# Patient Record
Sex: Male | Born: 1940
Health system: Southern US, Community
[De-identification: ages and names within clinical notes are randomized; demographics above are authoritative.]

## PROBLEM LIST (undated history)

## (undated) DIAGNOSIS — I10 Essential (primary) hypertension: Secondary | ICD-10-CM

## (undated) DIAGNOSIS — K635 Polyp of colon: Secondary | ICD-10-CM

## (undated) DIAGNOSIS — I4510 Unspecified right bundle-branch block: Secondary | ICD-10-CM

## (undated) DIAGNOSIS — N189 Chronic kidney disease, unspecified: Secondary | ICD-10-CM

## (undated) DIAGNOSIS — R0989 Other specified symptoms and signs involving the circulatory and respiratory systems: Secondary | ICD-10-CM

## (undated) DIAGNOSIS — I2510 Atherosclerotic heart disease of native coronary artery without angina pectoris: Secondary | ICD-10-CM

## (undated) DIAGNOSIS — H269 Unspecified cataract: Secondary | ICD-10-CM

## (undated) DIAGNOSIS — M1990 Unspecified osteoarthritis, unspecified site: Secondary | ICD-10-CM

## (undated) DIAGNOSIS — C61 Malignant neoplasm of prostate: Secondary | ICD-10-CM

## (undated) DIAGNOSIS — E785 Hyperlipidemia, unspecified: Secondary | ICD-10-CM

## (undated) HISTORY — DX: Unspecified osteoarthritis, unspecified site: M19.90

## (undated) HISTORY — DX: Unspecified cataract: H26.9

## (undated) HISTORY — DX: Polyp of colon: K63.5

## (undated) HISTORY — DX: Essential (primary) hypertension: I10

## (undated) HISTORY — PX: POLYPECTOMY: SHX149

## (undated) HISTORY — DX: Hyperlipidemia, unspecified: E78.5

## (undated) HISTORY — DX: Atherosclerotic heart disease of native coronary artery without angina pectoris: I25.10

## (undated) HISTORY — PX: COLON SURGERY: SHX602

## (undated) HISTORY — DX: Unspecified right bundle-branch block: I45.10

## (undated) HISTORY — PX: COLONOSCOPY: SHX174

## (undated) HISTORY — PX: VASECTOMY: SHX75

---

## 2001-05-09 ENCOUNTER — Encounter (INDEPENDENT_AMBULATORY_CARE_PROVIDER_SITE_OTHER): Payer: Self-pay

## 2001-05-09 ENCOUNTER — Other Ambulatory Visit: Admission: RE | Admit: 2001-05-09 | Discharge: 2001-05-09 | Payer: Self-pay | Admitting: Gastroenterology

## 2002-03-28 ENCOUNTER — Encounter: Payer: Self-pay | Admitting: Internal Medicine

## 2002-03-28 ENCOUNTER — Encounter: Admission: RE | Admit: 2002-03-28 | Discharge: 2002-03-28 | Payer: Self-pay | Admitting: Internal Medicine

## 2003-11-24 HISTORY — PX: INGUINAL HERNIA REPAIR: SUR1180

## 2004-05-14 ENCOUNTER — Ambulatory Visit (HOSPITAL_BASED_OUTPATIENT_CLINIC_OR_DEPARTMENT_OTHER): Admission: RE | Admit: 2004-05-14 | Discharge: 2004-05-14 | Payer: Self-pay | Admitting: General Surgery

## 2004-05-14 ENCOUNTER — Ambulatory Visit (HOSPITAL_COMMUNITY): Admission: RE | Admit: 2004-05-14 | Discharge: 2004-05-14 | Payer: Self-pay | Admitting: General Surgery

## 2005-04-21 ENCOUNTER — Ambulatory Visit: Payer: Self-pay | Admitting: Internal Medicine

## 2005-04-27 ENCOUNTER — Ambulatory Visit: Payer: Self-pay | Admitting: Internal Medicine

## 2006-01-01 ENCOUNTER — Ambulatory Visit: Payer: Self-pay | Admitting: Internal Medicine

## 2006-07-07 ENCOUNTER — Ambulatory Visit: Payer: Self-pay | Admitting: Internal Medicine

## 2006-10-20 ENCOUNTER — Ambulatory Visit: Payer: Self-pay | Admitting: Internal Medicine

## 2007-03-28 ENCOUNTER — Ambulatory Visit: Payer: Self-pay | Admitting: Internal Medicine

## 2007-04-13 ENCOUNTER — Ambulatory Visit: Payer: Self-pay | Admitting: Gastroenterology

## 2007-04-27 ENCOUNTER — Ambulatory Visit: Payer: Self-pay | Admitting: Gastroenterology

## 2007-04-27 ENCOUNTER — Encounter: Payer: Self-pay | Admitting: Gastroenterology

## 2007-07-05 ENCOUNTER — Ambulatory Visit: Payer: Self-pay | Admitting: Gastroenterology

## 2007-07-20 ENCOUNTER — Encounter: Payer: Self-pay | Admitting: Gastroenterology

## 2007-07-20 ENCOUNTER — Ambulatory Visit: Payer: Self-pay | Admitting: Gastroenterology

## 2007-08-09 ENCOUNTER — Ambulatory Visit: Payer: Self-pay | Admitting: Internal Medicine

## 2007-08-09 LAB — CONVERTED CEMR LAB
ALT: 21 units/L (ref 0–53)
AST: 26 units/L (ref 0–37)
Albumin: 4 g/dL (ref 3.5–5.2)
Alkaline Phosphatase: 61 units/L (ref 39–117)
BUN: 23 mg/dL (ref 6–23)
Basophils Absolute: 0 10*3/uL (ref 0.0–0.1)
Basophils Relative: 0.5 % (ref 0.0–1.0)
Bilirubin Urine: NEGATIVE
Bilirubin, Direct: 0.1 mg/dL (ref 0.0–0.3)
CO2: 30 meq/L (ref 19–32)
Calcium: 9.6 mg/dL (ref 8.4–10.5)
Chloride: 108 meq/L (ref 96–112)
Cholesterol: 207 mg/dL (ref 0–200)
Creatinine, Ser: 1.1 mg/dL (ref 0.4–1.5)
Direct LDL: 114.4 mg/dL
Eosinophils Absolute: 0.2 10*3/uL (ref 0.0–0.6)
Eosinophils Relative: 2.2 % (ref 0.0–5.0)
GFR calc Af Amer: 86 mL/min
GFR calc non Af Amer: 71 mL/min
Glucose, Bld: 88 mg/dL (ref 70–99)
HCT: 39.8 % (ref 39.0–52.0)
HDL: 73.3 mg/dL (ref >39.0)
Hemoglobin, Urine: NEGATIVE
Hemoglobin: 13.8 g/dL (ref 13.0–17.0)
Ketones, ur: NEGATIVE mg/dL
Leukocytes, UA: NEGATIVE
Lymphocytes Relative: 20.3 % (ref 12.0–46.0)
MCHC: 34.6 g/dL (ref 30.0–36.0)
MCV: 92.7 fL (ref 78.0–100.0)
Monocytes Absolute: 0.6 10*3/uL (ref 0.2–0.7)
Monocytes Relative: 8.2 % (ref 3.0–11.0)
Neutro Abs: 5.5 10*3/uL (ref 1.4–7.7)
Neutrophils Relative %: 68.8 % (ref 43.0–77.0)
Nitrite: NEGATIVE
PSA: 1.44 ng/mL (ref 0.10–4.00)
Platelets: 221 10*3/uL (ref 150–400)
Potassium: 4.6 meq/L (ref 3.5–5.1)
RBC: 4.29 M/uL (ref 4.22–5.81)
RDW: 11.6 % (ref 11.5–14.6)
Sodium: 144 meq/L (ref 135–145)
Specific Gravity, Urine: >=1.03 (ref 1.000–1.03)
TSH: 0.5 microintl units/mL (ref 0.35–5.50)
Total Bilirubin: 1 mg/dL (ref 0.3–1.2)
Total CHOL/HDL Ratio: 2.8
Total Protein, Urine: NEGATIVE mg/dL
Total Protein: 7.1 g/dL (ref 6.0–8.3)
Triglycerides: 51 mg/dL (ref 0–149)
Urine Glucose: NEGATIVE mg/dL
Urobilinogen, UA: 0.2 (ref 0.0–1.0)
VLDL: 10 mg/dL (ref 0–40)
Vit D, 1,25-Dihydroxy: 53 (ref 20–57)
WBC: 7.9 10*3/uL (ref 4.5–10.5)
pH: 5.5 (ref 5.0–8.0)

## 2007-12-13 ENCOUNTER — Ambulatory Visit: Payer: Self-pay | Admitting: Gastroenterology

## 2007-12-26 ENCOUNTER — Encounter: Payer: Self-pay | Admitting: Gastroenterology

## 2007-12-26 ENCOUNTER — Ambulatory Visit: Payer: Self-pay | Admitting: Gastroenterology

## 2008-08-09 ENCOUNTER — Ambulatory Visit: Payer: Self-pay | Admitting: Internal Medicine

## 2008-08-09 DIAGNOSIS — I10 Essential (primary) hypertension: Secondary | ICD-10-CM

## 2008-08-09 DIAGNOSIS — E785 Hyperlipidemia, unspecified: Secondary | ICD-10-CM

## 2008-10-11 ENCOUNTER — Telehealth: Payer: Self-pay | Admitting: Gastroenterology

## 2008-12-13 ENCOUNTER — Ambulatory Visit: Payer: Self-pay | Admitting: Gastroenterology

## 2008-12-26 ENCOUNTER — Ambulatory Visit: Payer: Self-pay | Admitting: Gastroenterology

## 2008-12-26 ENCOUNTER — Encounter: Payer: Self-pay | Admitting: Gastroenterology

## 2008-12-26 LAB — HM COLONOSCOPY

## 2008-12-27 ENCOUNTER — Encounter: Payer: Self-pay | Admitting: Gastroenterology

## 2009-02-05 ENCOUNTER — Ambulatory Visit: Payer: Self-pay | Admitting: Internal Medicine

## 2009-02-05 DIAGNOSIS — M79609 Pain in unspecified limb: Secondary | ICD-10-CM

## 2009-08-20 ENCOUNTER — Ambulatory Visit: Payer: Self-pay | Admitting: Internal Medicine

## 2009-08-20 DIAGNOSIS — N509 Disorder of male genital organs, unspecified: Secondary | ICD-10-CM | POA: Insufficient documentation

## 2009-09-09 ENCOUNTER — Telehealth: Payer: Self-pay | Admitting: Internal Medicine

## 2009-09-19 ENCOUNTER — Ambulatory Visit: Payer: Self-pay | Admitting: Internal Medicine

## 2009-09-19 LAB — CONVERTED CEMR LAB
BUN: 15 mg/dL (ref 6–23)
CO2: 29 meq/L (ref 19–32)
Calcium: 9.1 mg/dL (ref 8.4–10.5)
Chloride: 101 meq/L (ref 96–112)
Creatinine, Ser: 1.2 mg/dL (ref 0.4–1.5)
GFR calc non Af Amer: 63.94 mL/min (ref >60)
Glucose, Bld: 90 mg/dL (ref 70–99)
Potassium: 4.6 meq/L (ref 3.5–5.1)
Sodium: 136 meq/L (ref 135–145)
Vit D, 25-Hydroxy: 46 ng/mL (ref 30–89)

## 2010-02-18 ENCOUNTER — Ambulatory Visit: Payer: Self-pay | Admitting: Internal Medicine

## 2010-02-18 DIAGNOSIS — M1990 Unspecified osteoarthritis, unspecified site: Secondary | ICD-10-CM

## 2010-02-18 DIAGNOSIS — Z87891 Personal history of nicotine dependence: Secondary | ICD-10-CM

## 2010-08-21 ENCOUNTER — Ambulatory Visit: Payer: Self-pay | Admitting: Internal Medicine

## 2010-08-21 ENCOUNTER — Encounter: Payer: Self-pay | Admitting: Internal Medicine

## 2010-08-21 DIAGNOSIS — D485 Neoplasm of uncertain behavior of skin: Secondary | ICD-10-CM

## 2010-09-04 ENCOUNTER — Ambulatory Visit: Payer: Self-pay | Admitting: Internal Medicine

## 2010-09-04 LAB — CONVERTED CEMR LAB
ALT: 25 units/L (ref 0–53)
AST: 29 units/L (ref 0–37)
Albumin: 4.1 g/dL (ref 3.5–5.2)
Alkaline Phosphatase: 67 units/L (ref 39–117)
BUN: 24 mg/dL — ABNORMAL HIGH (ref 6–23)
Basophils Absolute: 0 10*3/uL (ref 0.0–0.1)
Basophils Relative: 0.2 % (ref 0.0–3.0)
Bilirubin Urine: NEGATIVE
Bilirubin, Direct: 0.1 mg/dL (ref 0.0–0.3)
CO2: 31 meq/L (ref 19–32)
Calcium: 9.7 mg/dL (ref 8.4–10.5)
Chloride: 104 meq/L (ref 96–112)
Cholesterol: 192 mg/dL (ref 0–200)
Creatinine, Ser: 1.4 mg/dL (ref 0.4–1.5)
Eosinophils Absolute: 0.1 10*3/uL (ref 0.0–0.7)
Eosinophils Relative: 1.8 % (ref 0.0–5.0)
GFR calc non Af Amer: 51.67 mL/min (ref >60)
Glucose, Bld: 94 mg/dL (ref 70–99)
HCT: 41 % (ref 39.0–52.0)
HDL: 83.1 mg/dL (ref >39.00)
Hemoglobin, Urine: NEGATIVE
Hemoglobin: 14.1 g/dL (ref 13.0–17.0)
Ketones, ur: NEGATIVE mg/dL
LDL Cholesterol: 101 mg/dL — ABNORMAL HIGH (ref 0–99)
Leukocytes, UA: NEGATIVE
Lymphocytes Relative: 18.5 % (ref 12.0–46.0)
Lymphs Abs: 1.4 10*3/uL (ref 0.7–4.0)
MCHC: 34.5 g/dL (ref 30.0–36.0)
MCV: 95 fL (ref 78.0–100.0)
Monocytes Absolute: 0.6 10*3/uL (ref 0.1–1.0)
Monocytes Relative: 8.6 % (ref 3.0–12.0)
Neutro Abs: 5.3 10*3/uL (ref 1.4–7.7)
Neutrophils Relative %: 70.9 % (ref 43.0–77.0)
Nitrite: NEGATIVE
PSA: 2.48 ng/mL (ref 0.10–4.00)
Platelets: 184 10*3/uL (ref 150.0–400.0)
Potassium: 5.6 meq/L — ABNORMAL HIGH (ref 3.5–5.1)
RBC: 4.32 M/uL (ref 4.22–5.81)
RDW: 12.7 % (ref 11.5–14.6)
Sodium: 139 meq/L (ref 135–145)
Specific Gravity, Urine: 1.02 (ref 1.000–1.030)
TSH: 0.52 microintl units/mL (ref 0.35–5.50)
Total Bilirubin: 0.7 mg/dL (ref 0.3–1.2)
Total CHOL/HDL Ratio: 2
Total Protein, Urine: NEGATIVE mg/dL
Total Protein: 6.9 g/dL (ref 6.0–8.3)
Triglycerides: 39 mg/dL (ref 0.0–149.0)
Urine Glucose: NEGATIVE mg/dL
Urobilinogen, UA: 0.2 (ref 0.0–1.0)
VLDL: 7.8 mg/dL (ref 0.0–40.0)
WBC: 7.5 10*3/uL (ref 4.5–10.5)
pH: 5.5 (ref 5.0–8.0)

## 2010-12-21 LAB — CONVERTED CEMR LAB
ALT: 22 units/L (ref 0–53)
ALT: 23 units/L (ref 0–53)
AST: 29 units/L (ref 0–37)
AST: 30 units/L (ref 0–37)
Albumin: 3.9 g/dL (ref 3.5–5.2)
Albumin: 4 g/dL (ref 3.5–5.2)
Alkaline Phosphatase: 65 units/L (ref 39–117)
Alkaline Phosphatase: 66 units/L (ref 39–117)
BUN: 17 mg/dL (ref 6–23)
BUN: 18 mg/dL (ref 6–23)
Basophils Absolute: 0 10*3/uL (ref 0.0–0.1)
Basophils Relative: 0 % (ref 0.0–3.0)
Basophils Relative: 0.7 % (ref 0.0–3.0)
Bilirubin Urine: NEGATIVE
Bilirubin Urine: NEGATIVE
Bilirubin, Direct: 0.1 mg/dL (ref 0.0–0.3)
Bilirubin, Direct: 0.2 mg/dL (ref 0.0–0.3)
CO2: 28 meq/L (ref 19–32)
CO2: 32 meq/L (ref 19–32)
Calcium: 9.6 mg/dL (ref 8.4–10.5)
Calcium: 9.6 mg/dL (ref 8.4–10.5)
Chloride: 105 meq/L (ref 96–112)
Chloride: 109 meq/L (ref 96–112)
Cholesterol: 190 mg/dL (ref 0–200)
Cholesterol: 192 mg/dL (ref 0–200)
Creatinine, Ser: 1.1 mg/dL (ref 0.4–1.5)
Creatinine, Ser: 1.2 mg/dL (ref 0.4–1.5)
Eosinophils Absolute: 0.2 10*3/uL (ref 0.0–0.7)
Eosinophils Relative: 2.7 % (ref 0.0–5.0)
Eosinophils Relative: 4.4 % (ref 0.0–5.0)
GFR calc Af Amer: 86 mL/min
GFR calc non Af Amer: 63.96 mL/min (ref >60)
GFR calc non Af Amer: 71 mL/min
Glucose, Bld: 101 mg/dL — ABNORMAL HIGH (ref 70–99)
Glucose, Bld: 103 mg/dL — ABNORMAL HIGH (ref 70–99)
HCT: 40.3 % (ref 39.0–52.0)
HCT: 40.9 % (ref 39.0–52.0)
HDL: 74.9 mg/dL (ref >39.00)
HDL: 75.6 mg/dL (ref >39.0)
Hemoglobin, Urine: NEGATIVE
Hemoglobin, Urine: NEGATIVE
Hemoglobin: 13.7 g/dL (ref 13.0–17.0)
Hemoglobin: 14.3 g/dL (ref 13.0–17.0)
Ketones, ur: NEGATIVE mg/dL
Ketones, ur: NEGATIVE mg/dL
LDL Cholesterol: 106 mg/dL — ABNORMAL HIGH (ref 0–99)
LDL Cholesterol: 108 mg/dL — ABNORMAL HIGH (ref 0–99)
Leukocytes, UA: NEGATIVE
Leukocytes, UA: NEGATIVE
Lymphocytes Relative: 19.9 % (ref 12.0–46.0)
Lymphocytes Relative: 20.3 % (ref 12.0–46.0)
MCHC: 34 g/dL (ref 30.0–36.0)
MCHC: 34.9 g/dL (ref 30.0–36.0)
MCV: 93.7 fL (ref 78.0–100.0)
MCV: 94 fL (ref 78.0–100.0)
Monocytes Absolute: 0.5 10*3/uL (ref 0.1–1.0)
Monocytes Relative: 11.1 % (ref 3.0–12.0)
Monocytes Relative: 6.8 % (ref 3.0–12.0)
Neutro Abs: 4.8 10*3/uL (ref 1.4–7.7)
Neutrophils Relative %: 63.9 % (ref 43.0–77.0)
Neutrophils Relative %: 70.2 % (ref 43.0–77.0)
Nitrite: NEGATIVE
Nitrite: NEGATIVE
PSA: 1.69 ng/mL (ref 0.10–4.00)
PSA: 1.76 ng/mL (ref 0.10–4.00)
Platelets: 182 10*3/uL (ref 150.0–400.0)
Platelets: 182 10*3/uL (ref 150–400)
Potassium: 4.8 meq/L (ref 3.5–5.1)
Potassium: 5.3 meq/L — ABNORMAL HIGH (ref 3.5–5.1)
RBC: 4.29 M/uL (ref 4.22–5.81)
RBC: 4.37 M/uL (ref 4.22–5.81)
RDW: 11.9 % (ref 11.5–14.6)
RDW: 12.2 % (ref 11.5–14.6)
Sodium: 140 meq/L (ref 135–145)
Sodium: 143 meq/L (ref 135–145)
Specific Gravity, Urine: 1.02 (ref 1.000–1.030)
Specific Gravity, Urine: 1.025 (ref 1.000–1.03)
TSH: 0.55 microintl units/mL (ref 0.35–5.50)
TSH: 0.6 microintl units/mL (ref 0.35–5.50)
Total Bilirubin: 0.8 mg/dL (ref 0.3–1.2)
Total Bilirubin: 1 mg/dL (ref 0.3–1.2)
Total CHOL/HDL Ratio: 2.5
Total CHOL/HDL Ratio: 3
Total Protein, Urine: NEGATIVE mg/dL
Total Protein, Urine: NEGATIVE mg/dL
Total Protein: 7.2 g/dL (ref 6.0–8.3)
Total Protein: 7.3 g/dL (ref 6.0–8.3)
Triglycerides: 43 mg/dL (ref 0–149)
Triglycerides: 47 mg/dL (ref 0.0–149.0)
Urine Glucose: NEGATIVE mg/dL
Urine Glucose: NEGATIVE mg/dL
Urobilinogen, UA: 0.2 (ref 0.0–1.0)
Urobilinogen, UA: 0.2 (ref 0.0–1.0)
VLDL: 9 mg/dL (ref 0–40)
VLDL: 9.4 mg/dL (ref 0.0–40.0)
WBC: 6.3 10*3/uL (ref 4.5–10.5)
WBC: 6.9 10*3/uL (ref 4.5–10.5)
pH: 5 (ref 5.0–8.0)
pH: 5 (ref 5.0–8.0)

## 2010-12-23 NOTE — Miscellaneous (Signed)
Summary: Skin Bx & mole removal/[facility]  Skin Bx & mole removal/[facility]   Imported By: Phillis Knack 09/05/2010 14:06:51  _____________________________________________________________________  External Attachment:    Type:   Image     Comment:   External Document

## 2010-12-23 NOTE — Assessment & Plan Note (Signed)
Summary: SKIN BX/NWS   Vital Signs:  Patient profile:   70 year old male Height:      65 inches Weight:      145 pounds BMI:     24.22 Temp:     97.5 degrees F oral Pulse rate:   72 / minute Pulse rhythm:   regular Resp:     16 per minute BP sitting:   102 / 70  (left arm) Cuff size:   regular  Vitals Entered By: Jonathon Resides, Gabrielle Dare) (September 04, 2010 9:29 AM)  Procedure Note Last Tetanus: Tdap (08/21/2010)  Mole Biopsy/Removal: The patient complains of irritation and inflammation. Indication: changing lesion Consent signed: yes  Procedure # 1: shave biopsy    Size (in cm): 0.6 x 0.5    Region: lateral    Location: L temple (#3)    Comment: Risks including but not limited by incomplete procedure, bleeding, infection, recurrence were discussed with the patient. Consent form was signed.     Instrument used: dermablade    Anesthesia: 0.5 ml 1% lidocaine w/epinephrine  Procedure # 2: shave biopsy    Size (in cm): 1.1 x 0.6    Region: lateral    Location: R chest (#1)    Comment: Another three 6x4 mm moles were removed from R chest in the identical fasion. Tolerated well. Complicatons - none. #1,2,3  sent to lab. #2 was above #1    Instrument used: same    Anesthesia: 1.0 ml 1% lidocaine w/epinephrine  Cleaned and prepped with: alcohol and betadine Wound dressing: neosporin and bandaid Instructions: daily dressing changes  CC: multiple moles Is Patient Diabetic? No   CC:  multiple moles.  History of Present Illness: Skin biopsy   Current Medications (verified): 1)  Lisinopril 20 Mg Tabs (Lisinopril) .... Once Daily 2)  Lovastatin 40 Mg Tabs (Lovastatin) .... Once Daily 3)  Aspirin 81 Mg  Tbec (Aspirin) .... One By Mouth Every Day 4)  Vitamin D 2000 Unit Caps (Cholecalciferol) .Marland Kitchen.. 1 By Mouth Once Daily 5)  Cialis 20 Mg Tabs (Tadalafil) .Marland Kitchen.. 1 Tablet Every Other Day As Needed 6)  Fish Oil  Oil (Fish Oil) .... Once Daily  Allergies (verified): 1)  !  Wellbutrin   Impression & Recommendations:  Problem # 1:  NEOPLASM OF UNCERTAIN BEHAVIOR OF SKIN (ICD-238.2) Assessment Deteriorated  Orders: Shave Skin Lesion 0.6-1.0cm face/ears/eyelids/nose/lips/mm (11311) Shave Skin Lesion 1.1-2.0 cm/trunk/arm/leg [id number]) Shave Skin Lesion 0.6-1.0 cm/trunk/arm/leg (11301) Shave Skin Lesion 0.6-1.0 cm/trunk/arm/leg (11301) Shave Skin Lesion 0.6-1.0 cm/trunk/arm/leg (11301)  Complete Medication List: 1)  Lisinopril 20 Mg Tabs (Lisinopril) .... Once daily 2)  Lovastatin 40 Mg Tabs (Lovastatin) .... Once daily 3)  Aspirin 81 Mg Tbec (Aspirin) .... One by mouth every day 4)  Vitamin D 2000 Unit Caps (Cholecalciferol) .Marland Kitchen.. 1 by mouth once daily 5)  Cialis 20 Mg Tabs (Tadalafil) .Marland Kitchen.. 1 tablet every other day as needed 6)  Fish Oil Oil (Fish oil) .... Once daily  Other Orders: Pneumococcal Vaccine [id number]) Admin 1st Vaccine [phone])   Immunizations Administered:  Pneumonia Vaccine:    Vaccine Type: Pneumovax    Site: right deltoid    Mfr: Merck    Dose: 0.5 ml    Route: IM    Given by: Jonathon Resides, CMA(AAMA)    Exp. Date: 02/09/2012    Lot #: [id number]    VIS given: 10/28/09 version given September 04, 2010.

## 2010-12-23 NOTE — Assessment & Plan Note (Signed)
Summary: 6 MTH FU  #--STC   Vital Signs:  Patient profile:   70 year old male Height:      65 inches Weight:      148 pounds BMI:     24.72 Temp:     96.4 degrees F oral Pulse rate:   58 / minute BP sitting:   116 / 74  (left arm)  Vitals Entered By: Doralee Albino (February 18, 2010 8:58 AM) CC: f/u Is Patient Diabetic? No   CC:  f/u.  History of Present Illness: F/u HTN, hyperlipidemia, ED. Knee pain is better. Testic pain has reoccured - mild - several times.  Preventive Screening-Counseling & Management  Alcohol-Tobacco     Smoking Status: quit  Current Medications (verified): 1)  Lisinopril 20 Mg Tabs (Lisinopril) .... Once Daily 2)  Lovastatin 40 Mg Tabs (Lovastatin) .... Once Daily 3)  Aspirin 81 Mg  Tbec (Aspirin) .... One By Mouth Every Day 4)  Vitamin D3 1000 Unit  Tabs (Cholecalciferol) .Marland Kitchen.. 1 By Mouth Daily 5)  Cialis 20 Mg Tabs (Tadalafil) .Marland Kitchen.. 1 Tablet Every Other Day As Needed 6)  Fish Oil  Oil (Fish Oil) .... Once Daily  Allergies: 1)  ! Wellbutrin  Past History:  Social History: Last updated: 08/20/2009 Married Former Smoker Alcohol use-yes Regular exercise-yes - golf  Past Medical History: Hyperlipidemia Hypertension Osteoarthritis  Review of Systems  The patient denies fever, chest pain, and abdominal pain.    Physical Exam  General:  Well-developed,well-nourished,in no acute distress; alert,appropriate and cooperative throughout examination Nose:  External nasal examination shows no deformity or inflammation. Nasal mucosa are pink and moist without lesions or exudates. Mouth:  Oral mucosa and oropharynx without lesions or exudates.  Teeth in good repair. Neck:  No deformities, masses, or tenderness noted. Lungs:  Normal respiratory effort, chest expands symmetrically. Lungs are clear to auscultation, no crackles or wheezes. Heart:  Normal rate and regular rhythm. S1 and S2 normal without gallop, murmur, click, rub or other extra  sounds. Abdomen:  Bowel sounds positive,abdomen soft and non-tender without masses, organomegaly. Mild R inguinal  hernia noted. Msk:  No deformity or scoliosis noted of thoracic or lumbar spine.   Neurologic:  No cranial nerve deficits noted. Station and gait are normal. Plantar reflexes are down-going bilaterally. DTRs are symmetrical throughout. Sensory, motor and coordinative functions appear intact. Psych:  Cognition and judgment appear intact. Alert and cooperative with normal attention span and concentration. No apparent delusions, illusions, hallucinations   Impression & Recommendations:  Problem # 1:  HYPERTENSION (ICD-401.9) Assessment Improved  His updated medication list for this problem includes:    Lisinopril 20 Mg Tabs (Lisinopril) ..... Once daily  Problem # 2:  HYPERLIPIDEMIA (ICD-272.4) Assessment: Improved  His updated medication list for this problem includes:    Lovastatin 40 Mg Tabs (Lovastatin) ..... Once daily  Problem # 3:  TESTICULAR PAIN, RIGHT (ICD-608.9) ?? epididimitis Assessment: Improved Cipro if reccures  Complete Medication List: 1)  Lisinopril 20 Mg Tabs (Lisinopril) .... Once daily 2)  Lovastatin 40 Mg Tabs (Lovastatin) .... Once daily 3)  Aspirin 81 Mg Tbec (Aspirin) .... One by mouth every day 4)  Vitamin D3 1000 Unit Tabs (Cholecalciferol) .Marland Kitchen.. 1 by mouth daily 5)  Cialis 20 Mg Tabs (Tadalafil) .Marland Kitchen.. 1 tablet every other day as needed 6)  Fish Oil Oil (Fish oil) .... Once daily 7)  Ciprofloxacin Hcl 500 Mg Tabs (Ciprofloxacin hcl) .Marland Kitchen.. 1 by mouth bid  Patient Instructions: 1)  Please  schedule a follow-up appointment in 6 months well w/labs. Prescriptions: CIPROFLOXACIN HCL 500 MG TABS (CIPROFLOXACIN HCL) 1 by mouth bid  #20 x 0   Entered and Authorized by:   Cassandria Anger MD   Signed by:   Cassandria Anger MD on 02/18/2010   Method used:   Print then Give to Patient   RxID:   [id number]

## 2010-12-23 NOTE — Assessment & Plan Note (Signed)
Summary: 6 MTH YEARLY--STC   Vital Signs:  Patient profile:   70 year old male Height:      65 inches Weight:      142.50 pounds O2 Sat:      99 % on Room air Temp:     96.6 degrees F oral Pulse rate:   65 / minute BP sitting:   112 / 70  (left arm)  Vitals Entered By: patty berrocal/intern  O2 Flow:  Room air CC: physical   CC:  physical.  History of Present Illness: The patient presents for a preventive health examination  C/o moles on R chest and L temple dog is waking him up 10-15/night wife thinks he could have OSA Overall doing well, age appropriate education and counseling updated and referral for appropriate preventive services done unless declined, immunizations up to date or declined, diet counseling done if overweight, urged to quit smoking if smokes, most recent labs reviewed and current ordered if appropriate, ecg reviewed or declined (interpretation per ECG scanned in the EMR if done); information regarding Medicare Preventation requirements given if appropriate.   Preventive Screening-Counseling & Management  Alcohol-Tobacco     Alcohol drinks/day: 0  Caffeine-Diet-Exercise     Caffeine Counseling: not indicated; caffeine use is not excessive or problematic     Diet Counseling: not indicated; diet is assessed to be healthy     Exercise Counseling: not indicated; exercise is adequate     Depression Counseling: not indicated; screening negative for depression  Hep-HIV-STD-Contraception     Hepatitis Risk: no risk noted     Sun Exposure-Excessive: no  Safety-Violence-Falls     Seat Belt Use: yes      Sexual History:  currently monogamous.    Allergies: 1)  ! Wellbutrin  Past History:  Past Medical History: Last updated: 02/18/2010 Hyperlipidemia Hypertension Osteoarthritis  Past Surgical History: Last updated: 08/20/2009 Inguinal herniorrhaphy R 2005  Family History: Last updated: 08/09/2008 Family History Hypertension  Social  History: Last updated: 08/20/2009 Married Former Smoker Alcohol use-yes Regular exercise-yes - golf  Social History: Hepatitis Risk:  no risk noted Sun Exposure-Excessive:  no Seat Belt Use:  yes Sexual History:  currently monogamous  Review of Systems       The patient complains of suspicious skin lesions.  The patient denies anorexia, fever, weight loss, weight gain, vision loss, decreased hearing, hoarseness, chest pain, syncope, dyspnea on exertion, peripheral edema, prolonged cough, headaches, hemoptysis, abdominal pain, melena, hematochezia, severe indigestion/heartburn, hematuria, incontinence, genital sores, muscle weakness, transient blindness, difficulty walking, depression, unusual weight change, abnormal bleeding, enlarged lymph nodes, angioedema, and testicular masses.    Physical Exam  General:  Well-developed,well-nourished,in no acute distress; alert,appropriate and cooperative throughout examination Head:  Normocephalic and atraumatic without obvious abnormalities. No apparent alopecia or balding. Eyes:  No corneal or conjunctival inflammation noted. EOMI. Perrla. Funduscopic exam benign, without hemorrhages, exudates or papilledema. Vision grossly normal. Ears:  External ear exam shows no significant lesions or deformities.  Otoscopic examination reveals clear canals, tympanic membranes are intact bilaterally without bulging, retraction, inflammation or discharge. Hearing is grossly normal bilaterally. Nose:  External nasal examination shows no deformity or inflammation. Nasal mucosa are pink and moist without lesions or exudates. Mouth:  Oral mucosa and oropharynx without lesions or exudates.  Teeth in good repair. Neck:  No deformities, masses, or tenderness noted. Chest Wall:  No deformities, masses, tenderness or gynecomastia noted. Lungs:  Normal respiratory effort, chest expands symmetrically. Lungs are clear to auscultation, no  crackles or wheezes. Heart:  Normal  rate and regular rhythm. S1 and S2 normal without gallop, murmur, click, rub or other extra sounds. Abdomen:  Bowel sounds positive,abdomen soft and non-tender without masses, organomegaly. Mild R inguinal  hernia noted. Rectal:  No external abnormalities noted. Normal sphincter tone. No rectal masses or tenderness. G(-) Genitalia:  Testes bilaterally descended without nodularity, tenderness or masses. No scrotal masses or lesions. No penis lesions or urethral discharge. Prostate:  1+ enlarged R side is bigger; no nodules.   Msk:  No deformity or scoliosis noted of thoracic or lumbar spine.   Extremities:  No clubbing, cyanosis, edema, or deformity noted with normal full range of motion of all joints.   Neurologic:  No cranial nerve deficits noted. Station and gait are normal. Plantar reflexes are down-going bilaterally. DTRs are symmetrical throughout. Sensory, motor and coordinative functions appear intact. Skin:  3 moles on R chest and 1 on L temple Cervical Nodes:  No lymphadenopathy noted Psych:  Cognition and judgment appear intact. Alert and cooperative with normal attention span and concentration. No apparent delusions, illusions, hallucinations   Impression & Recommendations:  Problem # 1:  WELL ADULT EXAM (ICD-V70.0) Assessment New Colon due next year Orders: TLB-BMP (Basic Metabolic Panel-BMET) (99991111) TLB-CBC Platelet - w/Differential (85025-CBCD) TLB-Hepatic/Liver Function Pnl (80076-HEPATIC) TLB-Lipid Panel (80061-LIPID) TLB-TSH (Thyroid Stimulating Hormone) (84443-TSH) TLB-Udip ONLY (81003-UDIP) TLB-PSA (Prostate Specific Antigen) (84153-PSA) Sleep test offered Overall doing well, age appropriate education and counseling updated and referral for appropriate preventive services done unless declined, immunizations up to date or declined, diet counseling done if overweight, urged to quit smoking if smokes, most recent labs reviewed and current ordered if appropriate,  ecg reviewed or declined (interpretation per ECG scanned in the EMR if done); information regarding Medicare Preventation requirements given if appropriate.   Problem # 2:  NEOPLASM OF UNCERTAIN BEHAVIOR OF SKIN (ICD-238.2) R chest x3 and L temple x1 Assessment: New Skin biopsy to sch  Problem # 3:  HYPERLIPIDEMIA (ICD-272.4) Assessment: Comment Only  His updated medication list for this problem includes:    Lovastatin 40 Mg Tabs (Lovastatin) ..... Once daily  Complete Medication List: 1)  Lisinopril 20 Mg Tabs (Lisinopril) .... Once daily 2)  Lovastatin 40 Mg Tabs (Lovastatin) .... Once daily 3)  Aspirin 81 Mg Tbec (Aspirin) .... One by mouth every day 4)  Vitamin D3 1000 Unit Tabs (Cholecalciferol) .Marland Kitchen.. 1 by mouth daily 5)  Cialis 20 Mg Tabs (Tadalafil) .Marland Kitchen.. 1 tablet every other day as needed 6)  Fish Oil Oil (Fish oil) .... Once daily  Other Orders: Flu Vaccine 61yrs + MEDICARE PATIENTS [id number]) Administration Flu vaccine - MCR (G0008) Tdap => 25yrs IM [id number]) Admin 1st Vaccine [id number])  Flu Vaccine 30yrs + MEDICARE PATIENTS [id number]) Administration Flu vaccine - MCR [id number])  Patient Instructions: 1)  Sch skin biopsy w/me 2)  Please schedule a follow-up appointment in 6 months. Prescriptions: CIALIS 20 MG TABS (TADALAFIL) 1 tablet every other day as needed  #12 x 6   Entered and Authorized by:   Cassandria Anger MD   Signed by:   Cassandria Anger MD on 08/21/2010   Method used:   Print then Give to Patient   RxID:   [id number] LOVASTATIN 40 MG TABS (LOVASTATIN) once daily  #90 x 3   Entered and Authorized by:   Cassandria Anger MD   Signed by:   Cassandria Anger MD on 08/21/2010   Method used:   Print then  Give to Patient   RxID:   [id number] LISINOPRIL 20 MG TABS (LISINOPRIL) once daily  #90 x 3   Entered and Authorized by:   Cassandria Anger MD   Signed by:   Cassandria Anger MD on 08/21/2010   Method used:   Print then Give to Patient    [id number]  .lbmedflu     Immunizations Administered:  Tetanus Vaccine:    Vaccine Type: Tdap    Site: left deltoid    Mfr: GlaxoSmithKline    Dose: 0.5 ml    Route: IM    Given by: Jonathon Resides, CMA(AAMA)    Exp. Date: 08/13/2012    Lot #: [id number]    VIS given: 10/10/08 version given August 21, 2010. Flu Vaccine Consent Questions     Do you have a history of severe allergic reactions to this vaccine? no    Any prior history of allergic reactions to egg and/or gelatin? no    Do you have a sensitivity to the preservative Thimersol? no    Do you have a past history of Guillan-Barre Syndrome? no    Do you currently have an acute febrile illness? no    Have you ever had a severe reaction to latex? no    Vaccine information given and explained to patient? yes    Are you currently pregnant? no    Lot Number:[id number]   Exp Date:05/23/2011   Site Given  Right Deltoid IM Jonathon Resides, [facility])  August 21, 2010 9:19 AM

## 2011-01-14 ENCOUNTER — Ambulatory Visit (INDEPENDENT_AMBULATORY_CARE_PROVIDER_SITE_OTHER)
Admission: RE | Admit: 2011-01-14 | Discharge: 2011-01-14 | Disposition: A | Discharge status: Home / Self Care | Payer: Medicare HMO | Source: Ambulatory Visit | Attending: Internal Medicine | Admitting: Internal Medicine

## 2011-01-14 ENCOUNTER — Encounter: Payer: Self-pay | Admitting: Internal Medicine

## 2011-01-14 ENCOUNTER — Other Ambulatory Visit: Payer: Self-pay | Admitting: Internal Medicine

## 2011-01-14 ENCOUNTER — Ambulatory Visit (INDEPENDENT_AMBULATORY_CARE_PROVIDER_SITE_OTHER): Payer: Medicare HMO | Admitting: Internal Medicine

## 2011-01-14 DIAGNOSIS — E785 Hyperlipidemia, unspecified: Secondary | ICD-10-CM

## 2011-01-14 DIAGNOSIS — I10 Essential (primary) hypertension: Secondary | ICD-10-CM

## 2011-01-14 DIAGNOSIS — J209 Acute bronchitis, unspecified: Secondary | ICD-10-CM

## 2011-01-20 NOTE — Assessment & Plan Note (Signed)
Summary: cold/ cough /nws   Vital Signs:  Patient profile:   70 year old male Height:      65 inches Weight:      147.50 pounds BMI:     24.63 O2 Sat:      98 % on Room air Temp:     97.5 degrees F oral Pulse rate:   72 / minute BP sitting:   138 / 80  (left arm) Cuff size:   regular  Vitals Entered By: Crissie Sickles, CMA (January 14, 2011 4:28 PM)  O2 Flow:  Room air CC: Productive cough x 5 days, no other cold sxs   CC:  Productive cough x 5 days and no other cold sxs.  History of Present Illness: here to f/u - with acute onset midl to mod 3 days fever, HA, general weakness and malaise,  ST , sinus pressure and now prod cough with greenish sputum,  but Pt denies CP, worsening sob, doe, wheezing, orthopnea, pnd, worsening LE edema, palps, dizziness or syncope  Pt denies new neuro symptoms such as headache, facial or extremity weakness  Pt denies polydipsia, polyuria   Overall good compliance with meds, trying to follow low chol diet, wt stable, little excercise however  No recent wt loss, night sweats, loss of appetite or other constitutional symptoms except for with acute symtpoms as above.  Overall good compliance with meds, and good tolerability.   Preventive Screening-Counseling & Management      Drug Use:  no.    Problems Prior to Update: 1)  Bronchitis-acute  (ICD-466.0) 2)  Neoplasm of Uncertain Behavior of Skin  (ICD-238.2) 3)  Osteoarthritis  (ICD-715.90) 4)  Tobacco Use, Quit  (ICD-V15.82) 5)  Need Prophylactic Vaccination&inoculation Flu  (ICD-V04.81) 6)  Need Prophylactic Vaccination&inoculation Flu  (ICD-V04.81) 7)  Testicular Pain, Right  (ICD-608.9) 8)  Hand Pain, Right  (ICD-729.5) 9)  Well Adult Exam  (ICD-V70.0) 10)  Hypertension  (ICD-401.9) 11)  Hyperlipidemia  (ICD-272.4)  Medications Prior to Update: 1)  Lisinopril 20 Mg Tabs (Lisinopril) .... Once Daily 2)  Lovastatin 40 Mg Tabs (Lovastatin) .... Once Daily 3)  Aspirin 81 Mg  Tbec  (Aspirin) .... One By Mouth Every Day 4)  Vitamin D 2000 Unit Caps (Cholecalciferol) .Marland Kitchen.. 1 By Mouth Once Daily 5)  Cialis 20 Mg Tabs (Tadalafil) .Marland Kitchen.. 1 Tablet Every Other Day As Needed 6)  Fish Oil  Oil (Fish Oil) .... Once Daily  Current Medications (verified): 1)  Lisinopril 20 Mg Tabs (Lisinopril) .... Once Daily 2)  Lovastatin 40 Mg Tabs (Lovastatin) .... Once Daily 3)  Aspirin 81 Mg  Tbec (Aspirin) .... One By Mouth Every Day 4)  Vitamin D 2000 Unit Caps (Cholecalciferol) .Marland Kitchen.. 1 By Mouth Once Daily 5)  Cialis 20 Mg Tabs (Tadalafil) .Marland Kitchen.. 1 Tablet Every Other Day As Needed 6)  Fish Oil  Oil (Fish Oil) .... Once Daily 7)  Azithromycin 250 Mg Tabs (Azithromycin) .... 2po Qd For 1 Day, Then 1po Qd For 4days, Then Stop 8)  Hydrocodone-Homatropine 5-1.5 Mg/49ml Syrp (Hydrocodone-Homatropine) .Marland Kitchen.. 1 Tsp By Mouth Q 6 Hrs As Needed  Allergies (verified): 1)  ! Wellbutrin  Past History:  Past Medical History: Last updated: 02/18/2010 Hyperlipidemia Hypertension Osteoarthritis  Past Surgical History: Last updated: 08/20/2009 Inguinal herniorrhaphy R 2005  Social History: Last updated: 01/14/2011 Married Former Smoker Alcohol use-yes Regular exercise-yes - golf Drug use-no  Risk Factors: Alcohol Use: 0 (08/21/2010) Exercise: yes (08/20/2009)  Risk Factors: Smoking Status: quit (02/18/2010)  Social History: Married Former Smoker Alcohol use-yes Regular exercise-yes - golf Drug use-no Drug Use:  no  Review of Systems       all otherwise negative per pt -    Physical Exam  General:  Well-developed,well-nourished,in no acute distress but mild ill appearing Head:  Normocephalic and atraumatic without obvious abnormalities. No apparent alopecia or balding. Eyes:  vision grossly intact, pupils equal, and pupils round.   Ears:  bilat tm;s mild red, sinus nontender Nose:  nasal dischargemucosal pallor and mucosal edema.   Mouth:  pharyngeal erythema and fair dentition.    Neck:  supple and cervical lymphadenopathy.   Lungs:  normal respiratory effort and normal breath sounds.   Heart:  normal rate and regular rhythm.   Extremities:  No clubbing, cyanosis, edema, or deformity noted with normal full range of motion of all joints.     Impression & Recommendations:  Problem # 1:  BRONCHITIS-ACUTE (ICD-466.0)  Orders: T-2 View CXR, Same Day (C9260230.5TC)  His updated medication list for this problem includes:    Azithromycin 250 Mg Tabs (Azithromycin) .Marland Kitchen... 2po qd for 1 day, then 1po qd for 4days, then stop    Hydrocodone-homatropine 5-1.5 Mg/2ml Syrp (Hydrocodone-homatropine) .Marland Kitchen... 1 tsp by mouth q 6 hrs as needed treat as above, f/u any worsening signs or symptoms , cant r/o pna - for cxr today as well   Take antibiotics and other medications as directed. Encouraged to push clear liquids, get enough rest, and take acetaminophen as needed. To be seen in 5-7 days if no improvement, sooner if worse.  Problem # 2:  HYPERTENSION (ICD-401.9)  His updated medication list for this problem includes:    Lisinopril 20 Mg Tabs (Lisinopril) ..... Once daily  BP today: 138/80 Prior BP: 102/70 (09/04/2010)  Labs Reviewed: K+: 5.6 (08/21/2010) Creat: : 1.4 (08/21/2010)   Chol: 192 (08/21/2010)   HDL: 83.10 (08/21/2010)   LDL: 101 (08/21/2010)   TG: 39.0 (08/21/2010) stable overall by hx and exam, ok to continue meds/tx as is   Problem # 3:  HYPERLIPIDEMIA (ICD-272.4)  His updated medication list for this problem includes:    Lovastatin 40 Mg Tabs (Lovastatin) ..... Once daily  Labs Reviewed: SGOT: 29 (08/21/2010)   SGPT: 25 (08/21/2010)   HDL:83.10 (08/21/2010), 74.90 (08/20/2009)  LDL:101 (08/21/2010), 106 (08/20/2009)  Chol:192 (08/21/2010), 190 (08/20/2009)  Trig:39.0 (08/21/2010), 47.0 (08/20/2009) stable overall by hx and exam, ok to continue meds/tx as is , d/w pt - Pt to continue diet efforts, good med tolerance; to check labs next visit- goal LDL less  than 100  Complete Medication List: 1)  Lisinopril 20 Mg Tabs (Lisinopril) .... Once daily 2)  Lovastatin 40 Mg Tabs (Lovastatin) .... Once daily 3)  Aspirin 81 Mg Tbec (Aspirin) .... One by mouth every day 4)  Vitamin D 2000 Unit Caps (Cholecalciferol) .Marland Kitchen.. 1 by mouth once daily 5)  Cialis 20 Mg Tabs (Tadalafil) .Marland Kitchen.. 1 tablet every other day as needed 6)  Fish Oil Oil (Fish oil) .... Once daily 7)  Azithromycin 250 Mg Tabs (Azithromycin) .... 2po qd for 1 day, then 1po qd for 4days, then stop 8)  Hydrocodone-homatropine 5-1.5 Mg/82ml Syrp (Hydrocodone-homatropine) .Marland Kitchen.. 1 tsp by mouth q 6 hrs as needed  Patient Instructions: 1)  Please take all new medications as prescribed 2)  Continue all previous medications as before this visit  3)  Please go to Radiology in the basement level for your X-Ray today  4)  Please call the number  on the [facility] for results of your testing  5)  Please schedule an appointment with your primary doctor as needed Prescriptions: HYDROCODONE-HOMATROPINE 5-1.5 MG/5ML SYRP (HYDROCODONE-HOMATROPINE) 1 tsp by mouth q 6 hrs as needed  #6oz x 1   Entered and Authorized by:   Biagio Borg MD   Signed by:   Biagio Borg MD on 01/14/2011   Method used:   Print then Give to Patient   RxID:   [id number] AZITHROMYCIN 250 MG TABS (AZITHROMYCIN) 2po qd for 1 day, then 1po qd for 4days, then stop  #6 x 1   Entered and Authorized by:   Biagio Borg MD   Signed by:   Biagio Borg MD on 01/14/2011   Method used:   Print then Give to Patient   RxID:   [id number]    Orders Added: 1)  T-2 View CXR, Same Day [71020.5TC] 2)  Est. Patient Level IV [id number]

## 2011-02-19 ENCOUNTER — Encounter: Payer: Self-pay | Admitting: Internal Medicine

## 2011-02-19 ENCOUNTER — Ambulatory Visit (INDEPENDENT_AMBULATORY_CARE_PROVIDER_SITE_OTHER): Payer: Medicare HMO | Admitting: Internal Medicine

## 2011-02-19 DIAGNOSIS — R42 Dizziness and giddiness: Secondary | ICD-10-CM | POA: Insufficient documentation

## 2011-02-19 DIAGNOSIS — E785 Hyperlipidemia, unspecified: Secondary | ICD-10-CM

## 2011-02-19 DIAGNOSIS — I10 Essential (primary) hypertension: Secondary | ICD-10-CM

## 2011-02-19 DIAGNOSIS — N529 Male erectile dysfunction, unspecified: Secondary | ICD-10-CM

## 2011-02-19 NOTE — Progress Notes (Signed)
  Subjective:    Patient ID: Sergio Hughes, male    DOB: [birth year], 70 y.o.   MRN: [id number]  HPI  F/u HTN, hyperlipidemia, ED. C/o lightheadedness when bending over playing golf. He wants to go off Lisinopril due to the above and off Lovastatin due to "controversies" around statins...  Review of Systems  Constitutional: Negative for fever, activity change and appetite change.  HENT: Negative for congestion.   Eyes: Negative for pain.  Respiratory: Negative for cough, chest tightness and shortness of breath.   Cardiovascular: Negative for chest pain.  Genitourinary: Negative for dysuria and difficulty urinating.  Musculoskeletal: Negative for arthralgias.  Neurological: Positive for dizziness (as above). Negative for syncope.  Psychiatric/Behavioral: Negative.        Objective:   Physical Exam  Constitutional: He is oriented to person, place, and time. He appears well-developed.  HENT:  Mouth/Throat: Oropharynx is clear and moist.  Eyes: Conjunctivae are normal. Pupils are equal, round, and reactive to light.  Neck: Normal range of motion. No JVD present. No thyromegaly present.  Cardiovascular: Normal rate, regular rhythm, normal heart sounds and intact distal pulses.  Exam reveals no gallop and no friction rub.   No murmur heard. Pulmonary/Chest: Effort normal and breath sounds normal. No respiratory distress. He has no wheezes. He has no rales. He exhibits no tenderness.  Abdominal: Soft. Bowel sounds are normal. He exhibits no distension and no mass. There is no tenderness. There is no rebound and no guarding.  Musculoskeletal: Normal range of motion. He exhibits no edema and no tenderness.  Lymphadenopathy:    He has no cervical adenopathy.  Neurological: He is alert and oriented to person, place, and time. He has normal reflexes. No cranial nerve deficit. He exhibits normal muscle tone. Coordination normal.  Skin: Skin is warm and dry. No rash noted.  Psychiatric: He has a  normal mood and affect. His behavior is normal. Judgment and thought content normal.          Assessment & Plan:  Erectile dysfunction Cont Rx  Dizziness We will cut Lisinopril in 1/2 or d/c. He can d/c Rx and monitor his BP at home. Restart if BP is up.  HYPERTENSION We may need to switch to Losartan  HYPERLIPIDEMIA He wants to go off Lovastatin that he has been taking for about 7 years. I told him that it would be safe to cont Lovastatin. We will recheck in 3 months.

## 2011-02-19 NOTE — Assessment & Plan Note (Signed)
We will cut Lisinopril in 1/2 or d/c. He can d/c Rx and monitor his BP.

## 2011-02-19 NOTE — Assessment & Plan Note (Signed)
We may need to switch to Losartan

## 2011-02-19 NOTE — Assessment & Plan Note (Signed)
Cont Rx 

## 2011-02-19 NOTE — Assessment & Plan Note (Signed)
He wants to go off Lovastatin.

## 2011-04-10 NOTE — Assessment & Plan Note (Signed)
[facility]                           PRIMARY CARE OFFICE NOTE   KENAY, CONN                         MRN:          [id number]  DATE:03/28/2007                            DOB:          11/01/1941    PROCEDURE:  Carotid surgery.   INDICATION:  Actinic keratosis on the face x2.   The risks and benefits explained to patient in detail, and he agreed to  proceed.  Two lesions, one on the right temple and one on the upper  middle forehead were treated with liquid nitrogen in the usual fashion.  Tolerated well.   COMPLICATIONS:  None.     [name]. Plotnikov, MD  Electronically Signed    AVP/MedQ  DD: 03/28/2007  DT: 03/28/2007  Job #: [id number]

## 2011-04-10 NOTE — Op Note (Signed)
NAME:  Sergio Hughes, Sergio Hughes                         ACCOUNT NO.:  [id number]   MEDICAL RECORD NO.:  [id number]                   PATIENT TYPE:  AMB   LOCATION:  DSC                                  FACILITY:  [facility]   PHYSICIAN:  Edsel Petrin. Dalbert Batman, M.D.             DATE OF BIRTH:  July 10, 1941   DATE OF PROCEDURE:  05/14/2004  DATE OF DISCHARGE:                                 OPERATIVE REPORT   PREOPERATIVE DIAGNOSIS:  Right inguinal hernia.   POSTOPERATIVE DIAGNOSIS:  Right inguinal hernia.   OPERATION PERFORMED:  Repair right inguinal hernia with mesh Karl Pock  repair).   SURGEON:  Edsel Petrin. Dalbert Batman, M.D.   ANESTHESIA:  General.   INDICATIONS FOR PROCEDURE:  This is a healthy 70 year old white male who has  noticed some discomfort and a small lump in his right groin for about three  months.  This has been bothering him more when he exercises.  On exam, he  has a small right inguinal hernia.  He has no evidence of hernia elsewhere.  He was brought to the operating room electively.   DESCRIPTION OF PROCEDURE:  The patient was placed supine on the operating  table.  He was monitored and sedated by the anesthesia department.  The  right groin was prepped and draped in sterile fashion. 1% Xylocaine with  epinephrine was used as a local infiltration anesthetic and a right  ilioinguinal nerve block.  A transverse incision was made overlying the  inguinal canal.  Dissection was carried down through the subcutaneous tissue  exposing the aponeurosis of the external oblique.  The external oblique was  incised in the direction of its fibers, opening up the external inguinal  ring.  The external oblique was dissected away from the underlying tissues  and self-retaining retractors were placed.  The cord structures were  mobilized and encircled with a Penrose drain.  The patient had a small to  medium sized direct inguinal hernia.  This was dissected away from the cord  structures.  The  tissues were imbricated with a running suture of 2-0 Vicryl  to hold the hernia reduced during the repair.  The cord structures were  inspected. Some of the cremasteric muscle fibers were removed.  I made a  search for an indirect sac and there was no indirect sac.  The  iliohypogastric nerve was very low lying across the field of dissection and  I chose  to dissect that back to as far lateral as possible, clamp it,  divided and ligated with a 2-0 silk tie.  The floor of the inguinal canal  was repaired and reinforced with an onlay graft of polypropylene mesh.  A 3  inch x 6 inch piece of mesh was brought to the operative field and trimmed  at the corners to fit the wound.  The mesh was sutured so as to generously  overlap the fascia at the pubic tubercle.  The mesh was sutured along the  inguinal ligament inferiorly, then along the conjoined tendon and internal  oblique superiorly.  The mesh was incised laterally, so as to wrap around  the cord structures at the internal ring.  The tails of the mesh were  overlapped laterally and suture lines completed. This provided a very secure  repair both medial and lateral to the internal ring but allowed an adequate  opening for the cord structures.  The wound was irrigated with saline.  Hemostasis was excellent.  The external oblique was closed with running  suture of 2-0 Vicryl, placing the cord structures deep to the external  oblique.  Scarpa's fascia was  closed with 3-0 Vicryl sutures and the skin closed with running subcuticular  sutures of 4-0 Vicryl and Steri-Strips.  Clean bandages were placed and the  patient taken to recovery room in stable condition.  The estimated blood  loss was about 10 mL.  Complications were none.  Sponge, needle and  instrument counts were correct.                                               Edsel Petrin. Dalbert Batman, M.D.    HMI/MEDQ  D:  05/14/2004  T:  05/15/2004  Job:  [id number]   cc:   [name]. Plotnikov, M.D.  [facility]

## 2011-05-12 ENCOUNTER — Other Ambulatory Visit: Payer: Self-pay | Admitting: Internal Medicine

## 2011-05-12 ENCOUNTER — Other Ambulatory Visit (INDEPENDENT_AMBULATORY_CARE_PROVIDER_SITE_OTHER): Payer: Medicare HMO

## 2011-05-12 DIAGNOSIS — I10 Essential (primary) hypertension: Secondary | ICD-10-CM

## 2011-05-12 DIAGNOSIS — E785 Hyperlipidemia, unspecified: Secondary | ICD-10-CM

## 2011-05-12 LAB — BASIC METABOLIC PANEL
CO2: 30 mEq/L (ref 19–32)
Glucose, Bld: 81 mg/dL (ref 70–99)
Potassium: 5.1 mEq/L (ref 3.5–5.1)
Sodium: 140 mEq/L (ref 135–145)

## 2011-05-12 LAB — LIPID PANEL: Triglycerides: 31 mg/dL (ref 0.0–149.0)

## 2011-05-12 LAB — LDL CHOLESTEROL, DIRECT: Direct LDL: 107.1 mg/dL

## 2011-05-20 ENCOUNTER — Encounter: Payer: Self-pay | Admitting: Internal Medicine

## 2011-05-21 ENCOUNTER — Ambulatory Visit (INDEPENDENT_AMBULATORY_CARE_PROVIDER_SITE_OTHER): Payer: Medicare HMO | Admitting: Internal Medicine

## 2011-05-21 ENCOUNTER — Encounter: Payer: Self-pay | Admitting: Internal Medicine

## 2011-05-21 DIAGNOSIS — I10 Essential (primary) hypertension: Secondary | ICD-10-CM

## 2011-05-21 DIAGNOSIS — R42 Dizziness and giddiness: Secondary | ICD-10-CM

## 2011-05-21 DIAGNOSIS — N509 Disorder of male genital organs, unspecified: Secondary | ICD-10-CM

## 2011-05-21 DIAGNOSIS — E785 Hyperlipidemia, unspecified: Secondary | ICD-10-CM

## 2011-05-21 MED ORDER — LISINOPRIL 20 MG PO TABS: 20.0000 mg | ORAL_TABLET | Freq: Every day | ORAL | Status: DC

## 2011-05-21 NOTE — Assessment & Plan Note (Signed)
Resolved

## 2011-05-21 NOTE — Progress Notes (Signed)
  Subjective:    Patient ID: Sergio Hughes, male    DOB: [birth year], 70 y.o.   MRN: [id number]  HPI  The patient presents for a follow-up of  chronic hypertension, chronic dyslipidemia, OA controlled with medicines Dizzy spells were corrected w/snacking. He stopped Lovastatin due to side effects  Wt Readings from Last 3 Encounters:  05/21/11 141 lb (63.957 kg)  02/19/11 141 lb (63.957 kg)  01/14/11 147 lb 8 oz (66.906 kg)     Review of Systems  Constitutional: Negative for appetite change, fatigue and unexpected weight change.  HENT: Negative for nosebleeds, congestion, sore throat, sneezing, trouble swallowing and neck pain.   Eyes: Negative for itching and visual disturbance.  Respiratory: Negative for cough.   Cardiovascular: Negative for chest pain, palpitations and leg swelling.  Gastrointestinal: Negative for nausea, diarrhea, blood in stool and abdominal distention.  Genitourinary: Negative for frequency and hematuria.  Musculoskeletal: Negative for back pain, joint swelling and gait problem.  Skin: Negative for rash.  Neurological: Negative for dizziness, tremors, speech difficulty and weakness.  Psychiatric/Behavioral: Negative for sleep disturbance, dysphoric mood and agitation. The patient is not nervous/anxious.        Objective:   Physical Exam  Constitutional: He is oriented to person, place, and time. He appears well-developed.  HENT:  Mouth/Throat: Oropharynx is clear and moist.  Eyes: Conjunctivae are normal. Pupils are equal, round, and reactive to light.  Neck: Normal range of motion. No JVD present. No thyromegaly present.  Cardiovascular: Normal rate, regular rhythm, normal heart sounds and intact distal pulses.  Exam reveals no gallop and no friction rub.   No murmur heard. Pulmonary/Chest: Effort normal and breath sounds normal. No respiratory distress. He has no wheezes. He has no rales. He exhibits no tenderness.  Abdominal: Soft. Bowel sounds are  normal. He exhibits no distension and no mass. There is no tenderness. There is no rebound and no guarding.  Musculoskeletal: Normal range of motion. He exhibits no edema and no tenderness.  Lymphadenopathy:    He has no cervical adenopathy.  Neurological: He is alert and oriented to person, place, and time. He has normal reflexes. No cranial nerve deficit. He exhibits normal muscle tone. Coordination normal.  Skin: Skin is warm and dry. No rash noted.  Psychiatric: He has a normal mood and affect. His behavior is normal. Judgment and thought content normal.  Hands w/OA      Lab Results  Component Value Date   WBC 7.5 08/21/2010   HGB 14.1 08/21/2010   HCT 41.0 08/21/2010   PLT 184.0 08/21/2010   CHOL 215* 05/12/2011   TRIG 31.0 05/12/2011   HDL 87.60 05/12/2011   LDLDIRECT 107.1 05/12/2011   ALT 25 08/21/2010   AST 29 08/21/2010   NA 140 05/12/2011   K 5.1 05/12/2011   CL 106 05/12/2011   CREATININE 1.2 05/12/2011   BUN 22 05/12/2011   CO2 30 05/12/2011   TSH 0.52 08/21/2010   PSA 2.48 08/21/2010     Assessment & Plan:

## 2011-05-21 NOTE — Assessment & Plan Note (Signed)
OK to stay off Statin

## 2011-05-21 NOTE — Assessment & Plan Note (Signed)
On Rx 

## 2011-11-05 ENCOUNTER — Ambulatory Visit (INDEPENDENT_AMBULATORY_CARE_PROVIDER_SITE_OTHER): Payer: Medicare HMO | Admitting: Internal Medicine

## 2011-11-05 ENCOUNTER — Encounter: Payer: Self-pay | Admitting: Internal Medicine

## 2011-11-05 VITALS — BP 120/88 | HR 72 | Temp 97.6°F | Resp 16 | Wt 141.0 lb

## 2011-11-05 DIAGNOSIS — D485 Neoplasm of uncertain behavior of skin: Secondary | ICD-10-CM

## 2011-11-05 DIAGNOSIS — R0609 Other forms of dyspnea: Secondary | ICD-10-CM

## 2011-11-05 DIAGNOSIS — R0683 Snoring: Secondary | ICD-10-CM | POA: Insufficient documentation

## 2011-11-05 DIAGNOSIS — M1990 Unspecified osteoarthritis, unspecified site: Secondary | ICD-10-CM

## 2011-11-05 DIAGNOSIS — I10 Essential (primary) hypertension: Secondary | ICD-10-CM

## 2011-11-05 MED ORDER — LISINOPRIL 20 MG PO TABS: 20.0000 mg | ORAL_TABLET | Freq: Every day | ORAL | Status: DC

## 2011-11-05 MED ORDER — IBUPROFEN 600 MG PO TABS: ORAL_TABLET | ORAL | Status: AC

## 2011-11-05 NOTE — Assessment & Plan Note (Signed)
Skin bx 

## 2011-11-05 NOTE — Assessment & Plan Note (Signed)
R/o OSA - will schedule a sleep test at [facility]

## 2011-11-05 NOTE — Assessment & Plan Note (Signed)
Ibuprofen Rx prn 

## 2011-11-05 NOTE — Progress Notes (Signed)
  Subjective:    Patient ID: Sergio Hughes, male    DOB: February 09, 1941, 70 y.o.   MRN: [id number]  HPI  C/o OSA and moles F/u HTN, OA - hands  Review of Systems  Constitutional: Negative for appetite change, fatigue and unexpected weight change.  HENT: Negative for nosebleeds, congestion, sore throat, sneezing, trouble swallowing and neck pain.        Snoring   Eyes: Negative for itching and visual disturbance.  Respiratory: Negative for cough.   Cardiovascular: Negative for chest pain, palpitations and leg swelling.  Gastrointestinal: Negative for nausea, diarrhea, blood in stool and abdominal distention.  Genitourinary: Negative for frequency and hematuria.  Musculoskeletal: Positive for arthralgias. Negative for back pain, joint swelling and gait problem.  Skin: Negative for rash.  Neurological: Negative for dizziness, tremors, speech difficulty and weakness.  Psychiatric/Behavioral: Negative for sleep disturbance, dysphoric mood and agitation. The patient is not nervous/anxious.        Objective:   Physical Exam  Constitutional: He is oriented to person, place, and time. He appears well-developed.  HENT:  Mouth/Throat: Oropharynx is clear and moist.  Eyes: Conjunctivae are normal. Pupils are equal, round, and reactive to light.  Neck: Normal range of motion. No JVD present. No thyromegaly present.  Cardiovascular: Normal rate, regular rhythm, normal heart sounds and intact distal pulses.  Exam reveals no gallop and no friction rub.   No murmur heard. Pulmonary/Chest: Effort normal and breath sounds normal. No respiratory distress. He has no wheezes. He has no rales. He exhibits no tenderness.  Abdominal: Soft. Bowel sounds are normal. He exhibits no distension and no mass. There is no tenderness. There is no rebound and no guarding.  Musculoskeletal: Normal range of motion. He exhibits no edema and no tenderness.  Lymphadenopathy:    He has no cervical adenopathy.  Neurological:  He is alert and oriented to person, place, and time. He has normal reflexes. No cranial nerve deficit. He exhibits normal muscle tone. Coordination normal.  Skin: Skin is warm and dry. No rash noted.       Moles on back, chest  Psychiatric: He has a normal mood and affect. His behavior is normal. Judgment and thought content normal.          Assessment & Plan:

## 2011-11-05 NOTE — Assessment & Plan Note (Signed)
Continue with current prescription therapy as reflected on the Med list.  

## 2011-11-30 ENCOUNTER — Ambulatory Visit: Payer: Medicare HMO | Attending: Internal Medicine | Admitting: Sleep Medicine

## 2011-11-30 DIAGNOSIS — G4730 Sleep apnea, unspecified: Secondary | ICD-10-CM

## 2011-11-30 DIAGNOSIS — G4733 Obstructive sleep apnea (adult) (pediatric): Secondary | ICD-10-CM | POA: Insufficient documentation

## 2011-12-04 ENCOUNTER — Encounter: Payer: Self-pay | Admitting: Gastroenterology

## 2011-12-07 NOTE — Procedures (Signed)
NAME:  Sergio Hughes, Sergio Hughes                  ACCOUNT NO.:  [id number]  MEDICAL RECORD NO.:  [id number]          PATIENT TYPE:  OUT  LOCATION:  SLEEP LAB                     FACILITY:  [facility]  PHYSICIAN:  Leanza Shepperson A. Merlene Laughter, M.D. DATE OF BIRTH:  1941/06/25  DATE OF STUDY:  11/30/2011                           NOCTURNAL POLYSOMNOGRAM  REFERRING PHYSICIAN:  ALEX PLOTNIKOV  INDICATIONS:  A 71 year old man, who presents with hypersomnia, fatigue, and snoring.  The study is being done to evaluate for obstructive sleep apnea syndrome.  MEDICATIONS: 1. Lisinopril. 2. Motrin. 3. Fish oil. 4. Vitamin D.  EPWORTH SLEEPINESS SCALE: 1. BMI 23.  ARCHITECTURAL SUMMARY:  The total recording time is 418 minutes.  Sleep efficiency is 64%.  Sleep latency is 12.5 minutes.  REM latency 344 minutes.  Stage N1 is 14%, N2 54%, N3 22%, and REM sleep 10%.  RESPIRATORY SUMMARY:  Baseline oxygen saturation is 98, lowest saturation 91.  Diagnostic AHI 4.5 and RDI 8.  LIMB MOVEMENT SUMMARY:  PLM index 0.  ELECTROCARDIOGRAM SUMMARY:  Average heart rate is 71 with sinus arrhythmias observed from time-to-time.  IMPRESSION:  Mild obstructive sleep apnea syndrome, not requiring positive pressure.  Thanks for this referral.   Jadine Brumley A. Merlene Laughter, M.D.    KAD/MEDQ  D:  12/06/2011 10:20:40  T:  12/07/2011 05:49:16  Job:  [id number]

## 2011-12-15 ENCOUNTER — Ambulatory Visit (AMBULATORY_SURGERY_CENTER): Payer: Medicare HMO

## 2011-12-15 ENCOUNTER — Encounter: Payer: Self-pay | Admitting: Gastroenterology

## 2011-12-15 VITALS — Ht 65.0 in | Wt 144.7 lb

## 2011-12-15 DIAGNOSIS — Z860100 Personal history of colon polyps, unspecified: Secondary | ICD-10-CM

## 2011-12-15 DIAGNOSIS — Z1211 Encounter for screening for malignant neoplasm of colon: Secondary | ICD-10-CM

## 2011-12-15 MED ORDER — PEG-KCL-NACL-NASULF-NA ASC-C 100 G PO SOLR: 1.0000 | Freq: Once | ORAL | Status: AC

## 2011-12-29 ENCOUNTER — Encounter: Payer: Self-pay | Admitting: Gastroenterology

## 2011-12-29 ENCOUNTER — Ambulatory Visit (AMBULATORY_SURGERY_CENTER): Payer: Medicare HMO | Admitting: Gastroenterology

## 2011-12-29 VITALS — BP 143/87 | HR 85 | Temp 95.4°F | Resp 22

## 2011-12-29 DIAGNOSIS — K5730 Diverticulosis of large intestine without perforation or abscess without bleeding: Secondary | ICD-10-CM

## 2011-12-29 DIAGNOSIS — Z1211 Encounter for screening for malignant neoplasm of colon: Secondary | ICD-10-CM

## 2011-12-29 DIAGNOSIS — Z86010 Personal history of colonic polyps: Secondary | ICD-10-CM

## 2011-12-29 DIAGNOSIS — D126 Benign neoplasm of colon, unspecified: Secondary | ICD-10-CM

## 2011-12-29 MED ORDER — SODIUM CHLORIDE 0.9 % IV SOLN: 500.0000 mL | INTRAVENOUS | Status: DC

## 2011-12-29 NOTE — Progress Notes (Signed)
Patient did not have preoperative order for IV antibiotic SSI prophylaxis. (G8918)  Patient did not experience any of the following events: a burn prior to discharge; a fall within the facility; wrong site/side/patient/procedure/implant event; or a hospital transfer or hospital admission upon discharge from the facility. (G8907)  

## 2011-12-29 NOTE — Patient Instructions (Signed)
Read the handouts given to you by your recovery room nurse.  Your polyp results will be mailed to your home within two weeks.  Increase the fiber in your diet due to your diverticulosis.   You may resume your routine medications today.  If you have any questions, please call us at [phone].  Thank-you.

## 2011-12-29 NOTE — Op Note (Signed)
[facility] [address] [place], [place]  [address]  COLONOSCOPY PROCEDURE REPORT  PATIENT:  Sergio Hughes, Sergio Hughes  MR#:  [id number] BIRTHDATE:  December 19, 1940, 76 yrs. old  GENDER:  male ENDOSCOPIST:  Loralee Pacas. Sharlett Iles, MD, [facility] REF. BY: PROCEDURE DATE:  12/29/2011 PROCEDURE:  Colonoscopy with snare polypectomy ASA CLASS:  Class III INDICATIONS:  history of pre-cancerous (adenomatous) colon polyps  MEDICATIONS:   propofol (Diprivan) 250 mg IV  DESCRIPTION OF PROCEDURE:   After the risks and benefits and of the procedure were explained, informed consent was obtained. Digital rectal exam was performed and revealed no abnormalities. The [id number] endoscope was introduced through the anus and advanced to the cecum, which was identified by both the appendix and ileocecal valve.  The quality of the prep was excellent, using MoviPrep.  The instrument was then slowly withdrawn as the colon was fully examined. <<PROCEDUREIMAGES>>  FINDINGS:  Scattered diverticula were found.  A sessile polyp was found in the cecum. FLAT SPREADING CECAL POLYP PIECEMEAL REMOVED.SEE PICTURES.   Retroflexed views in the rectum revealed no abnormalities.    The scope was then withdrawn from the patient and the procedure completed.  COMPLICATIONS:  None ENDOSCOPIC IMPRESSION: 1) Diverticula, scattered 2) Sessile polyp in the cecum RTECURRENT CECAL VILLOUS ADENOMA,R/O ATYPIA. RECOMMENDATIONS: 1) Await pathology results REPEAT 3 YEARS PER PATH REVIEW.  REPEAT EXAM:  No  ______________________________ Loralee Pacas. Sharlett Iles, MD, [facility]  CC:  Altamese Warren. Plotnikov, MD  n. Lorrin MaisLoralee Pacas. Ules Marsala at 12/29/2011 11:27 AM  Drema Pry, [id number]

## 2011-12-30 ENCOUNTER — Telehealth: Payer: Self-pay

## 2011-12-30 NOTE — Telephone Encounter (Signed)
  Follow up Call-  Call back number 12/29/2011  Post procedure Call Back phone  # [phone]  Permission to leave phone message Yes     Patient questions:  Do you have a fever, pain , or abdominal swelling? no Pain Score  0 *  Have you tolerated food without any problems? yes  Have you been able to return to your normal activities? yes  Do you have any questions about your discharge instructions: Diet   no Medications  no Follow up visit  no  Do you have questions or concerns about your Care? no  Actions: * If pain score is 4 or above: No action needed, pain <4.

## 2012-01-04 ENCOUNTER — Encounter: Payer: Self-pay | Admitting: Gastroenterology

## 2012-03-08 ENCOUNTER — Ambulatory Visit: Payer: Medicare HMO | Admitting: Internal Medicine

## 2012-03-08 DIAGNOSIS — Z0289 Encounter for other administrative examinations: Secondary | ICD-10-CM

## 2012-03-14 ENCOUNTER — Ambulatory Visit (INDEPENDENT_AMBULATORY_CARE_PROVIDER_SITE_OTHER): Payer: Medicare HMO | Admitting: Orthopedic Surgery

## 2012-03-14 ENCOUNTER — Encounter: Payer: Self-pay | Admitting: Orthopedic Surgery

## 2012-03-14 ENCOUNTER — Ambulatory Visit (INDEPENDENT_AMBULATORY_CARE_PROVIDER_SITE_OTHER): Payer: Medicare HMO

## 2012-03-14 VITALS — BP 108/60 | Ht 65.0 in | Wt 142.0 lb

## 2012-03-14 DIAGNOSIS — M25569 Pain in unspecified knee: Secondary | ICD-10-CM

## 2012-03-14 DIAGNOSIS — M25561 Pain in right knee: Secondary | ICD-10-CM

## 2012-03-14 NOTE — Patient Instructions (Signed)
You have received a steroid shot. 15% of patients experience increased pain at the injection site with in the next 24 hours. This is best treated with ice and tylenol extra strength 2 tabs every 8 hours. If you are still having pain please call the office.    

## 2012-03-14 NOTE — Progress Notes (Signed)
  Subjective:    Sergio Hughes is a 71 y.o. male who presents with the sudden onset of intense pain in the RIGHT knee, especially when driving or sitting. Approximately 2 months ago. The patient was putting down some flooring in some other heavy work and noticed acute onset of pain, which is described as sharp, 8/10, and intermittent. The pain is better when he straightens his knee gets worse when he keeps it in a flexed position. The primary area of pain is in the anterior knee just below the patella at the patellar patellar tendon junction  He has a completely negative review of systems.   The following portions of the patient's history were reviewed and updated as appropriate: allergies, current medications, past family history, past medical history, past social history, past surgical history and problem list.   Review of Systems Pertinent items are noted in HPI.   Objective:    BP 108/60  Ht 5\' 5"  (1.651 m)  Wt 142 lb (64.411 kg)  BMI 23.63 kg/m2  The patient has normal overall appearance. He is oriented x3. His mood and affect are normal. His skin is intact. There are no rashes, or lesions pulse, and temperature of his extremity is normal. There is no lymph nodes lymphadenopathy. Sensation is intact. Pathologic reflexes are negative and coordination and balance are normal.   Right knee: normal and no effusion, full active range of motion, no joint line tenderness, ligamentous structures intact. just at the midpole of the patella on the medial side. There was a palpable plica and tenderness  Left knee:  normal and no effusion, full active range of motion, no joint line tenderness, ligamentous structures intact.   X-ray right knee: Mild age-appropriate degenerative changes. No fracture or bony abnormality is seen  Assessment:    Right perhaps of plica Plica irritation, patellofemoral joint   Plan:    injection followup in 2 weeks   Knee  Injection Procedure Note  Pre-operative  Diagnosis: right knee oa  Post-operative Diagnosis: same  Indications: pain  Anesthesia: ethyl chloride   Procedure Details   Verbal consent was obtained for the procedure. Time out was completed.The joint was prepped with alcohol, followed by  Ethyl chloride spray and A 20 gauge needle was inserted into the knee via lateral approach; 96ml 1% lidocaine and 1 ml of depomedrol  was then injected into the joint . The needle was removed and the area cleansed and dressed.  Complications:  None; patient tolerated the procedure well.

## 2012-03-29 ENCOUNTER — Encounter: Payer: Self-pay | Admitting: Orthopedic Surgery

## 2012-03-29 ENCOUNTER — Ambulatory Visit (INDEPENDENT_AMBULATORY_CARE_PROVIDER_SITE_OTHER): Payer: Medicare HMO | Admitting: Orthopedic Surgery

## 2012-03-29 VITALS — BP 116/60 | Ht 65.0 in | Wt 142.0 lb

## 2012-03-29 DIAGNOSIS — M1990 Unspecified osteoarthritis, unspecified site: Secondary | ICD-10-CM

## 2012-03-29 NOTE — Progress Notes (Signed)
Subjective:     Patient ID: Sergio Hughes, male   DOB: 08-25-1941, 71 y.o.   MRN: [id number] Chief Complaint  Patient presents with  . Follow-up    recheck right knee / s/p injection     HPI Pain in the RIGHT knee over the medial aspect. X-rays were really not very impressive in terms of arthritis just may be some age-appropriate joint space narrowing, which is mild.  Injection helped significantly. No catching, locking, or giving way   Review of Systems     Objective:   Physical Exam     Assessment:     Aspercreme recommended for soreness over the medial knee    Plan:     Followup or call for appointment if needed. If symptoms worsen again.

## 2012-03-29 NOTE — Patient Instructions (Signed)
Activity as tolerated

## 2012-05-05 ENCOUNTER — Ambulatory Visit (INDEPENDENT_AMBULATORY_CARE_PROVIDER_SITE_OTHER): Payer: Medicare HMO | Admitting: Internal Medicine

## 2012-05-05 ENCOUNTER — Encounter: Payer: Self-pay | Admitting: Internal Medicine

## 2012-05-05 ENCOUNTER — Other Ambulatory Visit: Payer: Self-pay | Admitting: Internal Medicine

## 2012-05-05 VITALS — BP 120/68 | HR 76 | Temp 97.6°F | Resp 16 | Wt 140.0 lb

## 2012-05-05 DIAGNOSIS — D485 Neoplasm of uncertain behavior of skin: Secondary | ICD-10-CM

## 2012-05-05 DIAGNOSIS — D489 Neoplasm of uncertain behavior, unspecified: Secondary | ICD-10-CM

## 2012-05-05 NOTE — Progress Notes (Signed)
Subjective:    Patient ID: Sergio Hughes, male    DOB: 10-20-1941, 71 y.o.   MRN: [id number]  HPI  Skin bx  Review of Systems     Objective:   Physical Exam   Procedure Note :     Procedure :  Skin biopsy   Indication:  Changing mole (s ),  Suspicious lesion(s), irritated SKs   Risks including unsuccessful procedure , bleeding, infection, bruising, scar, a need for another complete procedure and others were explained to the patient in detail as well as the benefits. Informed consent was obtained and signed.   The patient was placed in a decubitus position.  Lesion #1 on   L ant shoulder  measuring   6x3  mm   Skin over lesion #1  was prepped with Betadine and alcohol  and anesthetized with 1 cc of 2% lidocaine and epinephrine, using a 25-gauge 1 inch needle.  Shave biopsy with a sterile Dermablade was carried out in the usual fashion. Hyfrecator was used to destroy the rest of the lesion potentially left behind and for hemostasis. Band-Aid was applied with antibiotic ointment.    Lesion #2 on R anter prox neck   measuring 9x5  mm   Skin over lesion #2  was prepped with Betadine and alcohol  and anesthetized with 1 cc of 2% lidocaine and epinephrine, using a 25-gauge 1 inch needle.  Shave biopsy with a sterile Dermablade was carried out in the usual fashion. Hyfrecator was used to destroy the rest of the lesion potentially left behind and for hemostasis. Band-Aid was applied with antibiotic ointment.  Lesion #3 on R scapula   measuring   6x6 mm   Skin over lesion #3  was prepped with Betadine and alcohol  and anesthetized with 1 cc of 2% lidocaine and epinephrine, using a 25-gauge 1 inch needle.  Shave biopsy with a sterile Dermablade was carried out in the usual fashion. Hyfrecator was used to destroy the rest of the lesion potentially left behind and for hemostasis. Band-Aid was applied with antibiotic ointment.  Lesion #4 on  On L scapula  measuring  5x4 mm   Skin over lesion  #3  was prepped with Betadine and alcohol  and anesthetized with 1 cc of 2% lidocaine and epinephrine, using a 25-gauge 1 inch needle.  Shave biopsy with a sterile Dermablade was carried out in the usual fashion. Hyfrecator was used to destroy the rest of the lesion potentially left behind and for hemostasis. Band-Aid was applied with antibiotic ointment.  Lesion #5 on  L lower thor spine area  measuring 8x5  mm   Skin over lesion #3  was prepped with Betadine and alcohol  and anesthetized with 1 cc of 2% lidocaine and epinephrine, using a 25-gauge 1 inch needle.  Shave biopsy with a sterile Dermablade was carried out in the usual fashion. Hyfrecator was used to destroy the rest of the lesion potentially left behind and for hemostasis. Band-Aid was applied with antibiotic ointment.   Tolerated well. Complications none.  #3,4 were sent to [facility] #1,2,5 were discarded (irritated SK's)    Postprocedure instructions :    A Band-Aid should be  changed twice daily. You can take a shower tomorrow.  Keep the wounds clean. You can wash them with liquid soap and water. Pat dry with gauze or a Kleenex tissue  Before applying antibiotic ointment and a Band-Aid.   You need to report immediately  if fever, chills or  any signs of infection develop.    The biopsy results should be available in 1 -2 weeks.        Assessment & Plan:

## 2012-05-05 NOTE — Patient Instructions (Addendum)
Postprocedure instructions :    A Band-Aid should be  changed twice daily. You can take a shower tomorrow.  Keep the wounds clean. You can wash them with liquid soap and water. Pat dry with gauze or a Kleenex tissue  Before applying antibiotic ointment and a Band-Aid.   You need to report immediately  if fever, chills or any signs of infection develop.    The biopsy results should be available in 1 -2 weeks. 

## 2012-05-05 NOTE — Assessment & Plan Note (Signed)
See procedure 

## 2012-05-11 ENCOUNTER — Telehealth: Payer: Self-pay | Admitting: Internal Medicine

## 2012-05-11 NOTE — Telephone Encounter (Signed)
Erline Levine, please, inform patient that all Bx are normal Thx

## 2012-05-11 NOTE — Telephone Encounter (Signed)
Pt informed

## 2012-06-06 ENCOUNTER — Other Ambulatory Visit: Payer: Self-pay | Admitting: *Deleted

## 2012-06-06 MED ORDER — IBUPROFEN 600 MG PO TABS: 600.0000 mg | ORAL_TABLET | Freq: Four times a day (QID) | ORAL | Status: DC | PRN

## 2012-11-11 ENCOUNTER — Other Ambulatory Visit (INDEPENDENT_AMBULATORY_CARE_PROVIDER_SITE_OTHER): Payer: Medicare HMO

## 2012-11-11 ENCOUNTER — Encounter: Payer: Self-pay | Admitting: Internal Medicine

## 2012-11-11 ENCOUNTER — Ambulatory Visit (INDEPENDENT_AMBULATORY_CARE_PROVIDER_SITE_OTHER): Payer: Medicare HMO | Admitting: Internal Medicine

## 2012-11-11 VITALS — BP 140/78 | HR 80 | Temp 97.9°F | Resp 16 | Ht 65.0 in | Wt 144.0 lb

## 2012-11-11 DIAGNOSIS — E785 Hyperlipidemia, unspecified: Secondary | ICD-10-CM

## 2012-11-11 DIAGNOSIS — Z23 Encounter for immunization: Secondary | ICD-10-CM

## 2012-11-11 DIAGNOSIS — N529 Male erectile dysfunction, unspecified: Secondary | ICD-10-CM

## 2012-11-11 DIAGNOSIS — N320 Bladder-neck obstruction: Secondary | ICD-10-CM

## 2012-11-11 DIAGNOSIS — I10 Essential (primary) hypertension: Secondary | ICD-10-CM

## 2012-11-11 DIAGNOSIS — Z136 Encounter for screening for cardiovascular disorders: Secondary | ICD-10-CM

## 2012-11-11 DIAGNOSIS — Z0000 Encounter for general adult medical examination without abnormal findings: Secondary | ICD-10-CM | POA: Insufficient documentation

## 2012-11-11 DIAGNOSIS — M1990 Unspecified osteoarthritis, unspecified site: Secondary | ICD-10-CM

## 2012-11-11 LAB — BASIC METABOLIC PANEL
GFR: 65.24 mL/min (ref >60.00)
Potassium: 6 mEq/L — ABNORMAL HIGH (ref 3.5–5.1)
Sodium: 139 mEq/L (ref 135–145)

## 2012-11-11 LAB — CBC WITH DIFFERENTIAL/PLATELET
Eosinophils Relative: 2.9 % (ref 0.0–5.0)
HCT: 42.1 % (ref 39.0–52.0)
Hemoglobin: 14.3 g/dL (ref 13.0–17.0)
Lymphs Abs: 2.1 10*3/uL (ref 0.7–4.0)
Monocytes Relative: 7.4 % (ref 3.0–12.0)
Neutro Abs: 5.7 10*3/uL (ref 1.4–7.7)
Platelets: 212 10*3/uL (ref 150.0–400.0)
RBC: 4.62 Mil/uL (ref 4.22–5.81)
WBC: 8.8 10*3/uL (ref 4.5–10.5)

## 2012-11-11 LAB — URINALYSIS
Bilirubin Urine: NEGATIVE
Hgb urine dipstick: NEGATIVE
Ketones, ur: NEGATIVE
Leukocytes, UA: NEGATIVE
Nitrite: NEGATIVE

## 2012-11-11 LAB — LIPID PANEL
Cholesterol: 218 mg/dL — ABNORMAL HIGH (ref 0–200)
Total CHOL/HDL Ratio: 3
VLDL: 8 mg/dL (ref 0.0–40.0)

## 2012-11-11 LAB — HEPATIC FUNCTION PANEL
Albumin: 3.6 g/dL (ref 3.5–5.2)
Alkaline Phosphatase: 76 U/L (ref 39–117)

## 2012-11-11 NOTE — Assessment & Plan Note (Signed)
Here for medicare wellness/physical  Diet: heart healthy  Physical activity: active Depression/mood screen: negative  Hearing: intact to whispered voice  Visual acuity: grossly normal, performs annual eye exam  ADLs: capable  Fall risk: none  Home safety: good  Cognitive evaluation: intact to orientation, naming, recall and repetition  EOL planning: adv directives, full code/ I agree  I have personally reviewed and have noted  1. The patient's medical and social history  2. Their use of alcohol, tobacco or illicit drugs  3. Their current medications and supplements  4. The patient's functional ability including ADL's, fall risks, home safety risks and hearing or visual impairment.  5. Diet and physical activities  6. Evidence for depression or mood disorders    Today patient counseled on age appropriate routine health concerns for screening and prevention, each reviewed and up to date or declined. Immunizations reviewed and up to date or declined. Labs ordered and reviewed. Risk factors for depression reviewed and negative. Hearing function and visual acuity are intact. ADLs screened and addressed as needed. Functional ability and level of safety reviewed and appropriate. Education, counseling and referrals performed based on assessed risks today. Patient provided with a copy of personalized plan for preventive services.

## 2012-11-11 NOTE — Assessment & Plan Note (Signed)
  On diet  

## 2012-11-11 NOTE — Assessment & Plan Note (Signed)
On glucosamine

## 2012-11-11 NOTE — Assessment & Plan Note (Signed)
Continue with current prescription therapy as reflected on the Med list.  

## 2012-11-11 NOTE — Progress Notes (Signed)
   Subjective:    Patient ID: Sergio Hughes, male    DOB: 04/17/1941, 71 y.o.   MRN: [id number]  HPI The patient is here for a wellness exam. The patient has been doing well overall without major physical or psychological issues going on lately. The patient presents for a follow-up of  chronic hypertension, chronic dyslipidemia, OA controlled with medicines Dizzy spells were corrected w/snacking. He stopped Lovastatin due to side effects  Wt Readings from Last 3 Encounters:  11/11/12 144 lb (65.318 kg)  05/05/12 140 lb (63.504 kg)  03/29/12 142 lb (64.411 kg)   BP Readings from Last 3 Encounters:  11/11/12 140/78  05/05/12 120/68  03/29/12 116/60      Review of Systems  Constitutional: Negative for appetite change, fatigue and unexpected weight change.  HENT: Negative for nosebleeds, congestion, sore throat, sneezing, trouble swallowing and neck pain.   Eyes: Negative for itching and visual disturbance.  Respiratory: Negative for cough.   Cardiovascular: Negative for chest pain, palpitations and leg swelling.  Gastrointestinal: Negative for nausea, diarrhea, blood in stool and abdominal distention.  Genitourinary: Negative for frequency and hematuria.  Musculoskeletal: Negative for back pain, joint swelling and gait problem.  Skin: Negative for rash.  Neurological: Negative for dizziness, tremors, speech difficulty and weakness.  Psychiatric/Behavioral: Negative for sleep disturbance, dysphoric mood and agitation. The patient is not nervous/anxious.        Objective:   Physical Exam  Constitutional: He is oriented to person, place, and time. He appears well-developed.  HENT:  Mouth/Throat: Oropharynx is clear and moist.  Eyes: Conjunctivae normal are normal. Pupils are equal, round, and reactive to light.  Neck: Normal range of motion. No JVD present. No thyromegaly present.  Cardiovascular: Normal rate, regular rhythm, normal heart sounds and intact distal pulses.  Exam  reveals no gallop and no friction rub.   No murmur heard. Pulmonary/Chest: Effort normal and breath sounds normal. No respiratory distress. He has no wheezes. He has no rales. He exhibits no tenderness.  Abdominal: Soft. Bowel sounds are normal. He exhibits no distension and no mass. There is no tenderness. There is no rebound and no guarding.  Musculoskeletal: Normal range of motion. He exhibits no edema and no tenderness.  Lymphadenopathy:    He has no cervical adenopathy.  Neurological: He is alert and oriented to person, place, and time. He has normal reflexes. No cranial nerve deficit. He exhibits normal muscle tone. Coordination normal.  Skin: Skin is warm and dry. No rash noted.  Psychiatric: He has a normal mood and affect. His behavior is normal. Judgment and thought content normal.  Hands w/OA      Lab Results  Component Value Date   WBC 7.5 08/21/2010   HGB 14.1 08/21/2010   HCT 41.0 08/21/2010   PLT 184.0 08/21/2010   CHOL 215* 05/12/2011   TRIG 31.0 05/12/2011   HDL 87.60 05/12/2011   LDLDIRECT 107.1 05/12/2011   ALT 25 08/21/2010   AST 29 08/21/2010   NA 140 05/12/2011   K 5.1 05/12/2011   CL 106 05/12/2011   CREATININE 1.2 05/12/2011   BUN 22 05/12/2011   CO2 30 05/12/2011   TSH 0.52 08/21/2010   PSA 2.48 08/21/2010     Assessment & Plan:

## 2012-11-14 ENCOUNTER — Telehealth: Payer: Self-pay | Admitting: Internal Medicine

## 2012-11-14 DIAGNOSIS — E875 Hyperkalemia: Secondary | ICD-10-CM

## 2012-11-14 NOTE — Telephone Encounter (Signed)
Sergio Hughes, please, inform patient that all labs are normal except for elev K. Please recheck BMET on [place] or Fri. It could be due to his BP med vs lab error. Thx

## 2012-11-14 NOTE — Telephone Encounter (Signed)
Pt informed

## 2012-11-17 ENCOUNTER — Other Ambulatory Visit (INDEPENDENT_AMBULATORY_CARE_PROVIDER_SITE_OTHER): Payer: Medicare HMO

## 2012-11-17 DIAGNOSIS — E875 Hyperkalemia: Secondary | ICD-10-CM

## 2012-11-17 LAB — BASIC METABOLIC PANEL
BUN: 26 mg/dL — ABNORMAL HIGH (ref 6–23)
Chloride: 103 mEq/L (ref 96–112)
Creatinine, Ser: 1.3 mg/dL (ref 0.4–1.5)
GFR: 59.89 mL/min — ABNORMAL LOW (ref >60.00)
Glucose, Bld: 105 mg/dL — ABNORMAL HIGH (ref 70–99)

## 2013-05-12 ENCOUNTER — Encounter: Payer: Self-pay | Admitting: Internal Medicine

## 2013-05-12 ENCOUNTER — Ambulatory Visit (INDEPENDENT_AMBULATORY_CARE_PROVIDER_SITE_OTHER): Payer: Medicare HMO | Admitting: Internal Medicine

## 2013-05-12 ENCOUNTER — Other Ambulatory Visit (INDEPENDENT_AMBULATORY_CARE_PROVIDER_SITE_OTHER): Payer: Medicare HMO

## 2013-05-12 VITALS — BP 136/76 | HR 76 | Temp 97.2°F | Resp 16 | Wt 142.0 lb

## 2013-05-12 DIAGNOSIS — M1990 Unspecified osteoarthritis, unspecified site: Secondary | ICD-10-CM

## 2013-05-12 DIAGNOSIS — I10 Essential (primary) hypertension: Secondary | ICD-10-CM

## 2013-05-12 DIAGNOSIS — E785 Hyperlipidemia, unspecified: Secondary | ICD-10-CM

## 2013-05-12 LAB — BASIC METABOLIC PANEL
Calcium: 9.5 mg/dL (ref 8.4–10.5)
Creatinine, Ser: 1.4 mg/dL (ref 0.4–1.5)
GFR: 53.84 mL/min — ABNORMAL LOW (ref >60.00)
Glucose, Bld: 100 mg/dL — ABNORMAL HIGH (ref 70–99)
Sodium: 139 mEq/L (ref 135–145)

## 2013-05-12 MED ORDER — IBUPROFEN 600 MG PO TABS: 600.0000 mg | ORAL_TABLET | Freq: Two times a day (BID) | ORAL | Status: DC | PRN

## 2013-05-12 MED ORDER — IBUPROFEN 600 MG PO TABS: 600.0000 mg | ORAL_TABLET | Freq: Four times a day (QID) | ORAL | Status: DC | PRN

## 2013-05-12 NOTE — Progress Notes (Signed)
   Subjective:    Patient ID: Sergio Hughes, male    DOB: [birth year], 72 y.o.   MRN: [id number]  HPI  The patient presents for a follow-up of  chronic hypertension, chronic dyslipidemia, OA controlled with medicines Dizzy spells were corrected w/snacking. He stopped Lovastatin due to side effects  Wt Readings from Last 3 Encounters:  05/12/13 142 lb (64.411 kg)  11/11/12 144 lb (65.318 kg)  05/05/12 140 lb (63.504 kg)   BP Readings from Last 3 Encounters:  05/12/13 136/76  11/11/12 140/78  05/05/12 120/68      Review of Systems  Constitutional: Negative for appetite change, fatigue and unexpected weight change.  HENT: Negative for nosebleeds, congestion, sore throat, sneezing, trouble swallowing and neck pain.   Eyes: Negative for itching and visual disturbance.  Respiratory: Negative for cough.   Cardiovascular: Negative for chest pain, palpitations and leg swelling.  Gastrointestinal: Negative for nausea, diarrhea, blood in stool and abdominal distention.  Genitourinary: Negative for frequency and hematuria.  Musculoskeletal: Negative for back pain, joint swelling and gait problem.  Skin: Negative for rash.  Neurological: Negative for dizziness, tremors, speech difficulty and weakness.  Psychiatric/Behavioral: Negative for sleep disturbance, dysphoric mood and agitation. The patient is not nervous/anxious.        Objective:   Physical Exam  Constitutional: He is oriented to person, place, and time. He appears well-developed.  HENT:  Mouth/Throat: Oropharynx is clear and moist.  Eyes: Conjunctivae are normal. Pupils are equal, round, and reactive to light.  Neck: Normal range of motion. No JVD present. No thyromegaly present.  Cardiovascular: Normal rate, regular rhythm, normal heart sounds and intact distal pulses.  Exam reveals no gallop and no friction rub.   No murmur heard. Pulmonary/Chest: Effort normal and breath sounds normal. No respiratory distress. He has no  wheezes. He has no rales. He exhibits no tenderness.  Abdominal: Soft. Bowel sounds are normal. He exhibits no distension and no mass. There is no tenderness. There is no rebound and no guarding.  Musculoskeletal: Normal range of motion. He exhibits no edema and no tenderness.  Lymphadenopathy:    He has no cervical adenopathy.  Neurological: He is alert and oriented to person, place, and time. He has normal reflexes. No cranial nerve deficit. He exhibits normal muscle tone. Coordination normal.  Skin: Skin is warm and dry. No rash noted.  Psychiatric: He has a normal mood and affect. His behavior is normal. Judgment and thought content normal.  Hands w/OA      Lab Results  Component Value Date   WBC 8.8 11/11/2012   HGB 14.3 11/11/2012   HCT 42.1 11/11/2012   PLT 212.0 11/11/2012   CHOL 218* 11/11/2012   TRIG 40.0 11/11/2012   HDL 77.00 11/11/2012   LDLDIRECT 135.5 11/11/2012   ALT 20 11/11/2012   AST 22 11/11/2012   NA 137 11/17/2012   K 4.3 11/17/2012   CL 103 11/17/2012   CREATININE 1.3 11/17/2012   BUN 26* 11/17/2012   CO2 28 11/17/2012   TSH 0.58 11/11/2012   PSA 3.35 11/11/2012     Assessment & Plan:

## 2013-05-12 NOTE — Assessment & Plan Note (Signed)
Continue with current prescription therapy as reflected on the Med list.  

## 2013-05-14 ENCOUNTER — Other Ambulatory Visit: Payer: Self-pay | Admitting: Internal Medicine

## 2013-05-14 DIAGNOSIS — I10 Essential (primary) hypertension: Secondary | ICD-10-CM

## 2013-09-28 ENCOUNTER — Other Ambulatory Visit: Payer: Self-pay

## 2013-11-14 ENCOUNTER — Encounter: Payer: Self-pay | Admitting: Internal Medicine

## 2013-11-14 ENCOUNTER — Other Ambulatory Visit (INDEPENDENT_AMBULATORY_CARE_PROVIDER_SITE_OTHER): Payer: Medicare HMO

## 2013-11-14 ENCOUNTER — Ambulatory Visit (INDEPENDENT_AMBULATORY_CARE_PROVIDER_SITE_OTHER): Payer: Medicare HMO | Admitting: Internal Medicine

## 2013-11-14 VITALS — BP 140/82 | HR 80 | Temp 96.9°F | Resp 16 | Ht 65.0 in | Wt 147.0 lb

## 2013-11-14 DIAGNOSIS — N320 Bladder-neck obstruction: Secondary | ICD-10-CM

## 2013-11-14 DIAGNOSIS — Z0000 Encounter for general adult medical examination without abnormal findings: Secondary | ICD-10-CM

## 2013-11-14 DIAGNOSIS — Z23 Encounter for immunization: Secondary | ICD-10-CM

## 2013-11-14 DIAGNOSIS — I10 Essential (primary) hypertension: Secondary | ICD-10-CM

## 2013-11-14 DIAGNOSIS — E785 Hyperlipidemia, unspecified: Secondary | ICD-10-CM

## 2013-11-14 LAB — LDL CHOLESTEROL, DIRECT: Direct LDL: 144.9 mg/dL

## 2013-11-14 LAB — BASIC METABOLIC PANEL
CO2: 27 mEq/L (ref 19–32)
Chloride: 104 mEq/L (ref 96–112)
Glucose, Bld: 89 mg/dL (ref 70–99)
Potassium: 4.9 mEq/L (ref 3.5–5.1)
Sodium: 138 mEq/L (ref 135–145)

## 2013-11-14 LAB — HEPATIC FUNCTION PANEL
ALT: 26 U/L (ref 0–53)
AST: 31 U/L (ref 0–37)
Albumin: 4.1 g/dL (ref 3.5–5.2)
Alkaline Phosphatase: 69 U/L (ref 39–117)
Total Protein: 7 g/dL (ref 6.0–8.3)

## 2013-11-14 LAB — LIPID PANEL
Cholesterol: 229 mg/dL — ABNORMAL HIGH (ref 0–200)
HDL: 76.5 mg/dL (ref >39.00)

## 2013-11-14 LAB — URINALYSIS
Bilirubin Urine: NEGATIVE
Hgb urine dipstick: NEGATIVE
Ketones, ur: NEGATIVE
Nitrite: NEGATIVE
Specific Gravity, Urine: 1.025 (ref 1.000–1.030)
Total Protein, Urine: NEGATIVE
Urine Glucose: NEGATIVE

## 2013-11-14 LAB — CBC WITH DIFFERENTIAL/PLATELET
Basophils Absolute: 0.1 10*3/uL (ref 0.0–0.1)
Basophils Relative: 1.5 % (ref 0.0–3.0)
Eosinophils Absolute: 0.2 10*3/uL (ref 0.0–0.7)
HCT: 43.4 % (ref 39.0–52.0)
Hemoglobin: 14.5 g/dL (ref 13.0–17.0)
Lymphocytes Relative: 23.4 % (ref 12.0–46.0)
Lymphs Abs: 1.7 10*3/uL (ref 0.7–4.0)
MCHC: 33.5 g/dL (ref 30.0–36.0)
MCV: 91.8 fl (ref 78.0–100.0)
Monocytes Absolute: 0.7 10*3/uL (ref 0.1–1.0)
Neutro Abs: 4.6 10*3/uL (ref 1.4–7.7)
Neutrophils Relative %: 63.3 % (ref 43.0–77.0)
RBC: 4.73 Mil/uL (ref 4.22–5.81)
RDW: 12.8 % (ref 11.5–14.6)

## 2013-11-14 MED ORDER — LISINOPRIL 20 MG PO TABS: 10.0000 mg | ORAL_TABLET | Freq: Every day | ORAL | Status: DC

## 2013-11-14 MED ORDER — IBUPROFEN 600 MG PO TABS: 600.0000 mg | ORAL_TABLET | Freq: Two times a day (BID) | ORAL | Status: DC | PRN

## 2013-11-14 NOTE — Assessment & Plan Note (Signed)
Here for medicare wellness/physical  Diet: heart healthy  Physical activity: active Depression/mood screen: negative  Hearing: intact to whispered voice  Visual acuity: grossly normal, performs annual eye exam  ADLs: capable  Fall risk: none  Home safety: good  Cognitive evaluation: intact to orientation, naming, recall and repetition  EOL planning: adv directives, full code/ I agree  I have personally reviewed and have noted  1. The patient's medical and social history  2. Their use of alcohol, tobacco or illicit drugs  3. Their current medications and supplements  4. The patient's functional ability including ADL's, fall risks, home safety risks and hearing or visual impairment.  5. Diet and physical activities  6. Evidence for depression or mood disorders    Today patient counseled on age appropriate routine health concerns for screening and prevention, each reviewed and up to date or declined. Immunizations reviewed and up to date or declined. Labs ordered and reviewed. Risk factors for depression reviewed and negative. Hearing function and visual acuity are intact. ADLs screened and addressed as needed. Functional ability and level of safety reviewed and appropriate. Education, counseling and referrals performed based on assessed risks today. Patient provided with a copy of personalized plan for preventive services.  Labs [employer]

## 2013-11-14 NOTE — Assessment & Plan Note (Signed)
Labs

## 2013-11-14 NOTE — Addendum Note (Signed)
Addended by: Cresenciano Lick on: 11/14/2013 08:57 AM   Modules accepted: Orders

## 2013-11-14 NOTE — Assessment & Plan Note (Signed)
Continue with current prescription therapy as reflected on the Med list.  

## 2013-11-14 NOTE — Progress Notes (Signed)
Pre visit review using our clinic review tool, if applicable. No additional management support is needed unless otherwise documented below in the visit note. 

## 2013-11-14 NOTE — Progress Notes (Signed)
   Subjective:    HPI  The patient is here for a wellness exam. The patient has been doing well overall without major physical or psychological issues going on lately  The patient presents for a follow-up of  chronic hypertension, chronic dyslipidemia, OA controlled with medicines Dizzy spells were corrected w/snacking. He stopped Lovastatin due to side effects  Wt Readings from Last 3 Encounters:  11/14/13 147 lb (66.679 kg)  05/12/13 142 lb (64.411 kg)  11/11/12 144 lb (65.318 kg)   BP Readings from Last 3 Encounters:  11/14/13 140/82  05/12/13 136/76  11/11/12 140/78      Review of Systems  Constitutional: Negative for appetite change, fatigue and unexpected weight change.  HENT: Negative for congestion, nosebleeds, sneezing, sore throat and trouble swallowing.   Eyes: Negative for itching and visual disturbance.  Respiratory: Negative for cough.   Cardiovascular: Negative for chest pain, palpitations and leg swelling.  Gastrointestinal: Negative for nausea, diarrhea, blood in stool and abdominal distention.  Genitourinary: Negative for frequency and hematuria.  Musculoskeletal: Negative for back pain, gait problem, joint swelling and neck pain.  Skin: Negative for rash.  Neurological: Negative for dizziness, tremors, speech difficulty and weakness.  Psychiatric/Behavioral: Negative for sleep disturbance, dysphoric mood and agitation. The patient is not nervous/anxious.        Objective:   Physical Exam  Constitutional: He is oriented to person, place, and time. He appears well-developed.  HENT:  Mouth/Throat: Oropharynx is clear and moist.  Eyes: Conjunctivae are normal. Pupils are equal, round, and reactive to light.  Neck: Normal range of motion. No JVD present. No thyromegaly present.  Cardiovascular: Normal rate, regular rhythm, normal heart sounds and intact distal pulses.  Exam reveals no gallop and no friction rub.   No murmur heard. Pulmonary/Chest: Effort  normal and breath sounds normal. No respiratory distress. He has no wheezes. He has no rales. He exhibits no tenderness.  Abdominal: Soft. Bowel sounds are normal. He exhibits no distension and no mass. There is no tenderness. There is no rebound and no guarding.  Musculoskeletal: Normal range of motion. He exhibits no edema and no tenderness.  Lymphadenopathy:    He has no cervical adenopathy.  Neurological: He is alert and oriented to person, place, and time. He has normal reflexes. No cranial nerve deficit. He exhibits normal muscle tone. Coordination normal.  Skin: Skin is warm and dry. No rash noted.  Psychiatric: He has a normal mood and affect. His behavior is normal. Judgment and thought content normal.  Hands w/OA      Lab Results  Component Value Date   WBC 8.8 11/11/2012   HGB 14.3 11/11/2012   HCT 42.1 11/11/2012   PLT 212.0 11/11/2012   CHOL 218* 11/11/2012   TRIG 40.0 11/11/2012   HDL 77.00 11/11/2012   LDLDIRECT 135.5 11/11/2012   ALT 20 11/11/2012   AST 22 11/11/2012   NA 139 05/12/2013   K 5.8* 05/12/2013   CL 106 05/12/2013   CREATININE 1.4 05/12/2013   BUN 32* 05/12/2013   CO2 27 05/12/2013   TSH 0.58 11/11/2012   PSA 3.35 11/11/2012     Assessment & Plan:

## 2014-05-15 ENCOUNTER — Ambulatory Visit (INDEPENDENT_AMBULATORY_CARE_PROVIDER_SITE_OTHER): Payer: Commercial Managed Care - HMO | Admitting: Internal Medicine

## 2014-05-15 ENCOUNTER — Other Ambulatory Visit (INDEPENDENT_AMBULATORY_CARE_PROVIDER_SITE_OTHER): Payer: Commercial Managed Care - HMO

## 2014-05-15 ENCOUNTER — Encounter: Payer: Self-pay | Admitting: Internal Medicine

## 2014-05-15 VITALS — BP 142/80 | HR 72 | Temp 97.6°F | Resp 16 | Wt 142.0 lb

## 2014-05-15 DIAGNOSIS — M1990 Unspecified osteoarthritis, unspecified site: Secondary | ICD-10-CM

## 2014-05-15 DIAGNOSIS — I10 Essential (primary) hypertension: Secondary | ICD-10-CM

## 2014-05-15 DIAGNOSIS — E785 Hyperlipidemia, unspecified: Secondary | ICD-10-CM

## 2014-05-15 LAB — BASIC METABOLIC PANEL
BUN: 27 mg/dL — ABNORMAL HIGH (ref 6–23)
CALCIUM: 9.5 mg/dL (ref 8.4–10.5)
CHLORIDE: 105 meq/L (ref 96–112)
CO2: 26 mEq/L (ref 19–32)
CREATININE: 1.1 mg/dL (ref 0.4–1.5)
GFR: 66.94 mL/min (ref >60.00)
Glucose, Bld: 103 mg/dL — ABNORMAL HIGH (ref 70–99)
Potassium: 4.7 mEq/L (ref 3.5–5.1)
Sodium: 139 mEq/L (ref 135–145)

## 2014-05-15 NOTE — Assessment & Plan Note (Signed)
Continue with current prescription therapy as reflected on the Med list.  

## 2014-05-15 NOTE — Progress Notes (Signed)
Pre visit review using our clinic review tool, if applicable. No additional management support is needed unless otherwise documented below in the visit note. 

## 2014-05-15 NOTE — Progress Notes (Signed)
   Subjective:    HPI  The patient is here for a wellness exam. The patient has been doing well overall without major physical or psychological issues going on lately  The patient presents for a follow-up of  chronic hypertension, chronic dyslipidemia, OA controlled with medicines Dizzy spells were corrected w/snacking. He stopped Lovastatin due to side effects  Wt Readings from Last 3 Encounters:  05/15/14 142 lb (64.411 kg)  11/14/13 147 lb (66.679 kg)  05/12/13 142 lb (64.411 kg)   BP Readings from Last 3 Encounters:  05/15/14 142/80  11/14/13 140/82  05/12/13 136/76      Review of Systems  Constitutional: Negative for appetite change, fatigue and unexpected weight change.  HENT: Negative for congestion, nosebleeds, sneezing, sore throat and trouble swallowing.   Eyes: Negative for itching and visual disturbance.  Respiratory: Negative for cough.   Cardiovascular: Negative for chest pain, palpitations and leg swelling.  Gastrointestinal: Negative for nausea, diarrhea, blood in stool and abdominal distention.  Genitourinary: Negative for frequency and hematuria.  Musculoskeletal: Negative for back pain, gait problem, joint swelling and neck pain.  Skin: Negative for rash.  Neurological: Negative for dizziness, tremors, speech difficulty and weakness.  Psychiatric/Behavioral: Negative for sleep disturbance, dysphoric mood and agitation. The patient is not nervous/anxious.        Objective:   Physical Exam  Constitutional: He is oriented to person, place, and time. He appears well-developed.  HENT:  Mouth/Throat: Oropharynx is clear and moist.  Eyes: Conjunctivae are normal. Pupils are equal, round, and reactive to light.  Neck: Normal range of motion. No JVD present. No thyromegaly present.  Cardiovascular: Normal rate, regular rhythm, normal heart sounds and intact distal pulses.  Exam reveals no gallop and no friction rub.   No murmur heard. Pulmonary/Chest: Effort  normal and breath sounds normal. No respiratory distress. He has no wheezes. He has no rales. He exhibits no tenderness.  Abdominal: Soft. Bowel sounds are normal. He exhibits no distension and no mass. There is no tenderness. There is no rebound and no guarding.  Musculoskeletal: Normal range of motion. He exhibits no edema and no tenderness.  Lymphadenopathy:    He has no cervical adenopathy.  Neurological: He is alert and oriented to person, place, and time. He has normal reflexes. No cranial nerve deficit. He exhibits normal muscle tone. Coordination normal.  Skin: Skin is warm and dry. No rash noted.  Psychiatric: He has a normal mood and affect. His behavior is normal. Judgment and thought content normal.  Hands w/OA      Lab Results  Component Value Date   WBC 7.3 11/14/2013   HGB 14.5 11/14/2013   HCT 43.4 11/14/2013   PLT 205.0 11/14/2013   CHOL 229* 11/14/2013   TRIG 55.0 11/14/2013   HDL 76.50 11/14/2013   LDLDIRECT 144.9 11/14/2013   ALT 26 11/14/2013   AST 31 11/14/2013   NA 138 11/14/2013   K 4.9 11/14/2013   CL 104 11/14/2013   CREATININE 1.3 11/14/2013   BUN 24* 11/14/2013   CO2 27 11/14/2013   TSH 0.74 11/14/2013   PSA 3.44 11/14/2013     Assessment & Plan:

## 2014-05-16 ENCOUNTER — Telehealth: Payer: Self-pay | Admitting: Internal Medicine

## 2014-05-16 NOTE — Telephone Encounter (Signed)
Relevant patient education assigned to patient using Emmi. ° °

## 2014-11-09 ENCOUNTER — Encounter: Payer: Self-pay | Admitting: Internal Medicine

## 2014-11-20 ENCOUNTER — Encounter: Payer: Self-pay | Admitting: Internal Medicine

## 2014-11-20 ENCOUNTER — Ambulatory Visit (INDEPENDENT_AMBULATORY_CARE_PROVIDER_SITE_OTHER): Payer: Commercial Managed Care - HMO | Admitting: Internal Medicine

## 2014-11-20 ENCOUNTER — Other Ambulatory Visit (INDEPENDENT_AMBULATORY_CARE_PROVIDER_SITE_OTHER): Payer: Commercial Managed Care - HMO

## 2014-11-20 VITALS — BP 152/90 | HR 63 | Temp 97.5°F | Ht 65.0 in | Wt 145.0 lb

## 2014-11-20 DIAGNOSIS — D485 Neoplasm of uncertain behavior of skin: Secondary | ICD-10-CM

## 2014-11-20 DIAGNOSIS — N320 Bladder-neck obstruction: Secondary | ICD-10-CM

## 2014-11-20 DIAGNOSIS — E785 Hyperlipidemia, unspecified: Secondary | ICD-10-CM

## 2014-11-20 DIAGNOSIS — I10 Essential (primary) hypertension: Secondary | ICD-10-CM

## 2014-11-20 DIAGNOSIS — Z0000 Encounter for general adult medical examination without abnormal findings: Secondary | ICD-10-CM

## 2014-11-20 DIAGNOSIS — M1990 Unspecified osteoarthritis, unspecified site: Secondary | ICD-10-CM

## 2014-11-20 DIAGNOSIS — Z23 Encounter for immunization: Secondary | ICD-10-CM

## 2014-11-20 LAB — LIPID PANEL
CHOLESTEROL: 261 mg/dL — AB (ref 0–200)
HDL: 88.7 mg/dL (ref >39.00)
LDL CALC: 157 mg/dL — AB (ref 0–99)
NONHDL: 172.3
Total CHOL/HDL Ratio: 3
Triglycerides: 77 mg/dL (ref 0.0–149.0)
VLDL: 15.4 mg/dL (ref 0.0–40.0)

## 2014-11-20 LAB — CBC WITH DIFFERENTIAL/PLATELET
BASOS ABS: 0.1 10*3/uL (ref 0.0–0.1)
Basophils Relative: 0.7 % (ref 0.0–3.0)
EOS ABS: 0.2 10*3/uL (ref 0.0–0.7)
Eosinophils Relative: 2 % (ref 0.0–5.0)
HCT: 44.6 % (ref 39.0–52.0)
Hemoglobin: 14.6 g/dL (ref 13.0–17.0)
LYMPHS ABS: 1.7 10*3/uL (ref 0.7–4.0)
Lymphocytes Relative: 20.8 % (ref 12.0–46.0)
MCHC: 32.8 g/dL (ref 30.0–36.0)
MCV: 92.9 fl (ref 78.0–100.0)
MONO ABS: 0.7 10*3/uL (ref 0.1–1.0)
Monocytes Relative: 8.3 % (ref 3.0–12.0)
NEUTROS ABS: 5.6 10*3/uL (ref 1.4–7.7)
Neutrophils Relative %: 68.2 % (ref 43.0–77.0)
PLATELETS: 215 10*3/uL (ref 150.0–400.0)
RBC: 4.8 Mil/uL (ref 4.22–5.81)
RDW: 13 % (ref 11.5–15.5)
WBC: 8.2 10*3/uL (ref 4.0–10.5)

## 2014-11-20 LAB — BASIC METABOLIC PANEL
BUN: 20 mg/dL (ref 6–23)
CALCIUM: 10.1 mg/dL (ref 8.4–10.5)
CO2: 27 mEq/L (ref 19–32)
Chloride: 104 mEq/L (ref 96–112)
Creatinine, Ser: 1.3 mg/dL (ref 0.4–1.5)
GFR: 58.48 mL/min — AB (ref >60.00)
Glucose, Bld: 91 mg/dL (ref 70–99)
Potassium: 5 mEq/L (ref 3.5–5.1)
SODIUM: 139 meq/L (ref 135–145)

## 2014-11-20 LAB — URINALYSIS
BILIRUBIN URINE: NEGATIVE
HGB URINE DIPSTICK: NEGATIVE
KETONES UR: NEGATIVE
Leukocytes, UA: NEGATIVE
Nitrite: NEGATIVE
Specific Gravity, Urine: 1.015 (ref 1.000–1.030)
TOTAL PROTEIN, URINE-UPE24: NEGATIVE
Urine Glucose: NEGATIVE
Urobilinogen, UA: 0.2 (ref 0.0–1.0)
pH: 5.5 (ref 5.0–8.0)

## 2014-11-20 LAB — PSA: PSA: 3.88 ng/mL (ref 0.10–4.00)

## 2014-11-20 LAB — TSH: TSH: 0.75 u[IU]/mL (ref 0.35–4.50)

## 2014-11-20 LAB — HEPATIC FUNCTION PANEL
ALBUMIN: 4.1 g/dL (ref 3.5–5.2)
ALK PHOS: 72 U/L (ref 39–117)
ALT: 22 U/L (ref 0–53)
AST: 31 U/L (ref 0–37)
BILIRUBIN TOTAL: 0.9 mg/dL (ref 0.2–1.2)
Bilirubin, Direct: 0.1 mg/dL (ref 0.0–0.3)
TOTAL PROTEIN: 7.3 g/dL (ref 6.0–8.3)

## 2014-11-20 MED ORDER — LISINOPRIL 20 MG PO TABS: 10.0000 mg | ORAL_TABLET | Freq: Every day | ORAL | Status: DC

## 2014-11-20 NOTE — Assessment & Plan Note (Signed)
No change 

## 2014-11-20 NOTE — Assessment & Plan Note (Signed)
Here for medicare wellness/physical  Diet: heart healthy  Physical activity: active Depression/mood screen: negative  Hearing: intact to whispered voice  Visual acuity: grossly normal, performs annual eye exam  ADLs: capable  Fall risk: none  Home safety: good  Cognitive evaluation: intact to orientation, naming, recall and repetition  EOL planning: adv directives, full code/ I agree  I have personally reviewed and have noted  1. The patient's medical and social history  2. Their use of alcohol, tobacco or illicit drugs  3. Their current medications and supplements  4. The patient's functional ability including ADL's, fall risks, home safety risks and hearing or visual impairment.  5. Diet and physical activities  6. Evidence for depression or mood disorders    Today patient counseled on age appropriate routine health concerns for screening and prevention, each reviewed and up to date or declined. Immunizations reviewed and up to date or declined. Labs ordered and reviewed. Risk factors for depression reviewed and negative. Hearing function and visual acuity are intact. ADLs screened and addressed as needed. Functional ability and level of safety reviewed and appropriate. Education, counseling and referrals performed based on assessed risks today. Patient provided with a copy of personalized plan for preventive services.

## 2014-11-20 NOTE — Progress Notes (Signed)
Pre visit review using our clinic review tool, if applicable. No additional management support is needed unless otherwise documented below in the visit note. 

## 2014-11-20 NOTE — Assessment & Plan Note (Signed)
Skin bx R temple

## 2014-11-20 NOTE — Assessment & Plan Note (Signed)
Monitor BP at home Normal BP<130/85

## 2014-11-20 NOTE — Assessment & Plan Note (Signed)
On fish oil 

## 2014-11-20 NOTE — Patient Instructions (Addendum)
Monitor BP at home Normal BP<130/85   Health Maintenance A healthy lifestyle and preventative care can promote health and wellness.  Maintain regular health, dental, and eye exams.  Eat a healthy diet. Foods like vegetables, fruits, whole grains, low-fat dairy products, and lean protein foods contain the nutrients you need and are low in calories. Decrease your intake of foods high in solid fats, added sugars, and salt. Get information about a proper diet from your health care provider, if necessary.  Regular physical exercise is one of the most important things you can do for your health. Most adults should get at least 150 minutes of moderate-intensity exercise (any activity that increases your heart rate and causes you to sweat) each week. In addition, most adults need muscle-strengthening exercises on 2 or more days a week.   Maintain a healthy weight. The body mass index (BMI) is a screening tool to identify possible weight problems. It provides an estimate of body fat based on height and weight. Your health care provider can find your BMI and can help you achieve or maintain a healthy weight. For males 20 years and older:  A BMI below 18.5 is considered underweight.  A BMI of 18.5 to 24.9 is normal.  A BMI of 25 to 29.9 is considered overweight.  A BMI of 30 and above is considered obese.  Maintain normal blood lipids and cholesterol by exercising and minimizing your intake of saturated fat. Eat a balanced diet with plenty of fruits and vegetables. Blood tests for lipids and cholesterol should begin at age 55 and be repeated every 5 years. If your lipid or cholesterol levels are high, you are over age 32, or you are at high risk for heart disease, you may need your cholesterol levels checked more frequently.Ongoing high lipid and cholesterol levels should be treated with medicines if diet and exercise are not working.  If you smoke, find out from your health care provider how to  quit. If you do not use tobacco, do not start.  Lung cancer screening is recommended for adults aged 44-80 years who are at high risk for developing lung cancer because of a history of smoking. A yearly low-dose CT scan of the lungs is recommended for people who have at least a 30-pack-year history of smoking and are current smokers or have quit within the past 15 years. A pack year of smoking is smoking an average of 1 pack of cigarettes a day for 1 year (for example, a 30-pack-year history of smoking could mean smoking 1 pack a day for 30 years or 2 packs a day for 15 years). Yearly screening should continue until the smoker has stopped smoking for at least 15 years. Yearly screening should be stopped for people who develop a health problem that would prevent them from having lung cancer treatment.  If you choose to drink alcohol, do not have more than 2 drinks per day. One drink is considered to be 12 oz (360 mL) of beer, 5 oz (150 mL) of wine, or 1.5 oz (45 mL) of liquor.  Avoid the use of street drugs. Do not share needles with anyone. Ask for help if you need support or instructions about stopping the use of drugs.  High blood pressure causes heart disease and increases the risk of stroke. Blood pressure should be checked at least every 1-2 years. Ongoing high blood pressure should be treated with medicines if weight loss and exercise are not effective.  If you are 45-79  years old, ask your health care provider if you should take aspirin to prevent heart disease.  Diabetes screening involves taking a blood sample to check your fasting blood sugar level. This should be done once every 3 years after age 69 if you are at a normal weight and without risk factors for diabetes. Testing should be considered at a younger age or be carried out more frequently if you are overweight and have at least 1 risk factor for diabetes.  Colorectal cancer can be detected and often prevented. Most routine colorectal  cancer screening begins at the age of 42 and continues through age 75. However, your health care provider may recommend screening at an earlier age if you have risk factors for colon cancer. On a yearly basis, your health care provider may provide home test kits to check for hidden blood in the stool. A small camera at the end of a tube may be used to directly examine the colon (sigmoidoscopy or colonoscopy) to detect the earliest forms of colorectal cancer. Talk to your health care provider about this at age 60 when routine screening begins. A direct exam of the colon should be repeated every 5-10 years through age 61, unless early forms of precancerous polyps or small growths are found.  People who are at an increased risk for hepatitis B should be screened for this virus. You are considered at high risk for hepatitis B if:  You were born in a country where hepatitis B occurs often. Talk with your health care provider about which countries are considered high risk.  Your parents were born in a high-risk country and you have not received a shot to protect against hepatitis B (hepatitis B vaccine).  You have HIV or AIDS.  You use needles to inject street drugs.  You live with, or have sex with, someone who has hepatitis B.  You are a man who has sex with other men (MSM).  You get hemodialysis treatment.  You take certain medicines for conditions like cancer, organ transplantation, and autoimmune conditions.  Hepatitis C blood testing is recommended for all people born from [birth year] through 1965 and any individual with known risk factors for hepatitis C.  Healthy men should no longer receive prostate-specific antigen (PSA) blood tests as part of routine cancer screening. Talk to your health care provider about prostate cancer screening.  Testicular cancer screening is not recommended for adolescents or adult males who have no symptoms. Screening includes self-exam, a health care provider exam, and  other screening tests. Consult with your health care provider about any symptoms you have or any concerns you have about testicular cancer.  Practice safe sex. Use condoms and avoid high-risk sexual practices to reduce the spread of sexually transmitted infections (STIs).  You should be screened for STIs, including gonorrhea and chlamydia if:  You are sexually active and are younger than 24 years.  You are older than 24 years, and your health care provider tells you that you are at risk for this type of infection.  Your sexual activity has changed since you were last screened, and you are at an increased risk for chlamydia or gonorrhea. Ask your health care provider if you are at risk.  If you are at risk of being infected with HIV, it is recommended that you take a prescription medicine daily to prevent HIV infection. This is called pre-exposure prophylaxis (PrEP). You are considered at risk if:  You are a man who has sex with other men (  MSM).  You are a heterosexual man who is sexually active with multiple partners.  You take drugs by injection.  You are sexually active with a partner who has HIV.  Talk with your health care provider about whether you are at high risk of being infected with HIV. If you choose to begin PrEP, you should first be tested for HIV. You should then be tested every 3 months for as long as you are taking PrEP.  Use sunscreen. Apply sunscreen liberally and repeatedly throughout the day. You should seek shade when your shadow is shorter than you. Protect yourself by wearing long sleeves, pants, a wide-brimmed hat, and sunglasses year round whenever you are outdoors.  Tell your health care provider of new moles or changes in moles, especially if there is a change in shape or color. Also, tell your health care provider if a mole is larger than the size of a pencil eraser.  A one-time screening for abdominal aortic aneurysm (AAA) and surgical repair of large AAAs by  ultrasound is recommended for men aged 42-75 years who are current or former smokers.  Stay current with your vaccines (immunizations). Document Released: 05/07/2008 Document Revised: 11/14/2013 Document Reviewed: 04/06/2011 [facility] Patient Information 2015 [place], [place]. This information is not intended to replace advice given to you by your health care provider. Make sure you discuss any questions you have with your health care provider.

## 2014-11-20 NOTE — Progress Notes (Signed)
   Subjective:    HPI  The patient is here for a wellness exam. The patient has been doing well overall without major physical or psychological issues going on lately.  The patient presents for a follow-up of  chronic hypertension, chronic dyslipidemia, OA controlled with medicines. Pt states BP is nl at home... Dizzy spells were corrected w/snacking. He stopped Lovastatin due to side effects  Wt Readings from Last 3 Encounters:  11/20/14 145 lb (65.772 kg)  05/15/14 142 lb (64.411 kg)  11/14/13 147 lb (66.679 kg)   BP Readings from Last 3 Encounters:  11/20/14 152/90  05/15/14 142/80  11/14/13 140/82      Review of Systems  Constitutional: Negative for appetite change, fatigue and unexpected weight change.  HENT: Negative for congestion, nosebleeds, sneezing, sore throat and trouble swallowing.   Eyes: Negative for itching and visual disturbance.  Respiratory: Negative for cough.   Cardiovascular: Negative for chest pain, palpitations and leg swelling.  Gastrointestinal: Negative for nausea, diarrhea, blood in stool and abdominal distention.  Genitourinary: Negative for frequency and hematuria.  Musculoskeletal: Negative for back pain, joint swelling, gait problem and neck pain.  Skin: Negative for rash.  Neurological: Negative for dizziness, tremors, speech difficulty and weakness.  Psychiatric/Behavioral: Negative for sleep disturbance, dysphoric mood and agitation. The patient is not nervous/anxious.        Objective:   Physical Exam  Constitutional: He is oriented to person, place, and time. He appears well-developed. No distress.  NAD  HENT:  Mouth/Throat: Oropharynx is clear and moist.  Eyes: Conjunctivae are normal. Pupils are equal, round, and reactive to light.  Neck: Normal range of motion. No JVD present. No thyromegaly present.  Cardiovascular: Normal rate, regular rhythm, normal heart sounds and intact distal pulses.  Exam reveals no gallop and no friction  rub.   No murmur heard. Pulmonary/Chest: Effort normal and breath sounds normal. No respiratory distress. He has no wheezes. He has no rales. He exhibits no tenderness.  Abdominal: Soft. Bowel sounds are normal. He exhibits no distension and no mass. There is no tenderness. There is no rebound and no guarding.  Musculoskeletal: Normal range of motion. He exhibits no edema or tenderness.  Lymphadenopathy:    He has no cervical adenopathy.  Neurological: He is alert and oriented to person, place, and time. He has normal reflexes. No cranial nerve deficit. He exhibits normal muscle tone. He displays a negative Romberg sign. Coordination and gait normal.  Skin: Skin is warm and dry. No rash noted.  Psychiatric: He has a normal mood and affect. His behavior is normal. Judgment and thought content normal.  Hands w/OA R temple mole      Lab Results  Component Value Date   WBC 7.3 11/14/2013   HGB 14.5 11/14/2013   HCT 43.4 11/14/2013   PLT 205.0 11/14/2013   CHOL 229* 11/14/2013   TRIG 55.0 11/14/2013   HDL 76.50 11/14/2013   LDLDIRECT 144.9 11/14/2013   ALT 26 11/14/2013   AST 31 11/14/2013   NA 139 05/15/2014   K 4.7 05/15/2014   CL 105 05/15/2014   CREATININE 1.1 05/15/2014   BUN 27* 05/15/2014   CO2 26 05/15/2014   TSH 0.74 11/14/2013   PSA 3.44 11/14/2013     Assessment & Plan:

## 2014-12-04 ENCOUNTER — Encounter: Payer: Self-pay | Admitting: Internal Medicine

## 2014-12-04 ENCOUNTER — Ambulatory Visit (INDEPENDENT_AMBULATORY_CARE_PROVIDER_SITE_OTHER): Payer: Commercial Managed Care - HMO | Admitting: Internal Medicine

## 2014-12-04 VITALS — BP 130/76 | HR 68 | Temp 97.4°F

## 2014-12-04 DIAGNOSIS — D485 Neoplasm of uncertain behavior of skin: Secondary | ICD-10-CM

## 2014-12-04 DIAGNOSIS — D489 Neoplasm of uncertain behavior, unspecified: Secondary | ICD-10-CM | POA: Diagnosis not present

## 2014-12-04 DIAGNOSIS — L570 Actinic keratosis: Secondary | ICD-10-CM

## 2014-12-04 DIAGNOSIS — L821 Other seborrheic keratosis: Secondary | ICD-10-CM | POA: Diagnosis not present

## 2014-12-04 DIAGNOSIS — D2262 Melanocytic nevi of left upper limb, including shoulder: Secondary | ICD-10-CM | POA: Diagnosis not present

## 2014-12-04 NOTE — Progress Notes (Signed)
Procedure Note :     Procedure :  Skin biopsy   Indication:  Changing mole (s ),  Suspicious lesion(s)   Risks including unsuccessful procedure , bleeding, infection, bruising, scar, a need for another complete procedure and others were explained to the patient in detail as well as the benefits. Informed consent was obtained and signed.   The patient was placed in a decubitus position.  Lesion #1 on  R temple   measuring  11x5   mm   Skin over lesion #1  was prepped with Betadine and alcohol  and anesthetized with 1 cc of 2% lidocaine and epinephrine, using a 25-gauge 1 inch needle.  Shave biopsy with a sterile Dermablade was carried out in the usual fashion. Hyfrecator was used to destroy the rest of the lesion potentially left behind and for hemostasis. Band-Aid was applied with antibiotic ointment.    Lesion #2 on  L shoulder   measuring 3x3  mm   Skin over lesion #2  was prepped with Betadine and alcohol  and anesthetized with 1/2 cc of 2% lidocaine and epinephrine, using a 25-gauge 1 inch needle.  Shave biopsy with a sterile Dermablade was carried out in the usual fashion. Hyfrecator was used to destroy the rest of the lesion potentially left behind and for hemostasis. Band-Aid was applied with antibiotic ointment.    Tolerated well. Complications none.      Postprocedure instructions :    A Band-Aid should be  changed twice daily. You can take a shower tomorrow.  Keep the wounds clean. You can wash them with liquid soap and water. Pat dry with gauze or a Kleenex tissue  Before applying antibiotic ointment and a Band-Aid.   You need to report immediately  if fever, chills or any signs of infection develop.    The biopsy results should be available in 1 -2 weeks.   Procedure Note :     Procedure : Cryosurgery   Indication: Actinic keratosis(es)   Risks including unsuccessful procedure , bleeding, infection, bruising, scar, a need for a repeat  procedure and others were  explained to the patient in detail as well as the benefits. Informed consent was obtained verbally.    1 lesion(s)  on R temple   was/were treated with liquid nitrogen on a Q-tip in a usual fasion . Band-Aid was applied and antibiotic ointment was given for a later use.   Tolerated well. Complications none.   Postprocedure instructions :     Keep the wounds clean. You can wash them with liquid soap and water. Pat dry with gauze or a Kleenex tissue  Before applying antibiotic ointment and a Band-Aid.   You need to report immediately  if  any signs of infection develop.

## 2014-12-09 DIAGNOSIS — L570 Actinic keratosis: Secondary | ICD-10-CM | POA: Insufficient documentation

## 2014-12-09 NOTE — Assessment & Plan Note (Signed)
See Procedures.

## 2014-12-09 NOTE — Assessment & Plan Note (Signed)
See Procedure 

## 2015-02-12 ENCOUNTER — Encounter: Payer: Self-pay | Admitting: Gastroenterology

## 2015-02-12 ENCOUNTER — Encounter: Payer: Self-pay | Admitting: Internal Medicine

## 2015-02-14 ENCOUNTER — Other Ambulatory Visit: Payer: Self-pay | Admitting: Internal Medicine

## 2015-02-14 DIAGNOSIS — Z1211 Encounter for screening for malignant neoplasm of colon: Secondary | ICD-10-CM

## 2015-04-01 ENCOUNTER — Ambulatory Visit (AMBULATORY_SURGERY_CENTER): Payer: Self-pay | Admitting: *Deleted

## 2015-04-01 ENCOUNTER — Encounter: Payer: Self-pay | Admitting: Gastroenterology

## 2015-04-01 VITALS — Ht 65.0 in | Wt 148.8 lb

## 2015-04-01 DIAGNOSIS — Z86010 Personal history of colonic polyps: Secondary | ICD-10-CM

## 2015-04-01 NOTE — Progress Notes (Signed)
No issues with past sedation No egg or soy allergy No diet pills No home 02 use emmi video to e [email or web address]

## 2015-04-12 ENCOUNTER — Encounter: Payer: Self-pay | Admitting: Internal Medicine

## 2015-04-15 ENCOUNTER — Encounter: Payer: Self-pay | Admitting: Gastroenterology

## 2015-04-15 ENCOUNTER — Other Ambulatory Visit: Payer: Self-pay | Admitting: Internal Medicine

## 2015-04-15 ENCOUNTER — Telehealth: Payer: Self-pay

## 2015-04-15 ENCOUNTER — Ambulatory Visit: Payer: Commercial Managed Care - HMO | Admitting: Gastroenterology

## 2015-04-15 VITALS — BP 127/70 | HR 67 | Temp 96.0°F | Resp 14 | Ht 65.0 in | Wt 148.0 lb

## 2015-04-15 DIAGNOSIS — Z86010 Personal history of colonic polyps: Secondary | ICD-10-CM

## 2015-04-15 DIAGNOSIS — D124 Benign neoplasm of descending colon: Secondary | ICD-10-CM

## 2015-04-15 DIAGNOSIS — I10 Essential (primary) hypertension: Secondary | ICD-10-CM | POA: Diagnosis not present

## 2015-04-15 DIAGNOSIS — K635 Polyp of colon: Secondary | ICD-10-CM | POA: Diagnosis not present

## 2015-04-15 DIAGNOSIS — D120 Benign neoplasm of cecum: Secondary | ICD-10-CM

## 2015-04-15 DIAGNOSIS — K6389 Other specified diseases of intestine: Secondary | ICD-10-CM | POA: Diagnosis not present

## 2015-04-15 DIAGNOSIS — H539 Unspecified visual disturbance: Secondary | ICD-10-CM

## 2015-04-15 MED ORDER — SODIUM CHLORIDE 0.9 % IV SOLN: 500.0000 mL | INTRAVENOUS | Status: DC

## 2015-04-15 NOTE — Telephone Encounter (Signed)
My office will refer you to a surgeon to consider extended appedectomy, ?segmental cecectomy for the polyp that has clearly proven to be not completely resectable by colonoscopy.

## 2015-04-15 NOTE — Progress Notes (Signed)
Report to PACU, RN, vss, BBS= Clear.  

## 2015-04-15 NOTE — Progress Notes (Signed)
Called to room to assist during endoscopic procedure.  Patient ID and intended procedure confirmed with present staff. Received instructions for my participation in the procedure from the performing physician.  

## 2015-04-15 NOTE — Patient Instructions (Signed)
Discharge instructions given. Biopsies taken. Handouts on polyps and diverticulosis. Resume previous medications. YOU HAD AN ENDOSCOPIC PROCEDURE TODAY AT [facility]:   Refer to the procedure report that was given to you for any specific questions about what was found during the examination.  If the procedure report does not answer your questions, please call your gastroenterologist to clarify.  If you requested that your care partner not be given the details of your procedure findings, then the procedure report has been included in a sealed envelope for you to review at your convenience later.  YOU SHOULD EXPECT: Some feelings of bloating in the abdomen. Passage of more gas than usual.  Walking can help get rid of the air that was put into your GI tract during the procedure and reduce the bloating. If you had a lower endoscopy (such as a colonoscopy or flexible sigmoidoscopy) you may notice spotting of blood in your stool or on the toilet paper. If you underwent a bowel prep for your procedure, you may not have a normal bowel movement for a few days.  Please Note:  You might notice some irritation and congestion in your nose or some drainage.  This is from the oxygen used during your procedure.  There is no need for concern and it should clear up in a day or so.  SYMPTOMS TO REPORT IMMEDIATELY:   Following lower endoscopy (colonoscopy or flexible sigmoidoscopy):  Excessive amounts of blood in the stool  Significant tenderness or worsening of abdominal pains  Swelling of the abdomen that is new, acute  Fever of 100F or higher  For urgent or emergent issues, a gastroenterologist can be reached at any hour by calling [phone].   DIET: Your first meal following the procedure should be a small meal and then it is ok to progress to your normal diet. Heavy or fried foods are harder to digest and may make you feel nauseous or bloated.  Likewise, meals heavy in dairy and  vegetables can increase bloating.  Drink plenty of fluids but you should avoid alcoholic beverages for 24 hours.  ACTIVITY:  You should plan to take it easy for the rest of today and you should NOT DRIVE or use heavy machinery until tomorrow (because of the sedation medicines used during the test).    FOLLOW UP: Our staff will call the number listed on your records the next business day following your procedure to check on you and address any questions or concerns that you may have regarding the information given to you following your procedure. If we do not reach you, we will leave a message.  However, if you are feeling well and you are not experiencing any problems, there is no need to return our call.  We will assume that you have returned to your regular daily activities without incident.  If any biopsies were taken you will be contacted by phone or by letter within the next 1-3 weeks.  Please call us at [phone] if you have not heard about the biopsies in 3 weeks.    SIGNATURES/CONFIDENTIALITY: You and/or your care partner have signed paperwork which will be entered into your electronic medical record.  These signatures attest to the fact that that the information above on your After Visit Summary has been reviewed and is understood.  Full responsibility of the confidentiality of this discharge information lies with you and/or your care-partner.

## 2015-04-15 NOTE — Op Note (Signed)
[facility]  [address] [place], [address]   COLONOSCOPY PROCEDURE REPORT  PATIENT: Sergio Hughes, Sergio Hughes  MR#: [id number] BIRTHDATE: Mar 10, 1941 , 73  yrs. old GENDER: male ENDOSCOPIST: Milus Banister, MD PROCEDURE DATE:  04/15/2015 PROCEDURE:   Colonoscopy with snare polypectomy, Colonoscopy with biopsy, and Colonoscopy, surveillance First Screening Colonoscopy - Avg.  risk and is 50 yrs.  old or older - No.  Prior Negative Screening - Now for repeat screening. N/A  History of Adenoma - Now for follow-up colonoscopy & has been > or = to 3 yrs.  Yes hx of adenoma.  Has been 3 or more years since last colonoscopy.  Polyps removed today? Yes ASA CLASS:   Class II INDICATIONS:Surveillance due to prior colonic neoplasia; recurrent cecal polyp since at least 2002; multiple colonoscopies Dr. 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 MEDICATIONS: Monitored anesthesia care and Propofol 200 mg IV  DESCRIPTION OF PROCEDURE:   After the risks benefits and alternatives of the procedure were thoroughly explained, informed consent was obtained.  The digital rectal exam revealed no abnormalities of the rectum.   The LB [id number]  endoscope was introduced through the anus and advanced to the cecum, which was identified by both the appendix and ileocecal valve. No adverse events experienced.   The quality of the prep was excellent.  The instrument was then slowly withdrawn as the colon was fully examined. Estimated blood loss is zero unless otherwise noted in this procedure report.  COLON FINDINGS: There was 2cm of soft, villous appearing polyp at the appendiceal orifice, seemed to be within the appendiceal orifice as well. This was biopsied and sent to pathology.   A sessile polyp measuring 4 mm in size was found in the sigmoid colon.  A polypectomy was performed with a cold snare.  The resection was  complete, the polyp tissue was completely retrieved and sent to histology.   There was mild diverticulosis noted in the left colon.   The examination was otherwise normal.  Retroflexed views revealed no abnormalities. The time to cecum = 3.10min Withdrawal time = 9.21min   The scope was withdrawn and the procedure completed. COMPLICATIONS: There were no immediate complications.  ENDOSCOPIC IMPRESSION: Recurrent cecal (appendiceal orifice polyp). This was biopsied. Small polyp in sigmoid, removed with cold snare. Diverticulosis  RECOMMENDATIONS: Await final pathology. My office will refer you to a surgeon to consider extended appedectomy, ?segmental cecectomy for the polyp that has clearly proven to be not completely resectable by colonoscopy.  This may be possible to be done laparoscopically.  eSigned:  Milus Banister, MD 04/15/2015 11:31 AM   cc: Walker Kehr, MD

## 2015-04-16 ENCOUNTER — Telehealth: Payer: Self-pay

## 2015-04-16 NOTE — Telephone Encounter (Signed)
  Follow up Call-  Call back number 04/15/2015  Post procedure Call Back phone  # [phone]  Permission to leave phone message Yes     Patient questions:  Do you have a fever, pain , or abdominal swelling? No. Pain Score  0 *  Have you tolerated food without any problems? Yes.    Have you been able to return to your normal activities? Yes.    Do you have any questions about your discharge instructions: Diet   No. Medications  No. Follow up visit  No.  Do you have questions or concerns about your Care? No.  Actions: * If pain score is 4 or above: No action needed, pain <4.

## 2015-04-18 NOTE — Telephone Encounter (Signed)
appt with Dr Hulen Skains [facility] on 04/30/15 at 1010 am the pt and his wife have been notified.

## 2015-04-23 DIAGNOSIS — H25013 Cortical age-related cataract, bilateral: Secondary | ICD-10-CM | POA: Diagnosis not present

## 2015-04-23 DIAGNOSIS — H2513 Age-related nuclear cataract, bilateral: Secondary | ICD-10-CM | POA: Diagnosis not present

## 2015-04-30 DIAGNOSIS — D369 Benign neoplasm, unspecified site: Secondary | ICD-10-CM | POA: Diagnosis not present

## 2015-05-22 ENCOUNTER — Other Ambulatory Visit (INDEPENDENT_AMBULATORY_CARE_PROVIDER_SITE_OTHER): Payer: Commercial Managed Care - HMO

## 2015-05-22 ENCOUNTER — Ambulatory Visit (INDEPENDENT_AMBULATORY_CARE_PROVIDER_SITE_OTHER): Payer: Commercial Managed Care - HMO | Admitting: Internal Medicine

## 2015-05-22 ENCOUNTER — Encounter: Payer: Self-pay | Admitting: Internal Medicine

## 2015-05-22 VITALS — BP 128/82 | HR 76 | Wt 142.0 lb

## 2015-05-22 DIAGNOSIS — Z0000 Encounter for general adult medical examination without abnormal findings: Secondary | ICD-10-CM | POA: Diagnosis not present

## 2015-05-22 DIAGNOSIS — E785 Hyperlipidemia, unspecified: Secondary | ICD-10-CM

## 2015-05-22 DIAGNOSIS — K635 Polyp of colon: Secondary | ICD-10-CM | POA: Insufficient documentation

## 2015-05-22 DIAGNOSIS — M159 Polyosteoarthritis, unspecified: Secondary | ICD-10-CM

## 2015-05-22 DIAGNOSIS — M150 Primary generalized (osteo)arthritis: Secondary | ICD-10-CM

## 2015-05-22 DIAGNOSIS — I10 Essential (primary) hypertension: Secondary | ICD-10-CM

## 2015-05-22 DIAGNOSIS — N528 Other male erectile dysfunction: Secondary | ICD-10-CM | POA: Diagnosis not present

## 2015-05-22 DIAGNOSIS — N320 Bladder-neck obstruction: Secondary | ICD-10-CM

## 2015-05-22 LAB — BASIC METABOLIC PANEL
BUN: 30 mg/dL — AB (ref 6–23)
CO2: 26 mEq/L (ref 19–32)
CREATININE: 1.62 mg/dL — AB (ref 0.40–1.50)
Calcium: 9.8 mg/dL (ref 8.4–10.5)
Chloride: 105 mEq/L (ref 96–112)
GFR: 44.5 mL/min — AB (ref >60.00)
Glucose, Bld: 96 mg/dL (ref 70–99)
POTASSIUM: 4.9 meq/L (ref 3.5–5.1)
Sodium: 140 mEq/L (ref 135–145)

## 2015-05-22 MED ORDER — LISINOPRIL 20 MG PO TABS: 10.0000 mg | ORAL_TABLET | Freq: Every day | ORAL | Status: DC

## 2015-05-22 MED ORDER — IBUPROFEN 600 MG PO TABS: 600.0000 mg | ORAL_TABLET | Freq: Two times a day (BID) | ORAL | Status: DC | PRN

## 2015-05-22 NOTE — Assessment & Plan Note (Signed)
Lisinopril 

## 2015-05-22 NOTE — Assessment & Plan Note (Signed)
Cialis prn 

## 2015-05-22 NOTE — Progress Notes (Signed)
   Subjective:    HPI   The patient presents for a follow-up of  chronic hypertension, chronic dyslipidemia, OA controlled with medicines Dizzy spells were corrected w/snacking. He stopped Lovastatin due to side effects  Wt Readings from Last 3 Encounters:  05/22/15 142 lb (64.411 kg)  04/15/15 148 lb (67.132 kg)  04/01/15 148 lb 12.8 oz (67.495 kg)   BP Readings from Last 3 Encounters:  05/22/15 128/82  04/15/15 127/70  12/04/14 130/76      Review of Systems  Constitutional: Negative for appetite change, fatigue and unexpected weight change.  HENT: Negative for congestion, nosebleeds, sneezing, sore throat and trouble swallowing.   Eyes: Negative for itching and visual disturbance.  Respiratory: Negative for cough.   Cardiovascular: Negative for chest pain, palpitations and leg swelling.  Gastrointestinal: Negative for nausea, diarrhea, blood in stool and abdominal distention.  Genitourinary: Negative for frequency and hematuria.  Musculoskeletal: Negative for back pain, joint swelling, gait problem and neck pain.  Skin: Negative for rash.  Neurological: Negative for dizziness, tremors, speech difficulty and weakness.  Psychiatric/Behavioral: Negative for sleep disturbance, dysphoric mood and agitation. The patient is not nervous/anxious.        Objective:   Physical Exam  Constitutional: He is oriented to person, place, and time. He appears well-developed.  HENT:  Mouth/Throat: Oropharynx is clear and moist.  Eyes: Conjunctivae are normal. Pupils are equal, round, and reactive to light.  Neck: Normal range of motion. No JVD present. No thyromegaly present.  Cardiovascular: Normal rate, regular rhythm, normal heart sounds and intact distal pulses.  Exam reveals no gallop and no friction rub.   No murmur heard. Pulmonary/Chest: Effort normal and breath sounds normal. No respiratory distress. He has no wheezes. He has no rales. He exhibits no tenderness.  Abdominal:  Soft. Bowel sounds are normal. He exhibits no distension and no mass. There is no tenderness. There is no rebound and no guarding.  Musculoskeletal: Normal range of motion. He exhibits no edema or tenderness.  Lymphadenopathy:    He has no cervical adenopathy.  Neurological: He is alert and oriented to person, place, and time. He has normal reflexes. No cranial nerve deficit. He exhibits normal muscle tone. Coordination normal.  Skin: Skin is warm and dry. No rash noted.  Psychiatric: He has a normal mood and affect. His behavior is normal. Judgment and thought content normal.  Hands w/OA      Lab Results  Component Value Date   WBC 8.2 11/20/2014   HGB 14.6 11/20/2014   HCT 44.6 11/20/2014   PLT 215.0 11/20/2014   CHOL 261* 11/20/2014   TRIG 77.0 11/20/2014   HDL 88.70 11/20/2014   LDLDIRECT 144.9 11/14/2013   ALT 22 11/20/2014   AST 31 11/20/2014   NA 139 11/20/2014   K 5.0 11/20/2014   CL 104 11/20/2014   CREATININE 1.3 11/20/2014   BUN 20 11/20/2014   CO2 27 11/20/2014   TSH 0.75 11/20/2014   PSA 3.88 11/20/2014     Assessment & Plan:

## 2015-05-22 NOTE — Assessment & Plan Note (Signed)
B hands OA Ibuprofen 600 mg prn Rx

## 2015-05-22 NOTE — Assessment & Plan Note (Signed)
Dr Ardis Hughs 2016 - colectomy is being planned

## 2015-05-22 NOTE — Progress Notes (Signed)
Pre visit review using our clinic review tool, if applicable. No additional management support is needed unless otherwise documented below in the visit note. 

## 2015-07-23 ENCOUNTER — Encounter: Payer: Self-pay | Admitting: Internal Medicine

## 2015-09-30 ENCOUNTER — Encounter: Payer: Self-pay | Admitting: Gastroenterology

## 2015-11-12 ENCOUNTER — Ambulatory Visit: Payer: Self-pay | Admitting: General Surgery

## 2015-11-22 ENCOUNTER — Encounter: Payer: Commercial Managed Care - HMO | Admitting: Internal Medicine

## 2015-12-06 ENCOUNTER — Other Ambulatory Visit (INDEPENDENT_AMBULATORY_CARE_PROVIDER_SITE_OTHER): Payer: Commercial Managed Care - HMO

## 2015-12-06 ENCOUNTER — Ambulatory Visit (INDEPENDENT_AMBULATORY_CARE_PROVIDER_SITE_OTHER): Payer: Commercial Managed Care - HMO | Admitting: Internal Medicine

## 2015-12-06 ENCOUNTER — Encounter: Payer: Self-pay | Admitting: Internal Medicine

## 2015-12-06 VITALS — BP 132/70 | HR 69 | Ht 64.5 in | Wt 147.0 lb

## 2015-12-06 DIAGNOSIS — Z0000 Encounter for general adult medical examination without abnormal findings: Secondary | ICD-10-CM

## 2015-12-06 DIAGNOSIS — E785 Hyperlipidemia, unspecified: Secondary | ICD-10-CM

## 2015-12-06 DIAGNOSIS — K635 Polyp of colon: Secondary | ICD-10-CM | POA: Diagnosis not present

## 2015-12-06 DIAGNOSIS — D485 Neoplasm of uncertain behavior of skin: Secondary | ICD-10-CM

## 2015-12-06 DIAGNOSIS — N320 Bladder-neck obstruction: Secondary | ICD-10-CM | POA: Diagnosis not present

## 2015-12-06 DIAGNOSIS — I10 Essential (primary) hypertension: Secondary | ICD-10-CM | POA: Diagnosis not present

## 2015-12-06 LAB — CBC WITH DIFFERENTIAL/PLATELET
BASOS PCT: 0.3 % (ref 0.0–3.0)
Basophils Absolute: 0 10*3/uL (ref 0.0–0.1)
EOS PCT: 2 % (ref 0.0–5.0)
Eosinophils Absolute: 0.2 10*3/uL (ref 0.0–0.7)
HEMATOCRIT: 44.5 % (ref 39.0–52.0)
HEMOGLOBIN: 14.6 g/dL (ref 13.0–17.0)
Lymphocytes Relative: 21.1 % (ref 12.0–46.0)
Lymphs Abs: 2.1 10*3/uL (ref 0.7–4.0)
MCHC: 32.9 g/dL (ref 30.0–36.0)
MCV: 92.8 fl (ref 78.0–100.0)
MONO ABS: 0.8 10*3/uL (ref 0.1–1.0)
MONOS PCT: 8.2 % (ref 3.0–12.0)
Neutro Abs: 6.8 10*3/uL (ref 1.4–7.7)
Neutrophils Relative %: 68.4 % (ref 43.0–77.0)
Platelets: 243 10*3/uL (ref 150.0–400.0)
RBC: 4.8 Mil/uL (ref 4.22–5.81)
RDW: 13 % (ref 11.5–15.5)
WBC: 9.9 10*3/uL (ref 4.0–10.5)

## 2015-12-06 LAB — LIPID PANEL
CHOLESTEROL: 249 mg/dL — AB (ref 0–200)
HDL: 76.8 mg/dL (ref >39.00)
LDL Cholesterol: 160 mg/dL — ABNORMAL HIGH (ref 0–99)
NonHDL: 172.32
TRIGLYCERIDES: 61 mg/dL (ref 0.0–149.0)
Total CHOL/HDL Ratio: 3
VLDL: 12.2 mg/dL (ref 0.0–40.0)

## 2015-12-06 LAB — URINALYSIS
Bilirubin Urine: NEGATIVE
Hgb urine dipstick: NEGATIVE
Ketones, ur: NEGATIVE
Leukocytes, UA: NEGATIVE
NITRITE: NEGATIVE
PH: 5.5 (ref 5.0–8.0)
SPECIFIC GRAVITY, URINE: 1.015 (ref 1.000–1.030)
TOTAL PROTEIN, URINE-UPE24: NEGATIVE
URINE GLUCOSE: NEGATIVE
Urobilinogen, UA: 0.2 (ref 0.0–1.0)

## 2015-12-06 LAB — HEPATIC FUNCTION PANEL
ALBUMIN: 4.2 g/dL (ref 3.5–5.2)
ALT: 26 U/L (ref 0–53)
AST: 30 U/L (ref 0–37)
Alkaline Phosphatase: 73 U/L (ref 39–117)
BILIRUBIN TOTAL: 0.6 mg/dL (ref 0.2–1.2)
Bilirubin, Direct: 0.1 mg/dL (ref 0.0–0.3)
TOTAL PROTEIN: 7.3 g/dL (ref 6.0–8.3)

## 2015-12-06 LAB — BASIC METABOLIC PANEL
BUN: 35 mg/dL — AB (ref 6–23)
CHLORIDE: 102 meq/L (ref 96–112)
CO2: 28 mEq/L (ref 19–32)
Calcium: 9.9 mg/dL (ref 8.4–10.5)
Creatinine, Ser: 1.53 mg/dL — ABNORMAL HIGH (ref 0.40–1.50)
GFR: 47.46 mL/min — ABNORMAL LOW (ref >60.00)
GLUCOSE: 86 mg/dL (ref 70–99)
POTASSIUM: 5 meq/L (ref 3.5–5.1)
SODIUM: 139 meq/L (ref 135–145)

## 2015-12-06 LAB — PSA: PSA: 5.08 ng/mL — AB (ref 0.10–4.00)

## 2015-12-06 LAB — TSH: TSH: 0.51 u[IU]/mL (ref 0.35–4.50)

## 2015-12-06 MED ORDER — LISINOPRIL 20 MG PO TABS: 10.0000 mg | ORAL_TABLET | Freq: Every day | ORAL | Status: DC

## 2015-12-06 NOTE — Assessment & Plan Note (Signed)
Pt will have R colectomy scheduled for 1/23

## 2015-12-06 NOTE — Assessment & Plan Note (Signed)

## 2015-12-06 NOTE — Assessment & Plan Note (Signed)
Labs

## 2015-12-06 NOTE — Patient Instructions (Signed)

## 2015-12-06 NOTE — Progress Notes (Signed)
Pre visit review using our clinic review tool, if applicable. No additional management support is needed unless otherwise documented below in the visit note. 

## 2015-12-06 NOTE — Assessment & Plan Note (Signed)
1/17 upper back - small dark and irreg 1-2 mm x3

## 2015-12-06 NOTE — Progress Notes (Signed)
Subjective:  Patient ID: Sergio Hughes, male    DOB: [birth year]  Age: 75 y.o. MRN: [id number]  CC: No chief complaint on file.   HPI RONEN KAMMER presents for a well exam. F/u HTN, OA. Pt will have R colectomy scheduled for 1/23  Outpatient Prescriptions Prior to Visit  Medication Sig Dispense Refill  . aspirin 81 MG EC tablet Take 81 mg by mouth daily.      . Cholecalciferol (VITAMIN D) 2000 UNITS CAPS Take 1 capsule by mouth daily.      . Glucosamine-Chondroit-Vit C-Mn (GLUCOSAMINE 1500 COMPLEX) CAPS Take by mouth.    Marland Kitchen ibuprofen (ADVIL,MOTRIN) 600 MG tablet Take 1 tablet (600 mg total) by mouth 2 (two) times daily as needed. 180 tablet 1  . lisinopril (PRINIVIL,ZESTRIL) 20 MG tablet Take 0.5 tablets (10 mg total) by mouth daily. 90 tablet 3  . Omega-3 Fatty Acids (FISH OIL) 1000 MG CAPS Take by mouth daily.      . tadalafil (CIALIS) 20 MG tablet Take 20 mg by mouth every other day as needed.       No facility-administered medications prior to visit.    ROS Review of Systems  Constitutional: Negative for appetite change, fatigue and unexpected weight change.  HENT: Negative for congestion, nosebleeds, sneezing, sore throat and trouble swallowing.   Eyes: Negative for itching and visual disturbance.  Respiratory: Negative for cough.   Cardiovascular: Negative for chest pain, palpitations and leg swelling.  Gastrointestinal: Negative for nausea, diarrhea, blood in stool and abdominal distention.  Genitourinary: Negative for frequency and hematuria.  Musculoskeletal: Positive for arthralgias. Negative for back pain, joint swelling, gait problem and neck pain.  Skin: Negative for rash.  Neurological: Negative for dizziness, tremors, speech difficulty and weakness.  Psychiatric/Behavioral: Negative for suicidal ideas, sleep disturbance, dysphoric mood and agitation. The patient is not nervous/anxious.     Objective:  BP 132/70 mmHg  Pulse 69  Ht 5' 4.5" (1.638 m)  Wt 147 lb  (66.679 kg)  BMI 24.85 kg/m2  SpO2 98%  BP Readings from Last 3 Encounters:  12/06/15 132/70  05/22/15 128/82  04/15/15 127/70    Wt Readings from Last 3 Encounters:  12/06/15 147 lb (66.679 kg)  05/22/15 142 lb (64.411 kg)  04/15/15 148 lb (67.132 kg)    Physical Exam  Constitutional: He is oriented to person, place, and time. He appears well-developed and well-nourished. No distress.  HENT:  Head: Normocephalic and atraumatic.  Right Ear: External ear normal.  Left Ear: External ear normal.  Nose: Nose normal.  Mouth/Throat: Oropharynx is clear and moist. No oropharyngeal exudate.  Eyes: Conjunctivae and EOM are normal. Pupils are equal, round, and reactive to light. Right eye exhibits no discharge. Left eye exhibits no discharge. No scleral icterus.  Neck: Normal range of motion. Neck supple. No JVD present. No tracheal deviation present. No thyromegaly present.  Cardiovascular: Normal rate, regular rhythm, normal heart sounds and intact distal pulses.  Exam reveals no gallop and no friction rub.   No murmur heard. Pulmonary/Chest: Effort normal and breath sounds normal. No stridor. No respiratory distress. He has no wheezes. He has no rales. He exhibits no tenderness.  Abdominal: Soft. Bowel sounds are normal. He exhibits no distension and no mass. There is no tenderness. There is no rebound and no guarding.  Genitourinary: No penile tenderness.  Musculoskeletal: Normal range of motion. He exhibits no edema or tenderness.  Lymphadenopathy:    He has no cervical adenopathy.  Neurological: He is alert and oriented to person, place, and time. He has normal reflexes. No cranial nerve deficit. He exhibits normal muscle tone. Coordination normal.  Skin: Skin is warm and dry. No rash noted. He is not diaphoretic. No erythema. No pallor.  Psychiatric: He has a normal mood and affect. His behavior is normal. Judgment and thought content normal.  rectal per Dr Ardis Hughs  Lab Results    Component Value Date   WBC 8.2 11/20/2014   HGB 14.6 11/20/2014   HCT 44.6 11/20/2014   PLT 215.0 11/20/2014   GLUCOSE 96 05/22/2015   CHOL 261* 11/20/2014   TRIG 77.0 11/20/2014   HDL 88.70 11/20/2014   LDLDIRECT 144.9 11/14/2013   LDLCALC 157* 11/20/2014   ALT 22 11/20/2014   AST 31 11/20/2014   NA 140 05/22/2015   K 4.9 05/22/2015   CL 105 05/22/2015   CREATININE 1.62* 05/22/2015   BUN 30* 05/22/2015   CO2 26 05/22/2015   TSH 0.75 11/20/2014   PSA 3.88 11/20/2014    Dg Chest 2 View  01/14/2011  *RADIOLOGY REPORT* Clinical Data: Cough, hypertension CHEST - 2 VIEW Comparison: None. Findings: There is a dextroscoliosis of the upper thoracic spine. Lungs clear.  No effusion.  Heart size normal. IMPRESSION: 1.  No acute cardiopulmonary disease. 2.  Thoracic dextroscoliosis. Original Report Authenticated By: Trecia Rogers, M.D.   Assessment & Plan:   There are no diagnoses linked to this encounter. I am having Mr. Vonbank maintain his aspirin, tadalafil, Fish Oil, Vitamin D, GLUCOSAMINE 1500 COMPLEX, ibuprofen, and lisinopril.  No orders of the defined types were placed in this encounter.     Follow-up: No Follow-up on file.  Walker Kehr, MD

## 2015-12-09 ENCOUNTER — Other Ambulatory Visit: Payer: Self-pay

## 2015-12-09 ENCOUNTER — Other Ambulatory Visit: Payer: Self-pay | Admitting: General Surgery

## 2015-12-09 ENCOUNTER — Encounter (HOSPITAL_COMMUNITY): Payer: Self-pay

## 2015-12-09 ENCOUNTER — Other Ambulatory Visit: Payer: Self-pay | Admitting: Internal Medicine

## 2015-12-09 ENCOUNTER — Encounter (HOSPITAL_COMMUNITY)
Admission: RE | Admit: 2015-12-09 | Discharge: 2015-12-09 | Disposition: A | Discharge status: Home / Self Care | Payer: Commercial Managed Care - HMO | Source: Ambulatory Visit | Attending: General Surgery | Admitting: General Surgery

## 2015-12-09 DIAGNOSIS — Z01812 Encounter for preprocedural laboratory examination: Secondary | ICD-10-CM | POA: Diagnosis not present

## 2015-12-09 DIAGNOSIS — R9431 Abnormal electrocardiogram [ECG] [EKG]: Secondary | ICD-10-CM | POA: Diagnosis not present

## 2015-12-09 DIAGNOSIS — Z0183 Encounter for blood typing: Secondary | ICD-10-CM | POA: Insufficient documentation

## 2015-12-09 DIAGNOSIS — R001 Bradycardia, unspecified: Secondary | ICD-10-CM | POA: Diagnosis not present

## 2015-12-09 DIAGNOSIS — I4510 Unspecified right bundle-branch block: Secondary | ICD-10-CM | POA: Diagnosis not present

## 2015-12-09 DIAGNOSIS — D120 Benign neoplasm of cecum: Secondary | ICD-10-CM | POA: Diagnosis not present

## 2015-12-09 DIAGNOSIS — Z01818 Encounter for other preprocedural examination: Secondary | ICD-10-CM | POA: Diagnosis not present

## 2015-12-09 DIAGNOSIS — R972 Elevated prostate specific antigen [PSA]: Secondary | ICD-10-CM

## 2015-12-09 DIAGNOSIS — D369 Benign neoplasm, unspecified site: Secondary | ICD-10-CM | POA: Diagnosis not present

## 2015-12-09 LAB — CBC WITH DIFFERENTIAL/PLATELET
BASOS ABS: 0 10*3/uL (ref 0.0–0.1)
Basophils Relative: 0 %
EOS ABS: 0.2 10*3/uL (ref 0.0–0.7)
EOS PCT: 2 %
HCT: 41.9 % (ref 39.0–52.0)
HEMOGLOBIN: 14 g/dL (ref 13.0–17.0)
LYMPHS ABS: 1.6 10*3/uL (ref 0.7–4.0)
LYMPHS PCT: 22 %
MCH: 31 pg (ref 26.0–34.0)
MCHC: 33.4 g/dL (ref 30.0–36.0)
MCV: 92.7 fL (ref 78.0–100.0)
Monocytes Absolute: 0.9 10*3/uL (ref 0.1–1.0)
Monocytes Relative: 12 %
NEUTROS PCT: 64 %
Neutro Abs: 4.8 10*3/uL (ref 1.7–7.7)
PLATELETS: 208 10*3/uL (ref 150–400)
RBC: 4.52 MIL/uL (ref 4.22–5.81)
RDW: 12.7 % (ref 11.5–15.5)
WBC: 7.5 10*3/uL (ref 4.0–10.5)

## 2015-12-09 LAB — COMPREHENSIVE METABOLIC PANEL
ALBUMIN: 3.7 g/dL (ref 3.5–5.0)
ALT: 27 U/L (ref 17–63)
ANION GAP: 8 (ref 5–15)
AST: 29 U/L (ref 15–41)
Alkaline Phosphatase: 67 U/L (ref 38–126)
BILIRUBIN TOTAL: 0.4 mg/dL (ref 0.3–1.2)
BUN: 23 mg/dL — AB (ref 6–20)
CHLORIDE: 109 mmol/L (ref 101–111)
CO2: 24 mmol/L (ref 22–32)
Calcium: 9.2 mg/dL (ref 8.9–10.3)
Creatinine, Ser: 1.42 mg/dL — ABNORMAL HIGH (ref 0.61–1.24)
GFR calc Af Amer: 55 mL/min — ABNORMAL LOW (ref >60)
GFR calc non Af Amer: 47 mL/min — ABNORMAL LOW (ref >60)
GLUCOSE: 104 mg/dL — AB (ref 65–99)
POTASSIUM: 4.6 mmol/L (ref 3.5–5.1)
SODIUM: 141 mmol/L (ref 135–145)
TOTAL PROTEIN: 7.1 g/dL (ref 6.5–8.1)

## 2015-12-09 LAB — PROTIME-INR
INR: 1.02 (ref 0.00–1.49)
Prothrombin Time: 13.6 seconds (ref 11.6–15.2)

## 2015-12-09 LAB — TYPE AND SCREEN
ABO/RH(D): A POS
ANTIBODY SCREEN: NEGATIVE

## 2015-12-09 LAB — ABO/RH: ABO/RH(D): A POS

## 2015-12-09 MED ORDER — VASCEPA 1 G PO CAPS: 2.0000 | ORAL_CAPSULE | Freq: Two times a day (BID) | ORAL | Status: DC

## 2015-12-09 NOTE — Pre-Procedure Instructions (Signed)
Jermarius Kosa Tiegs  12/09/2015      [facility] - [place], [place] - [address] [place] [address] Phone: [phone] Fax: [phone]  [id number] - [place], [place] - [address] AT [address] [place] [address] Phone: [phone] Fax: [phone]    Your procedure is scheduled on Jan. 23  Report to [place] at 530 A.M.  Call this number if you have problems the morning of surgery:  [phone]   Remember:  Do not eat food or drink liquids after midnight.  Take these medicines the morning of surgery with A SIP OF WATER na  Stop taking aspirin, Ibuprofen, Aleve, BC's, Goody's, Herbal medications, Fish oil,vitamins,Glucosamine   Do not wear jewelry, make-up or nail polish.  Do not wear lotions, powders, or perfumes.  You may wear deodorant.  Do not shave 48 hours prior to surgery.  Men may shave face and neck.  Do not bring valuables to the hospital.  [facility] is not responsible for any belongings or valuables.  Contacts, dentures or bridgework may not be worn into surgery.  Leave your suitcase in the car.  After surgery it may be brought to your room.  For patients admitted to the hospital, discharge time will be determined by your treatment team.  Patients discharged the day of surgery will not be allowed to drive home.    Special instructions:  [facility] - Preparing for Surgery  Before surgery, you can play an important role.  Because skin is not sterile, your skin needs to be as free of germs as possible.  You can reduce the number of germs on you skin by washing with CHG (chlorahexidine gluconate) soap before surgery.  CHG is an antiseptic cleaner which kills germs and bonds with the skin to continue killing germs even after washing.  Please DO NOT use if you have an allergy to CHG or antibacterial soaps.  If your skin becomes  reddened/irritated stop using the CHG and inform your nurse when you arrive at Short Stay.  Do not shave (including legs and underarms) for at least 48 hours prior to the first CHG shower.  You may shave your face.  Please follow these instructions carefully:   1.  Shower with CHG Soap the night before surgery and the    morning of Surgery.  2.  If you choose to wash your hair, wash your hair first as usual with your  normal shampoo.  3.  After you shampoo, rinse your hair and body thoroughly to remove the   Shampoo.  4.  Use CHG as you would any other liquid soap.  You can apply chg directly   to the skin and wash gently with scrungie or a clean washcloth.  5.  Apply the CHG Soap to your body ONLY FROM THE NECK DOWN.   Do not use on open wounds or open sores.  Avoid contact with your eyes, ears, mouth and genitals (private parts).  Wash genitals (private parts)     with your normal soap.  6.  Wash thoroughly, paying special attention to the area where your surgery     will be performed.  7.  Thoroughly rinse your body with warm water from the neck down.  8.  DO NOT shower/wash with your normal soap after using and rinsing off   the  CHG Soap.  9.  Pat yourself dry with a clean towel.            10.  Wear clean pajamas.            11.  Place clean sheets on your bed the night of your first shower and do not  sleep with pets.  Day of Surgery  Do not apply any lotions/deoderants the morning of surgery.  Please wear clean clothes to the hospital/surgery center.    Please read over the following fact sheets that you were given. Pain Booklet, Coughing and Deep Breathing, Blood Transfusion Information and Surgical Site Infection Prevention

## 2015-12-09 NOTE — Progress Notes (Signed)
PCP is Dr Alain Marion Denies ever seeing a cardiologist. Denies ever having a card cath or echo States he had a stress test 8-10 years ago, but everything was fine.

## 2015-12-10 LAB — HEMOGLOBIN A1C
HEMOGLOBIN A1C: 5.8 % — AB (ref 4.8–5.6)
MEAN PLASMA GLUCOSE: 120 mg/dL

## 2015-12-15 MED ORDER — DEXTROSE 5 % IV SOLN
2.0000 g | INTRAVENOUS | Status: DC
  Filled 2015-12-15: qty 2

## 2015-12-16 ENCOUNTER — Encounter (HOSPITAL_COMMUNITY): Payer: Self-pay | Admitting: *Deleted

## 2015-12-16 ENCOUNTER — Encounter (HOSPITAL_COMMUNITY)
Admission: RE | Disposition: A | Discharge status: Home / Self Care | Payer: Self-pay | Source: Ambulatory Visit | Attending: General Surgery

## 2015-12-16 ENCOUNTER — Inpatient Hospital Stay (HOSPITAL_COMMUNITY)
Admission: RE | Admit: 2015-12-16 | Discharge: 2015-12-18 | DRG: 331 | Disposition: A | Discharge status: Home / Self Care | Payer: Commercial Managed Care - HMO | Source: Ambulatory Visit | Attending: General Surgery | Admitting: General Surgery

## 2015-12-16 ENCOUNTER — Inpatient Hospital Stay (HOSPITAL_COMMUNITY): Payer: Commercial Managed Care - HMO | Admitting: Anesthesiology

## 2015-12-16 DIAGNOSIS — D374 Neoplasm of uncertain behavior of colon: Secondary | ICD-10-CM | POA: Diagnosis present

## 2015-12-16 DIAGNOSIS — I4510 Unspecified right bundle-branch block: Secondary | ICD-10-CM | POA: Diagnosis present

## 2015-12-16 DIAGNOSIS — Z888 Allergy status to other drugs, medicaments and biological substances status: Secondary | ICD-10-CM

## 2015-12-16 DIAGNOSIS — Z7982 Long term (current) use of aspirin: Secondary | ICD-10-CM | POA: Diagnosis not present

## 2015-12-16 DIAGNOSIS — Z87891 Personal history of nicotine dependence: Secondary | ICD-10-CM

## 2015-12-16 DIAGNOSIS — D122 Benign neoplasm of ascending colon: Secondary | ICD-10-CM | POA: Diagnosis present

## 2015-12-16 DIAGNOSIS — D120 Benign neoplasm of cecum: Secondary | ICD-10-CM | POA: Diagnosis not present

## 2015-12-16 DIAGNOSIS — I10 Essential (primary) hypertension: Secondary | ICD-10-CM | POA: Diagnosis not present

## 2015-12-16 DIAGNOSIS — D121 Benign neoplasm of appendix: Secondary | ICD-10-CM | POA: Diagnosis not present

## 2015-12-16 DIAGNOSIS — D126 Benign neoplasm of colon, unspecified: Secondary | ICD-10-CM | POA: Diagnosis not present

## 2015-12-16 DIAGNOSIS — D372 Neoplasm of uncertain behavior of small intestine: Secondary | ICD-10-CM | POA: Diagnosis not present

## 2015-12-16 HISTORY — PX: PARTIAL COLECTOMY: SHX5273

## 2015-12-16 SURGERY — COLECTOMY, PARTIAL
Anesthesia: General | Site: Abdomen | Laterality: Right

## 2015-12-16 MED ORDER — LIDOCAINE HCL (CARDIAC) 20 MG/ML IV SOLN
INTRAVENOUS | Status: AC
  Filled 2015-12-16: qty 5

## 2015-12-16 MED ORDER — HYDROMORPHONE HCL 1 MG/ML IJ SOLN
1.0000 mg | INTRAMUSCULAR | Status: DC | PRN
  Administered 2015-12-16: 1 mg via INTRAVENOUS
  Filled 2015-12-16: qty 1

## 2015-12-16 MED ORDER — LISINOPRIL 10 MG PO TABS
10.0000 mg | ORAL_TABLET | Freq: Every day | ORAL | Status: DC
  Administered 2015-12-17 – 2015-12-18 (×2): 10 mg via ORAL
  Filled 2015-12-16 (×2): qty 1

## 2015-12-16 MED ORDER — LACTATED RINGERS IV SOLN
INTRAVENOUS | Status: DC | PRN
  Administered 2015-12-16 (×2): via INTRAVENOUS

## 2015-12-16 MED ORDER — SUCCINYLCHOLINE CHLORIDE 20 MG/ML IJ SOLN
INTRAMUSCULAR | Status: AC
  Filled 2015-12-16: qty 1

## 2015-12-16 MED ORDER — NEOSTIGMINE METHYLSULFATE 10 MG/10ML IV SOLN
INTRAVENOUS | Status: AC
  Filled 2015-12-16: qty 1

## 2015-12-16 MED ORDER — PROMETHAZINE HCL 25 MG/ML IJ SOLN: 6.2500 mg | INTRAMUSCULAR | Status: DC | PRN

## 2015-12-16 MED ORDER — HYDROMORPHONE HCL 1 MG/ML IJ SOLN
INTRAMUSCULAR | Status: AC
  Filled 2015-12-16: qty 1

## 2015-12-16 MED ORDER — ONDANSETRON HCL 4 MG/2ML IJ SOLN
INTRAMUSCULAR | Status: AC
  Filled 2015-12-16: qty 2

## 2015-12-16 MED ORDER — ONDANSETRON HCL 4 MG PO TABS: 4.0000 mg | ORAL_TABLET | Freq: Four times a day (QID) | ORAL | Status: DC | PRN

## 2015-12-16 MED ORDER — 0.9 % SODIUM CHLORIDE (POUR BTL) OPTIME
TOPICAL | Status: DC | PRN
  Administered 2015-12-16 (×2): 1000 mL

## 2015-12-16 MED ORDER — POVIDONE-IODINE 10 % EX OINT: TOPICAL_OINTMENT | CUTANEOUS | Status: DC | PRN

## 2015-12-16 MED ORDER — FENTANYL CITRATE (PF) 250 MCG/5ML IJ SOLN
INTRAMUSCULAR | Status: AC
  Filled 2015-12-16: qty 5

## 2015-12-16 MED ORDER — BUPIVACAINE-EPINEPHRINE 0.5% -1:200000 IJ SOLN
INTRAMUSCULAR | Status: DC | PRN
  Administered 2015-12-16: 17 mL

## 2015-12-16 MED ORDER — VITAMIN D 1000 UNITS PO TABS
2000.0000 [IU] | ORAL_TABLET | Freq: Every day | ORAL | Status: DC
  Administered 2015-12-17 – 2015-12-18 (×2): 2000 [IU] via ORAL
  Filled 2015-12-16 (×2): qty 2

## 2015-12-16 MED ORDER — LIDOCAINE HCL (CARDIAC) 20 MG/ML IV SOLN
INTRAVENOUS | Status: DC | PRN
  Administered 2015-12-16: 80 mL via INTRATRACHEAL

## 2015-12-16 MED ORDER — SODIUM CHLORIDE 0.9 % IJ SOLN
INTRAMUSCULAR | Status: AC
  Filled 2015-12-16: qty 10

## 2015-12-16 MED ORDER — EPHEDRINE SULFATE 50 MG/ML IJ SOLN
INTRAMUSCULAR | Status: AC
  Filled 2015-12-16: qty 1

## 2015-12-16 MED ORDER — HYDROMORPHONE HCL 1 MG/ML IJ SOLN
0.2500 mg | INTRAMUSCULAR | Status: DC | PRN
  Administered 2015-12-16 (×3): 0.5 mg via INTRAVENOUS

## 2015-12-16 MED ORDER — PHENYLEPHRINE 40 MCG/ML (10ML) SYRINGE FOR IV PUSH (FOR BLOOD PRESSURE SUPPORT)
PREFILLED_SYRINGE | INTRAVENOUS | Status: AC
  Filled 2015-12-16: qty 10

## 2015-12-16 MED ORDER — HYDROMORPHONE HCL 1 MG/ML IJ SOLN
INTRAMUSCULAR | Status: DC | PRN
  Administered 2015-12-16: 0.5 mg via INTRAVENOUS

## 2015-12-16 MED ORDER — GLYCOPYRROLATE 0.2 MG/ML IJ SOLN
INTRAMUSCULAR | Status: AC
  Filled 2015-12-16: qty 1

## 2015-12-16 MED ORDER — ENOXAPARIN SODIUM 40 MG/0.4ML ~~LOC~~ SOLN
40.0000 mg | SUBCUTANEOUS | Status: DC
  Administered 2015-12-17 – 2015-12-18 (×2): 40 mg via SUBCUTANEOUS
  Filled 2015-12-16 (×2): qty 0.4

## 2015-12-16 MED ORDER — ROCURONIUM BROMIDE 100 MG/10ML IV SOLN
INTRAVENOUS | Status: DC | PRN
  Administered 2015-12-16: 50 mg via INTRAVENOUS
  Administered 2015-12-16: 10 mg via INTRAVENOUS

## 2015-12-16 MED ORDER — VITAMIN D 50 MCG (2000 UT) PO CAPS: 1.0000 | ORAL_CAPSULE | Freq: Every day | ORAL | Status: DC

## 2015-12-16 MED ORDER — BUPIVACAINE-EPINEPHRINE (PF) 0.5% -1:200000 IJ SOLN
INTRAMUSCULAR | Status: AC
  Filled 2015-12-16: qty 30

## 2015-12-16 MED ORDER — PROPOFOL 10 MG/ML IV BOLUS
INTRAVENOUS | Status: AC
  Filled 2015-12-16: qty 20

## 2015-12-16 MED ORDER — KCL IN DEXTROSE-NACL 20-5-0.45 MEQ/L-%-% IV SOLN
INTRAVENOUS | Status: DC
  Administered 2015-12-16 – 2015-12-18 (×4): via INTRAVENOUS
  Filled 2015-12-16 (×4): qty 1000

## 2015-12-16 MED ORDER — ONDANSETRON HCL 4 MG/2ML IJ SOLN: 4.0000 mg | Freq: Four times a day (QID) | INTRAMUSCULAR | Status: DC | PRN

## 2015-12-16 MED ORDER — NEOSTIGMINE METHYLSULFATE 10 MG/10ML IV SOLN
INTRAVENOUS | Status: DC | PRN
  Administered 2015-12-16: 3 mg via INTRAVENOUS

## 2015-12-16 MED ORDER — OXYCODONE-ACETAMINOPHEN 5-325 MG PO TABS
1.0000 | ORAL_TABLET | ORAL | Status: DC | PRN
  Administered 2015-12-16 – 2015-12-17 (×3): 2 via ORAL
  Filled 2015-12-16 (×3): qty 2

## 2015-12-16 MED ORDER — DEXTROSE 5 % IV SOLN
2.0000 g | INTRAVENOUS | Status: DC | PRN
  Administered 2015-12-16: 2 g via INTRAVENOUS

## 2015-12-16 MED ORDER — ROCURONIUM BROMIDE 50 MG/5ML IV SOLN
INTRAVENOUS | Status: AC
  Filled 2015-12-16: qty 1

## 2015-12-16 MED ORDER — DEXTROSE 5 % IV SOLN
2.0000 g | Freq: Two times a day (BID) | INTRAVENOUS | Status: AC
  Administered 2015-12-16: 2 g via INTRAVENOUS
  Filled 2015-12-16: qty 2

## 2015-12-16 MED ORDER — ALVIMOPAN 12 MG PO CAPS
12.0000 mg | ORAL_CAPSULE | Freq: Two times a day (BID) | ORAL | Status: DC
  Administered 2015-12-17 – 2015-12-18 (×3): 12 mg via ORAL
  Filled 2015-12-16 (×3): qty 1

## 2015-12-16 MED ORDER — CHLORHEXIDINE GLUCONATE CLOTH 2 % EX PADS: 6.0000 | MEDICATED_PAD | Freq: Once | CUTANEOUS | Status: DC

## 2015-12-16 MED ORDER — EPHEDRINE SULFATE 50 MG/ML IJ SOLN
INTRAMUSCULAR | Status: DC | PRN
  Administered 2015-12-16 (×2): 10 mg via INTRAVENOUS

## 2015-12-16 MED ORDER — POVIDONE-IODINE 10 % EX OINT
TOPICAL_OINTMENT | CUTANEOUS | Status: AC
  Filled 2015-12-16: qty 28.35

## 2015-12-16 MED ORDER — FENTANYL CITRATE (PF) 100 MCG/2ML IJ SOLN
INTRAMUSCULAR | Status: DC | PRN
  Administered 2015-12-16 (×3): 50 ug via INTRAVENOUS
  Administered 2015-12-16: 100 ug via INTRAVENOUS

## 2015-12-16 MED ORDER — GLYCOPYRROLATE 0.2 MG/ML IJ SOLN
INTRAMUSCULAR | Status: DC | PRN
  Administered 2015-12-16: 0.4 mg via INTRAVENOUS

## 2015-12-16 MED ORDER — ALVIMOPAN 12 MG PO CAPS
12.0000 mg | ORAL_CAPSULE | Freq: Once | ORAL | Status: AC
  Administered 2015-12-16: 12 mg via ORAL

## 2015-12-16 MED ORDER — PROPOFOL 10 MG/ML IV BOLUS
INTRAVENOUS | Status: DC | PRN
  Administered 2015-12-16: 120 mg via INTRAVENOUS

## 2015-12-16 MED ORDER — ALVIMOPAN 12 MG PO CAPS
ORAL_CAPSULE | ORAL | Status: AC
  Filled 2015-12-16: qty 1

## 2015-12-16 MED ORDER — ICOSAPENT ETHYL 1 G PO CAPS: 2.0000 | ORAL_CAPSULE | Freq: Two times a day (BID) | ORAL | Status: DC

## 2015-12-16 MED ORDER — ONDANSETRON HCL 4 MG/2ML IJ SOLN
INTRAMUSCULAR | Status: DC | PRN
  Administered 2015-12-16: 4 mg via INTRAVENOUS

## 2015-12-16 SURGICAL SUPPLY — 67 items
BENZOIN TINCTURE PRP APPL 2/3 (GAUZE/BANDAGES/DRESSINGS) IMPLANT
BLADE SURG ROTATE 9660 (MISCELLANEOUS) ×3 IMPLANT
CANISTER SUCTION 2500CC (MISCELLANEOUS) ×3 IMPLANT
CHLORAPREP W/TINT 26ML (MISCELLANEOUS) ×3 IMPLANT
CLOSURE WOUND 1/2 X4 (GAUZE/BANDAGES/DRESSINGS) ×1
COVER MAYO STAND STRL (DRAPES) ×3 IMPLANT
COVER SURGICAL LIGHT HANDLE (MISCELLANEOUS) ×6 IMPLANT
DECANTER SPIKE VIAL GLASS SM (MISCELLANEOUS) ×3 IMPLANT
DERMABOND ADVANCED (GAUZE/BANDAGES/DRESSINGS) ×2
DERMABOND ADVANCED .7 DNX12 (GAUZE/BANDAGES/DRESSINGS) ×1 IMPLANT
DRAPE LAPAROSCOPIC ABDOMINAL (DRAPES) ×3 IMPLANT
DRAPE PROXIMA HALF (DRAPES) IMPLANT
DRAPE UTILITY XL STRL (DRAPES) ×3 IMPLANT
DRAPE WARM FLUID 44X44 (DRAPE) ×3 IMPLANT
DRSG OPSITE POSTOP 4X10 (GAUZE/BANDAGES/DRESSINGS) IMPLANT
DRSG OPSITE POSTOP 4X8 (GAUZE/BANDAGES/DRESSINGS) IMPLANT
DRSG TEGADERM 4X4.75 (GAUZE/BANDAGES/DRESSINGS) ×6 IMPLANT
ELECT BLADE 6.5 EXT (BLADE) IMPLANT
ELECT CAUTERY BLADE 6.4 (BLADE) ×6 IMPLANT
ELECT REM PT RETURN 9FT ADLT (ELECTROSURGICAL) ×3
ELECTRODE REM PT RTRN 9FT ADLT (ELECTROSURGICAL) ×1 IMPLANT
GLOVE BIO SURGEON STRL SZ 6 (GLOVE) ×6 IMPLANT
GLOVE BIO SURGEON STRL SZ7 (GLOVE) ×9 IMPLANT
GLOVE BIOGEL PI IND STRL 7.0 (GLOVE) ×2 IMPLANT
GLOVE BIOGEL PI IND STRL 8 (GLOVE) ×2 IMPLANT
GLOVE BIOGEL PI INDICATOR 7.0 (GLOVE) ×4
GLOVE BIOGEL PI INDICATOR 8 (GLOVE) ×4
GLOVE ECLIPSE 7.5 STRL STRAW (GLOVE) ×15 IMPLANT
GOWN STRL REUS W/ TWL LRG LVL3 (GOWN DISPOSABLE) ×5 IMPLANT
GOWN STRL REUS W/TWL LRG LVL3 (GOWN DISPOSABLE) ×10
KIT BASIN OR (CUSTOM PROCEDURE TRAY) ×3 IMPLANT
KIT ROOM TURNOVER OR (KITS) ×3 IMPLANT
LEGGING LITHOTOMY PAIR STRL (DRAPES) IMPLANT
LIGASURE IMPACT 36 18CM CVD LR (INSTRUMENTS) ×3 IMPLANT
NEEDLE HYPO 25GX1X1/2 BEV (NEEDLE) ×3 IMPLANT
NS IRRIG 1000ML POUR BTL (IV SOLUTION) ×6 IMPLANT
PACK GENERAL/GYN (CUSTOM PROCEDURE TRAY) ×3 IMPLANT
PAD ARMBOARD 7.5X6 YLW CONV (MISCELLANEOUS) ×3 IMPLANT
PENCIL BUTTON HOLSTER BLD 10FT (ELECTRODE) ×3 IMPLANT
RELOAD PROXIMATE 75MM BLUE (ENDOMECHANICALS) ×6 IMPLANT
SPECIMEN JAR MEDIUM (MISCELLANEOUS) ×3 IMPLANT
SPECIMEN JAR X LARGE (MISCELLANEOUS) IMPLANT
SPONGE LAP 18X18 X RAY DECT (DISPOSABLE) IMPLANT
STAPLER PROXIMATE 75MM BLUE (STAPLE) ×3 IMPLANT
STAPLER VISISTAT 35W (STAPLE) ×3 IMPLANT
STRIP CLOSURE SKIN 1/2X4 (GAUZE/BANDAGES/DRESSINGS) ×2 IMPLANT
SUCTION POOLE TIP (SUCTIONS) ×3 IMPLANT
SURGILUBE 2OZ TUBE FLIPTOP (MISCELLANEOUS) IMPLANT
SUT MNCRL AB 3-0 PS2 18 (SUTURE) ×3 IMPLANT
SUT NOVA NAB DX-16 0-1 5-0 T12 (SUTURE) ×6 IMPLANT
SUT PDS AB 1 TP1 96 (SUTURE) ×6 IMPLANT
SUT PROLENE 2 0 CT2 30 (SUTURE) IMPLANT
SUT PROLENE 2 0 KS (SUTURE) IMPLANT
SUT SILK 2 0 SH CR/8 (SUTURE) ×3 IMPLANT
SUT SILK 2 0 TIES 10X30 (SUTURE) ×3 IMPLANT
SUT SILK 3 0 SH CR/8 (SUTURE) ×3 IMPLANT
SUT SILK 3 0 TIES 10X30 (SUTURE) ×3 IMPLANT
SYR BULB IRRIGATION 50ML (SYRINGE) ×3 IMPLANT
SYR CONTROL 10ML LL (SYRINGE) ×3 IMPLANT
TOWEL OR 17X26 10 PK STRL BLUE (TOWEL DISPOSABLE) ×3 IMPLANT
TRAY FOLEY CATH 14FRSI W/METER (CATHETERS) ×3 IMPLANT
TRAY PROCTOSCOPIC FIBER OPTIC (SET/KITS/TRAYS/PACK) IMPLANT
TUBE CONNECTING 12'X1/4 (SUCTIONS) ×1
TUBE CONNECTING 12X1/4 (SUCTIONS) ×2 IMPLANT
UNDERPAD 30X30 INCONTINENT (UNDERPADS AND DIAPERS) IMPLANT
WATER STERILE IRR 1000ML POUR (IV SOLUTION) IMPLANT
YANKAUER SUCT BULB TIP NO VENT (SUCTIONS) ×3 IMPLANT

## 2015-12-16 NOTE — Anesthesia Postprocedure Evaluation (Addendum)
Anesthesia Post Note  Patient: Sergio Hughes  Procedure(s) Performed: Procedure(s) (LRB): PARTIAL CECECTOMY AND APPENDECTOMY (Right)  Patient location during evaluation: PACU Anesthesia Type: General Level of consciousness: oriented and awake and alert Pain management: pain level controlled Vital Signs Assessment: post-procedure vital signs reviewed and stable Respiratory status: spontaneous breathing and respiratory function stable Cardiovascular status: blood pressure returned to baseline and stable Postop Assessment: no headache and no backache Anesthetic complications: no    Last Vitals:  Filed Vitals:   12/16/15 0924 12/16/15 0925  BP: 143/72   Pulse:  76  Temp: 35.6 C   Resp:  7    Last Pain: There were no vitals filed for this visit.               Zenaida Deed

## 2015-12-16 NOTE — Anesthesia Preprocedure Evaluation (Addendum)
Anesthesia Evaluation  Patient identified by MRN, date of birth, ID band Patient awake    Reviewed: Allergy & Precautions, H&P , NPO status , Patient's Chart, lab work & pertinent test results  History of Anesthesia Complications Negative for: history of anesthetic complications  Airway Mallampati: II  TM Distance: >3 FB Neck ROM: full    Dental no notable dental hx.    Pulmonary former smoker,    Pulmonary exam normal breath sounds clear to auscultation       Cardiovascular hypertension, Pt. on medications Normal cardiovascular exam Rhythm:regular Rate:Normal     Neuro/Psych negative neurological ROS     GI/Hepatic negative GI ROS, Neg liver ROS,   Endo/Other  negative endocrine ROS  Renal/GU negative Renal ROS     Musculoskeletal  (+) Arthritis ,   Abdominal   Peds  Hematology negative hematology ROS (+)   Anesthesia Other Findings EKG reviewed with RBBB  Reproductive/Obstetrics negative OB ROS                            Anesthesia Physical Anesthesia Plan  ASA: II  Anesthesia Plan: General   Post-op Pain Management:    Induction: Intravenous  Airway Management Planned: Oral ETT  Additional Equipment:   Intra-op Plan:   Post-operative Plan: Extubation in OR  Informed Consent: I have reviewed the patients History and Physical, chart, labs and discussed the procedure including the risks, benefits and alternatives for the proposed anesthesia with the patient or authorized representative who has indicated his/her understanding and acceptance.   Dental Advisory Given  Plan Discussed with: Anesthesiologist and CRNA  Anesthesia Plan Comments:         Anesthesia Quick Evaluation

## 2015-12-16 NOTE — Transfer of Care (Signed)
Immediate Anesthesia Transfer of Care Note  Patient: Sergio Hughes  Procedure(s) Performed: Procedure(s): PARTIAL CECECTOMY AND APPENDECTOMY (Right)  Patient Location: PACU  Anesthesia Type:General  Level of Consciousness: awake, oriented and patient cooperative  Airway & Oxygen Therapy: Patient Spontanous Breathing and Patient connected to nasal cannula oxygen  Post-op Assessment: Report given to RN, Post -op Vital signs reviewed and stable and Patient moving all extremities X 4  Post vital signs: Reviewed and stable  Last Vitals:  Filed Vitals:   12/16/15 0609  BP: 115/62  Pulse: 66  Temp: 36 C  Resp: 20    Complications: No apparent anesthesia complications

## 2015-12-16 NOTE — Op Note (Signed)
OPERATIVE REPORT  DATE OF OPERATION: 12/16/2015  PATIENT:  [name]  75 y.o. 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 After an adequate general endotracheal anesthetic was administered he was prepped and draped in usual sterile manner exposing the abdomen.  A proper timeout was performed identifying the patient and the procedure to be performed. A 10 cm right transverse incision was made at the level of the umbilicus and taken down through the subcutaneous tissue, through the anterior rectus sheath, through the rectus muscle itself, then the posterior rectus sheath into the peritoneal cavity.  We initially started off with Richardson retractors as we mobilized the appendix and the cecum into the field. The distal terminal ileum was tethered down towards the pelvis and with proper retraction we were able to do take down the adhering peritoneal attachments and mobilized the terminal ileum into the wound.  We palpated the patient's liver and there was no evidence of palpable tumor. We could palpate at the appendiceal orifice and there was felt to be the recurrent tubulovillous adenoma. It appeared to be well enough away from the ileocecal valve that we could do a partial see colectomy involving the appendix without having to interfere with the ileocecal valve or do a partial right colectomy.  The tumor was localized at the base of the appendix  with the surgeon's left hand retracting the tumor, we came across the cecum with a GIA-75 stapler. This created a double row staples on the base of the cecum which did not interfere with the ileocecal valve. The specimen was taken off the field and opened and he displayed a 2 cm tubulovillous tumor with margins that appeared to be clear of tumor.  The surgeon changed his gloves and oversewed the staple line using interrupted Lembert stitches of 3-0 silk. Once this was done the colon was allowed to fall back into the peritoneal cavity, the wound was irrigated with saline solution, and then we closed.  The surgeon changed gowns and gloves and redraped the area of the wound. The wound was closed in 3 layers. The posterior rectus sheath was reapproximated using interrupted figure-of-eight stitches of #1 Novafil. The anterior rectus sheath was closed using running looped #1 PDS suture. We injected 0.5% Marcaine into the wound which was combined with epinephrine and closed the skin using a running subcuticular stitch of 3-0 Monocryl. Dermabond Steri-Strips and Tegaderms views complete the dressings. All needle counts, sponge counts, and instrument counts were correct.  PATIENT DISPOSITION:  PACU - hemodynamically stable.   Ademola Daulton Harbaugh 1/23/20179:38 AM

## 2015-12-16 NOTE — H&P (Signed)
  Sergio Hughes 12/09/2015 9:11 AM Location: [facility] Patient #: [id number] DOB: [birth year] Married / Language: English / Race: White Male   The patient is a 75 year old male.  Allergies (Sonya Bynum, CMA; 12/09/2015 9:11 AM) BuPROPion HCl *ANTIDEPRESSANTS* Lovastatin *ANTIHYPERLIPIDEMICS*  Medication History (Sonya Bynum, CMA; 12/09/2015 9:12 AM) Neomycin Sulfate (500MG  Tablet, 2 (two) Tablet Oral SEE NOTE, Taken starting 04/30/2015) Active. (TAKE TWO TABLETS AT 2 PM, 3 PM, AND 10 PM THE DAY PRIOR TO SURGERY) Lisinopril (20MG  Tablet, Oral) Active. Aspirin (81MG  Tablet, Oral) Active. Vitamin D (2000UNIT Tablet, Oral) Active. Glucosamine HCl (1500MG  Tablet, Oral) Active. Ibuprofen (600MG  Tablet, Oral) Active. Omega 3 (1000MG  Capsule, Oral) Active. Cialis (20MG  Tablet, Oral as needed) Active. Medications Reconciled  Vitals (Sonya Bynum CMA; 12/09/2015 9:11 AM) 12/09/2015 9:11 AM Weight: 151 lb Height: 65in Body Surface Area: 1.76 m Body Mass Index: 25.13 kg/m  Temp.: 75F(Temporal)  Pulse: 73 (Regular)  BP: 126/76 (Sitting, Left Arm, Standard) Vital signs today are normal and the patient took his bowel prep without problems.  Physical Exam Jeneen Rinks O. Hulen Skains MD; 12/09/2015 9:36 AM) General:  No distress.  Vital signs are normal General Appearance-Cooperative, Well groomed and Consistent with stated age. Orientation-Oriented X4. Build & Nutrition-Well nourished. Chest and Lung Exam Chest and lung exam reveals -quiet, even and easy respiratory effort with no use of accessory muscles, non-tender and normal tactile fremitus and on auscultation, normal breath sounds, no adventitious sounds and normal vocal resonance. Cardiovascular Cardiovascular examination reveals -on palpation PMI is normal in location and amplitude, no palpable S3 or S4. Normal cardiac borders. and normal heart sounds, regular rate and rhythm with no murmurs. Note: No  murmurs  Abdomen Inspection.  No scars.  No palpable masses. Inspection of the abdomen reveals - No Visible peristalsis and No Hernias. Palpation/Percussion Palpation and Percussion of the abdomen reveal - Soft, Non Tender and No Rigidity (guarding). Auscultation Auscultation of the abdomen reveals - Bowel sounds normal. Rectal examination not performed.   Assessment & Plan Jeneen Rinks O. Shellie Rogoff MD; 12/09/2015 9:38 AM) TUBULOVILLOUS ADENOMA (D36.9) Impression: Surgery scheduled for a week from today. He will get his bowel prep and antibiotics. Preop orders in place Current Plans Pt Education - Pamphlet Given - Colorectal Surgery: discussed with patient and provided information.  Family at the bedside, and risks and benefits discussed.  Kathryne Eriksson. Dahlia Bailiff, MD, College [phone] [facility]

## 2015-12-16 NOTE — Progress Notes (Signed)
Patient transferred to 6N floor from PACU. Family at bedside. No complications at site.

## 2015-12-16 NOTE — Anesthesia Procedure Notes (Signed)
Procedure Name: Intubation Date/Time: 12/16/2015 7:38 AM Performed by: Lance Coon Pre-anesthesia Checklist: Patient identified, Emergency Drugs available, Suction available, Patient being monitored and Timeout performed Patient Re-evaluated:Patient Re-evaluated prior to inductionOxygen Delivery Method: Circle system utilized Preoxygenation: Pre-oxygenation with 100% oxygen Intubation Type: IV induction Ventilation: Mask ventilation without difficulty Laryngoscope Size: Miller and 2 Grade View: Grade I Tube type: Oral Tube size: 7.5 mm Number of attempts: 1 Airway Equipment and Method: Stylet Placement Confirmation: positive ETCO2,  ETT inserted through vocal cords under direct vision and breath sounds checked- equal and bilateral Secured at: 22 cm Tube secured with: Tape Dental Injury: Teeth and Oropharynx as per pre-operative assessment

## 2015-12-17 ENCOUNTER — Encounter (HOSPITAL_COMMUNITY): Payer: Self-pay | Admitting: General Surgery

## 2015-12-17 MED ORDER — METHOCARBAMOL 500 MG PO TABS: 500.0000 mg | ORAL_TABLET | Freq: Three times a day (TID) | ORAL | Status: DC

## 2015-12-17 MED ORDER — METHOCARBAMOL 500 MG PO TABS: 500.0000 mg | ORAL_TABLET | Freq: Four times a day (QID) | ORAL | Status: DC | PRN

## 2015-12-17 MED ORDER — SIMETHICONE 80 MG PO CHEW
80.0000 mg | CHEWABLE_TABLET | Freq: Four times a day (QID) | ORAL | Status: DC | PRN
  Administered 2015-12-17: 80 mg via ORAL
  Filled 2015-12-17: qty 1

## 2015-12-17 NOTE — Progress Notes (Signed)
GS Progress Note Subjective: Patient doing well.  Feeling a bit gaseous.  No nausea or vomiting.    Objective: Vital signs in last 24 hours: Temp:  [96.1 F (35.6 C)-98.6 F (37 C)] 98.2 F (36.8 C) (01/24 0518) Pulse Rate:  [66-92] 92 (01/24 0518) Resp:  [7-20] 20 (01/24 0518) BP: (112-143)/(68-79) 141/72 mmHg (01/24 0518) SpO2:  [94 %-100 %] 94 % (01/24 0518) Last BM Date: 12/16/15  Intake/Output from previous day: 01/23 0701 - 01/24 0700 In: 1800 [I.V.:1800] Out: 1600 [Urine:1600] Intake/Output this shift:    Lungs: clear to auscultation  Abd: Mildly distended, excellent bowel sounds.  Incision is clean and dry.  Extremities: No clinical signs or symptoms of DVT  Neuro: Intact  Lab Results: CBC  No results for input(s): WBC, HGB, HCT, PLT in the last 72 hours. BMET No results for input(s): NA, K, CL, CO2, GLUCOSE, BUN, CREATININE, CALCIUM in the last 72 hours. PT/INR No results for input(s): LABPROT, INR in the last 72 hours. ABG No results for input(s): PHART, HCO3 in the last 72 hours.  Invalid input(s): PCO2, PO2  Studies/Results: No results found.  Anti-infectives: Anti-infectives    Start     Dose/Rate Route Frequency Ordered Stop   12/16/15 2000  cefoTEtan (CEFOTAN) 2 g in dextrose 5 % 50 mL IVPB     2 g 100 mL/hr over 30 Minutes Intravenous Every 12 hours 12/16/15 1139 12/16/15 2006   12/15/15 1027  cefoTEtan (CEFOTAN) 2 g in dextrose 5 % 50 mL IVPB  Status:  Discontinued     2 g 100 mL/hr over 30 Minutes Intravenous On call to O.R. 12/15/15 1027 12/16/15 1139      Assessment/Plan: s/p Procedure(s): PARTIAL CECECTOMY AND APPENDECTOMY Advance diet  LOS: 1 day    Kathryne Eriksson. Dahlia Bailiff, MD, FACS [phone] [facility] Surgery 12/17/2015

## 2015-12-18 MED ORDER — OXYCODONE-ACETAMINOPHEN 5-325 MG PO TABS: 1.0000 | ORAL_TABLET | ORAL | Status: DC | PRN

## 2015-12-18 MED ORDER — SODIUM CHLORIDE 0.9% FLUSH: 3.0000 mL | INTRAVENOUS | Status: DC | PRN

## 2015-12-18 MED ORDER — METHOCARBAMOL 500 MG PO TABS: 500.0000 mg | ORAL_TABLET | Freq: Four times a day (QID) | ORAL | Status: DC | PRN

## 2015-12-18 NOTE — Progress Notes (Signed)
Discharge instructions gone over with patient. Home medications gone over. Prescription given. Follow up appointment is to be scheduled. Incisional care, diet, and activity gone over. Signs and symptoms of infection and reasons to call the doctor discussed. Patient verbalized understanding of instructions.

## 2015-12-18 NOTE — Discharge Summary (Signed)
Physician Discharge Summary  Patient ID: Sergio Hughes MRN: [id number] DOB/AGE: 06-28-1941 75 y.o.  Admit date: 12/16/2015 Discharge date: 12/18/2015  Admission Diagnoses:  Discharge Diagnoses:  Active Problems:   Villous adenoma of right colon   Discharged Condition: good  Hospital Course: Admitted postoperatively after partial cecectomy and appendectomy for tubulovillous adenoma at the appendiceal base/orifice.  Did well postoperatively and was discharged on a soft diet on POD # 2.  Consults: None  Significant Diagnostic Studies: None  Treatments: IV hydration, antibiotics: cefotetan and analgesia: Dilaudid and Percocet  Discharge Exam: Blood pressure 146/72, pulse 87, temperature 98.1 F (36.7 C), temperature source Oral, resp. rate 18, height 5\' 5"  (1.651 m), weight 68.4 kg (150 lb 12.7 oz), SpO2 92 %. General appearance: alert, cooperative, appears stated age and no distress Resp: clear to auscultation bilaterally GI: soft, non-tender; bowel sounds normal; no masses,  no organomegaly and Incision is covered with Tagaderm and only slightly erythematous mediallyMildly distended  Disposition: Final discharge disposition not confirmed  Discharge Instructions    Call MD for:  difficulty breathing, headache or visual disturbances    Complete by:  As directed      Call MD for:  extreme fatigue    Complete by:  As directed      Call MD for:  hives    Complete by:  As directed      Call MD for:  persistant dizziness or light-headedness    Complete by:  As directed      Call MD for:  persistant nausea and vomiting    Complete by:  As directed      Call MD for:  redness, tenderness, or signs of infection (pain, swelling, redness, odor or green/yellow discharge around incision site)    Complete by:  As directed      Call MD for:  severe uncontrolled pain    Complete by:  As directed      Call MD for:  temperature >100.4    Complete by:  As directed      Diet - low sodium heart  healthy    Complete by:  As directed      Driving Restrictions    Complete by:  As directed   No driving for one week.     Increase activity slowly    Complete by:  As directed      Leave dressing on - Keep it clean, dry, and intact until clinic visit    Complete by:  As directed      Lifting restrictions    Complete by:  As directed   No lifting > 20 pounds for the next 6 weeks     Sexual Activity Restrictions    Complete by:  As directed   3 weeks            Medication List    TAKE these medications        aspirin 81 MG EC tablet  Take 81 mg by mouth daily.     Fish Oil 1000 MG Caps  Take 1 capsule by mouth daily.     GLUCOSAMINE 1500 COMPLEX Caps  Take by mouth.     ibuprofen 600 MG tablet  Commonly known as:  ADVIL,MOTRIN  Take 1 tablet (600 mg total) by mouth 2 (two) times daily as needed.     lisinopril 20 MG tablet  Commonly known as:  PRINIVIL,ZESTRIL  Take 0.5 tablets (10 mg total) by mouth daily.  methocarbamol 500 MG tablet  Commonly known as:  ROBAXIN  Take 1 tablet (500 mg total) by mouth every 6 (six) hours as needed for muscle spasms.     oxyCODONE-acetaminophen 5-325 MG tablet  Commonly known as:  PERCOCET/ROXICET  Take 1-2 tablets by mouth every 4 (four) hours as needed for moderate pain.     tadalafil 20 MG tablet  Commonly known as:  CIALIS  Take 20 mg by mouth every other day as needed.     VASCEPA 1 g Caps  Generic drug:  Icosapent Ethyl  Take 2 capsules by mouth 2 (two) times daily.     Vitamin D 2000 units Caps  Take 1 capsule by mouth daily.           Follow-up Information    Follow up with Judeth Horn, MD. Schedule an appointment as soon as possible for a visit in 2 weeks.   Specialty:  General Surgery   Why:  For wound re-check   Contact information:   [address] [place] [address] [phone]       Signed: TARUS ENTIN 12/18/2015, 1:34 PM

## 2015-12-18 NOTE — Discharge Instructions (Signed)
Laparoscopic Appendectomy, Adult, Care After Refer to this sheet in the next few weeks. These instructions provide you with information on caring for yourself after your procedure. Your caregiver may also give you more specific instructions. Your treatment has been planned according to current medical practices, but problems sometimes occur. Call your caregiver if you have any problems or questions after your procedure. HOME CARE INSTRUCTIONS  Do not drive while taking narcotic pain medicines.  You had an extended appendectomy with part of the colon/cecum removed because of the tumor at the base  Use stool softener if you become constipated from your pain medicines.  Change your bandages (dressings) as directed.  Keep your wounds clean and dry. You may wash the wounds gently with soap and water. Gently pat the wounds dry with a clean towel.  Do not take baths, swim, or use hot tubs for 10 days, or as instructed by your caregiver.  Only take over-the-counter or prescription medicines for pain, discomfort, or fever as directed by your caregiver.  You may continue your normal diet as directed.  Do not lift more than 10 pounds (4.5 kg) or play contact sports for 3 weeks, or as directed.  Slowly increase your activity after surgery.  Take deep breaths to avoid getting a lung infection (pneumonia). SEEK MEDICAL CARE IF:  You have redness, swelling, or increasing pain in your wounds.  You have pus coming from your wounds.  You have drainage from a wound that lasts longer than 1 day.  You notice a bad smell coming from the wounds or dressing.  Your wound edges break open after stitches (sutures) have been removed.  You notice increasing pain in the shoulders (shoulder strap areas) or near your shoulder blades.  You develop dizzy episodes or fainting while standing.  You develop shortness of breath.  You develop persistent nausea or vomiting.  You cannot control your bowel  functions or lose your appetite.  You develop diarrhea. SEEK IMMEDIATE MEDICAL CARE IF:   You have a fever.  You develop a rash.  You have difficulty breathing or sharp pains in your chest.  You develop any reaction or side effects to medicines given. MAKE SURE YOU:  Understand these instructions.  Will watch your condition.  Will get help right away if you are not doing well or get worse.   This information is not intended to replace advice given to you by your health care provider. Make sure you discuss any questions you have with your health care provider.   Document Released: 11/09/2005 Document Revised: 03/26/2015 Document Reviewed: 04/29/2015 Elsevier Interactive Patient Education [employer].

## 2015-12-18 NOTE — Progress Notes (Signed)
GS Progress Note Subjective: Patient passed gass this AM but no bowel movement.  No distress.  Eating regular food.  Objective: Vital signs in last 24 hours: Temp:  [97.9 F (36.6 C)-98.3 F (36.8 C)] 98.1 F (36.7 C) (01/25 0534) Pulse Rate:  [87-98] 87 (01/25 0534) Resp:  [16-18] 18 (01/25 0534) BP: (141-150)/(72-86) 146/72 mmHg (01/25 0534) SpO2:  [92 %-97 %] 92 % (01/25 0534) Weight:  [68.4 kg (150 lb 12.7 oz)] 68.4 kg (150 lb 12.7 oz) (01/24 2030) Last BM Date: 12/16/15  Intake/Output from previous day: 01/24 0701 - 01/25 0700 In: 2674.7 [P.O.:1180; I.V.:1494.7] Out: 1900 [Urine:1900] Intake/Output this shift: Total I/O In: 360 [P.O.:360] Out: -   Lungs: Clear  Abd: Soft, distended, but great bowel sounds.  Minimally tender  Extremities: No clinical signs or symptoms of DVT  Neuro: Intact.  Lab Results: CBC  No results for input(s): WBC, HGB, HCT, PLT in the last 72 hours. BMET No results for input(s): NA, K, CL, CO2, GLUCOSE, BUN, CREATININE, CALCIUM in the last 72 hours. PT/INR No results for input(s): LABPROT, INR in the last 72 hours. ABG No results for input(s): PHART, HCO3 in the last 72 hours.  Invalid input(s): PCO2, PO2  Studies/Results: No results found.  Anti-infectives: Anti-infectives    Start     Dose/Rate Route Frequency Ordered Stop   12/16/15 2000  cefoTEtan (CEFOTAN) 2 g in dextrose 5 % 50 mL IVPB     2 g 100 mL/hr over 30 Minutes Intravenous Every 12 hours 12/16/15 1139 12/16/15 2006   12/15/15 1027  cefoTEtan (CEFOTAN) 2 g in dextrose 5 % 50 mL IVPB  Status:  Discontinued     2 g 100 mL/hr over 30 Minutes Intravenous On call to O.R. 12/15/15 1027 12/16/15 1139      Assessment/Plan: s/p Procedure(s): PARTIAL CECECTOMY AND APPENDECTOMY Advance diet Can go home later today if he continutes to pass gas and eat well.  LOS: 2 days    Kathryne Eriksson. Dahlia Bailiff, MD, FACS [phone] [id number] [facility]  Surgery 12/18/2015

## 2015-12-19 ENCOUNTER — Telehealth: Payer: Self-pay | Admitting: *Deleted

## 2015-12-19 NOTE — Telephone Encounter (Signed)
Pt was on TCM list was admitted for Villous adenoma right colon. Pt d/c 1/25, will be f/u w/Dr. Hulen Skains in 2 wks...Johny Chess

## 2015-12-20 ENCOUNTER — Encounter: Payer: Self-pay | Admitting: Internal Medicine

## 2015-12-24 ENCOUNTER — Other Ambulatory Visit: Payer: Self-pay | Admitting: Internal Medicine

## 2015-12-24 MED ORDER — VASCEPA 1 G PO CAPS: 2.0000 | ORAL_CAPSULE | Freq: Two times a day (BID) | ORAL | Status: DC

## 2016-01-27 ENCOUNTER — Ambulatory Visit: Payer: Commercial Managed Care - HMO | Admitting: Internal Medicine

## 2016-02-21 ENCOUNTER — Other Ambulatory Visit: Payer: Self-pay | Admitting: Internal Medicine

## 2016-02-21 ENCOUNTER — Encounter: Payer: Self-pay | Admitting: Internal Medicine

## 2016-02-21 ENCOUNTER — Ambulatory Visit (INDEPENDENT_AMBULATORY_CARE_PROVIDER_SITE_OTHER): Payer: Commercial Managed Care - HMO | Admitting: Internal Medicine

## 2016-02-21 VITALS — BP 140/80 | HR 67 | Wt 150.0 lb

## 2016-02-21 DIAGNOSIS — D489 Neoplasm of uncertain behavior, unspecified: Secondary | ICD-10-CM | POA: Diagnosis not present

## 2016-02-21 DIAGNOSIS — D225 Melanocytic nevi of trunk: Secondary | ICD-10-CM | POA: Diagnosis not present

## 2016-02-21 DIAGNOSIS — L820 Inflamed seborrheic keratosis: Secondary | ICD-10-CM | POA: Diagnosis not present

## 2016-02-21 DIAGNOSIS — D485 Neoplasm of uncertain behavior of skin: Secondary | ICD-10-CM

## 2016-02-21 NOTE — Progress Notes (Signed)
Pre visit review using our clinic review tool, if applicable. No additional management support is needed unless otherwise documented below in the visit note. 

## 2016-02-21 NOTE — Progress Notes (Signed)
Subjective:  Patient ID: DEONTREZ SLAVENS, male    DOB: Apr 28, 1941  Age: 75 y.o. MRN: [id number]  CC: No chief complaint on file.   HPI TEE NORTHUP presents for skin bx  Outpatient Prescriptions Prior to Visit  Medication Sig Dispense Refill  . aspirin 81 MG EC tablet Take 81 mg by mouth daily.      . Cholecalciferol (VITAMIN D) 2000 UNITS CAPS Take 1 capsule by mouth daily.      . Glucosamine-Chondroit-Vit C-Mn (GLUCOSAMINE 1500 COMPLEX) CAPS Take by mouth.    Marland Kitchen ibuprofen (ADVIL,MOTRIN) 600 MG tablet Take 1 tablet (600 mg total) by mouth 2 (two) times daily as needed. 180 tablet 1  . lisinopril (PRINIVIL,ZESTRIL) 20 MG tablet Take 0.5 tablets (10 mg total) by mouth daily. 90 tablet 3  . tadalafil (CIALIS) 20 MG tablet Take 20 mg by mouth every other day as needed. Reported on 02/21/2016    . VASCEPA 1 g CAPS Take 2 capsules by mouth 2 (two) times daily. 360 capsule 3  . methocarbamol (ROBAXIN) 500 MG tablet Take 1 tablet (500 mg total) by mouth every 6 (six) hours as needed for muscle spasms. (Patient not taking: Reported on 02/21/2016) 20 tablet 0  . oxyCODONE-acetaminophen (PERCOCET/ROXICET) 5-325 MG tablet Take 1-2 tablets by mouth every 4 (four) hours as needed for moderate pain. (Patient not taking: Reported on 02/21/2016) 30 tablet 0   No facility-administered medications prior to visit.    ROS Review of Systems  Objective:  BP 140/80 mmHg  Pulse 67  Wt 150 lb (68.04 kg)  SpO2 95%  BP Readings from Last 3 Encounters:  02/21/16 140/80  12/18/15 146/72  12/09/15 122/63    Wt Readings from Last 3 Encounters:  02/21/16 150 lb (68.04 kg)  12/17/15 150 lb 12.7 oz (68.4 kg)  12/09/15 151 lb 12.8 oz (68.856 kg)    Physical Exam  Lab Results  Component Value Date   WBC 7.5 12/09/2015   HGB 14.0 12/09/2015   HCT 41.9 12/09/2015   PLT 208 12/09/2015   GLUCOSE 104* 12/09/2015   CHOL 249* 12/06/2015   TRIG 61.0 12/06/2015   HDL 76.80 12/06/2015   LDLDIRECT 144.9  11/14/2013   LDLCALC 160* 12/06/2015   ALT 27 12/09/2015   AST 29 12/09/2015   NA 141 12/09/2015   K 4.6 12/09/2015   CL 109 12/09/2015   CREATININE 1.42* 12/09/2015   BUN 23* 12/09/2015   CO2 24 12/09/2015   TSH 0.51 12/06/2015   PSA 5.08* 12/06/2015   INR 1.02 12/09/2015   HGBA1C 5.8* 12/09/2015    Procedure Note :     Procedure :  Skin biopsy   Indication:  Changing mole (s ),  Suspicious lesion(s)   Risks including unsuccessful procedure , bleeding, infection, bruising, scar, a need for another complete procedure and others were explained to the patient in detail as well as the benefits. Informed consent was obtained and signed.   The patient was placed in a decubitus position.  Lesion #1 on  L shoulder   measuring  2x2   mm   Skin over lesion #1  was prepped with Betadine and alcohol  and anesthetized with 1/2 cc of 2% lidocaine and epinephrine, using a 25-gauge 1 inch needle.  Shave biopsy with a sterile Dermablade was carried out in the usual fashion. Hyfrecator was used to destroy the rest of the lesion potentially left behind and for hemostasis. Band-Aid was applied with antibiotic ointment.  Lesion #2 on  L shoulder medial from #1   measuring  2x2 mm   Skin over lesion #2  was prepped with Betadine and alcohol  and anesthetized with 1/2cc of 2% lidocaine and epinephrine, using a 25-gauge 1 inch needle.  Shave biopsy with a sterile Dermablade was carried out in the usual fashion. Hyfrecator was used to destroy the rest of the lesion potentially left behind and for hemostasis. Band-Aid was applied with antibiotic ointment.  Lesion #3 on R distal flank   measuring 16x8  mm   Skin over lesion #3  was prepped with Betadine and alcohol  and anesthetized with 1.5 cc of 2% lidocaine and epinephrine, using a 25-gauge 1 inch needle.  Shave biopsy with a sterile Dermablade was carried out in the usual fashion. Hyfrecator was used to destroy the rest of the lesion potentially  left behind and for hemostasis. Band-Aid was applied with antibiotic ointment.   Tolerated well. Complications none.     No results found.  Assessment & Plan:   There are no diagnoses linked to this encounter. I am having Mr. Nuttall maintain his aspirin, tadalafil, Vitamin D, GLUCOSAMINE 1500 COMPLEX, ibuprofen, lisinopril, methocarbamol, oxyCODONE-acetaminophen, and VASCEPA.  No orders of the defined types were placed in this encounter.     Follow-up: No Follow-up on file.  Walker Kehr, MD

## 2016-02-21 NOTE — Assessment & Plan Note (Signed)
See procedure 

## 2016-02-21 NOTE — Patient Instructions (Signed)
Postprocedure instructions :    A Band-Aid should be  changed twice daily. You can take a shower tomorrow.  Keep the wounds clean. You can wash them with liquid soap and water. Pat dry with gauze or a Kleenex tissue  Before applying antibiotic ointment and a Band-Aid.   You need to report immediately  if fever, chills or any signs of infection develop.    The biopsy results should be available in 1 -2 weeks. 

## 2016-03-03 ENCOUNTER — Telehealth: Payer: Self-pay | Admitting: Internal Medicine

## 2016-03-03 NOTE — Telephone Encounter (Signed)
Sergio Hughes, please, inform patient that his skin bx showed that 2 out of 3 moles had a moderate potential to become malignant. He woulds were cauterized aggressively w/a hyfrecator - they should be all gone. Thx

## 2016-03-04 NOTE — Telephone Encounter (Signed)
Pt informed

## 2016-03-18 ENCOUNTER — Ambulatory Visit (INDEPENDENT_AMBULATORY_CARE_PROVIDER_SITE_OTHER): Payer: Commercial Managed Care - HMO | Admitting: Family

## 2016-03-18 ENCOUNTER — Encounter: Payer: Self-pay | Admitting: Family

## 2016-03-18 VITALS — BP 124/90 | HR 71 | Temp 97.6°F | Ht 65.0 in | Wt 151.0 lb

## 2016-03-18 DIAGNOSIS — R059 Cough, unspecified: Secondary | ICD-10-CM

## 2016-03-18 DIAGNOSIS — R05 Cough: Secondary | ICD-10-CM | POA: Diagnosis not present

## 2016-03-18 MED ORDER — BENZONATATE 100 MG PO CAPS: 100.0000 mg | ORAL_CAPSULE | Freq: Three times a day (TID) | ORAL | Status: DC | PRN

## 2016-03-18 MED ORDER — FLUTICASONE PROPIONATE 50 MCG/ACT NA SUSP: 2.0000 | Freq: Every day | NASAL | Status: DC

## 2016-03-18 MED ORDER — FLUTICASONE PROPIONATE 50 MCG/ACT NA SUSP
2.0000 | Freq: Every day | NASAL | Status: DC
Start: 2016-03-18 — End: 2016-11-27

## 2016-03-18 NOTE — Patient Instructions (Signed)
Suspect allergies is triggering your cough.   Trial flonase for post nasal drip.   If there is no improvement in your symptoms, or if there is any worsening of symptoms, or if you have any additional concerns, please return for re-evaluation; or, if we are closed, consider going to the Emergency Room for evaluation if symptoms urgent.

## 2016-03-18 NOTE — Progress Notes (Signed)
Subjective:    Patient ID: Sergio Hughes, male    DOB: [birth year], 75 y.o.   MRN: [id number]   Sergio Hughes is a 75 y.o. male who presents today for an acute visit.    Cough This is a new problem. The current episode started 1 to 4 weeks ago. The problem has been unchanged. The cough is non-productive. Associated symptoms include postnasal drip. Pertinent negatives include no chest pain, chills, ear pain, fever, headaches, myalgias, rhinorrhea, sore throat, shortness of breath or wheezing. The symptoms are aggravated by cold air. Treatments tried: benadryl, robitussin, mucinex DM. His past medical history is significant for environmental allergies. There is no history of asthma, COPD or pneumonia. GERD- resolved   Past Medical History  Diagnosis Date  . Hyperlipidemia   . Hypertension   . Osteoarthritis    Bupropion hcl and Lovastatin Current Outpatient Prescriptions on File Prior to Visit  Medication Sig Dispense Refill  . aspirin 81 MG EC tablet Take 81 mg by mouth daily.      . Cholecalciferol (VITAMIN D) 2000 UNITS CAPS Take 1 capsule by mouth daily.      . Glucosamine-Chondroit-Vit C-Mn (GLUCOSAMINE 1500 COMPLEX) CAPS Take by mouth.    Marland Kitchen ibuprofen (ADVIL,MOTRIN) 600 MG tablet Take 1 tablet (600 mg total) by mouth 2 (two) times daily as needed. 180 tablet 1  . lisinopril (PRINIVIL,ZESTRIL) 20 MG tablet Take 0.5 tablets (10 mg total) by mouth daily. 90 tablet 3  . methocarbamol (ROBAXIN) 500 MG tablet Take 1 tablet (500 mg total) by mouth every 6 (six) hours as needed for muscle spasms. 20 tablet 0  . oxyCODONE-acetaminophen (PERCOCET/ROXICET) 5-325 MG tablet Take 1-2 tablets by mouth every 4 (four) hours as needed for moderate pain. 30 tablet 0  . tadalafil (CIALIS) 20 MG tablet Take 20 mg by mouth every other day as needed. Reported on 02/21/2016    . VASCEPA 1 g CAPS Take 2 capsules by mouth 2 (two) times daily. 360 capsule 3   No current facility-administered medications on file  prior to visit.    Social History  Substance Use Topics  . Smoking status: Former Smoker    Types: Cigarettes    Quit date: 12/14/1965  . Smokeless tobacco: Never Used  . Alcohol Use: 12.6 oz/week    14 Standard drinks or equivalent, 7 Glasses of wine per week     Comment: 1-2 glasses of wine or beer each night    Review of Systems  Constitutional: Negative for fever and chills.  HENT: Positive for postnasal drip. Negative for congestion, ear pain, rhinorrhea, sinus pressure and sore throat.   Respiratory: Positive for cough. Negative for shortness of breath and wheezing.   Cardiovascular: Negative for chest pain and palpitations.  Gastrointestinal: Negative for nausea, vomiting and diarrhea.  Musculoskeletal: Negative for myalgias.  Allergic/Immunologic: Positive for environmental allergies.  Neurological: Negative for headaches.      Objective:    BP 124/90 mmHg  Pulse 71  Temp(Src) 97.6 F (36.4 C) (Oral)  Ht 5\' 5"  (1.651 m)  Wt 151 lb (68.493 kg)  BMI 25.13 kg/m2  SpO2 97%   Physical Exam  Constitutional: Vital signs are normal. He appears well-developed and well-nourished.  HENT:  Head: Normocephalic and atraumatic.  Right Ear: Hearing, tympanic membrane, external ear and ear canal normal. No drainage, swelling or tenderness. Tympanic membrane is not injected, not erythematous and not bulging. No middle ear effusion. No decreased hearing is noted.  Left Ear:  Hearing, tympanic membrane, external ear and ear canal normal. No drainage, swelling or tenderness. Tympanic membrane is not injected, not erythematous and not bulging.  No middle ear effusion. No decreased hearing is noted.  Nose: Rhinorrhea present. Right sinus exhibits no maxillary sinus tenderness and no frontal sinus tenderness. Left sinus exhibits no maxillary sinus tenderness and no frontal sinus tenderness.  Mouth/Throat: Uvula is midline and mucous membranes are normal. Posterior oropharyngeal erythema  present. No oropharyngeal exudate, posterior oropharyngeal edema or tonsillar abscesses.  Eyes: Conjunctivae are normal.  Cardiovascular: Regular rhythm and normal heart sounds.   Pulmonary/Chest: Effort normal and breath sounds normal. No respiratory distress. He has no wheezes. He has no rhonchi. He has no rales.  Lymphadenopathy:       Head (right side): No submental, no submandibular, no tonsillar, no preauricular, no posterior auricular and no occipital adenopathy present.       Head (left side): No submental, no submandibular, no tonsillar, no preauricular, no posterior auricular and no occipital adenopathy present.    He has no cervical adenopathy.  Neurological: He is alert.  Skin: Skin is warm and dry.  Psychiatric: He has a normal mood and affect. His speech is normal and behavior is normal.  Vitals reviewed.      Assessment & Plan:   1. Cough Suspect seasonal allergies contributing to postnasal drip as etiology for cough. No evidence of bacterial infection at this time. Patient and I agreed on conservative therapy.  - fluticasone (FLONASE) 50 MCG/ACT nasal spray; Place 2 sprays into both nostrils daily.  Dispense: 16 g; Refill: 2 - benzonatate (TESSALON PERLES) 100 MG capsule; Take 1 capsule (100 mg total) by mouth 3 (three) times daily as needed for cough.  Dispense: 30 capsule; Refill: 1   I am having Sergio Hughes maintain his aspirin, tadalafil, Vitamin D, GLUCOSAMINE 1500 COMPLEX, ibuprofen, lisinopril, methocarbamol, oxyCODONE-acetaminophen, and VASCEPA.   No orders of the defined types were placed in this encounter.     Start medications as prescribed and explained to patient on After Visit Summary ( AVS). Risks, benefits, and alternatives of the medications and treatment plan prescribed today were discussed, and patient expressed understanding.   Education regarding symptom management and diagnosis given to patient.   Follow-up:Plan follow-up as discussed or as  needed if any worsening symptoms or change in condition. No Follow-up on file.   Continue to follow with Walker Kehr, MD for routine health maintenance.   [name] and I agreed with plan.   Mable Paris, FNP

## 2016-03-18 NOTE — Progress Notes (Signed)
Pre visit review using our clinic review tool, if applicable. No additional management support is needed unless otherwise documented below in the visit note. 

## 2016-06-05 ENCOUNTER — Encounter: Payer: Self-pay | Admitting: Internal Medicine

## 2016-06-05 ENCOUNTER — Ambulatory Visit (INDEPENDENT_AMBULATORY_CARE_PROVIDER_SITE_OTHER): Payer: Commercial Managed Care - HMO | Admitting: Internal Medicine

## 2016-06-05 VITALS — BP 148/90 | HR 63 | Wt 149.0 lb

## 2016-06-05 DIAGNOSIS — I10 Essential (primary) hypertension: Secondary | ICD-10-CM

## 2016-06-05 DIAGNOSIS — M150 Primary generalized (osteo)arthritis: Secondary | ICD-10-CM | POA: Diagnosis not present

## 2016-06-05 DIAGNOSIS — R1031 Right lower quadrant pain: Secondary | ICD-10-CM

## 2016-06-05 DIAGNOSIS — Z0000 Encounter for general adult medical examination without abnormal findings: Secondary | ICD-10-CM

## 2016-06-05 DIAGNOSIS — M159 Polyosteoarthritis, unspecified: Secondary | ICD-10-CM

## 2016-06-05 DIAGNOSIS — E785 Hyperlipidemia, unspecified: Secondary | ICD-10-CM | POA: Diagnosis not present

## 2016-06-05 DIAGNOSIS — G8929 Other chronic pain: Secondary | ICD-10-CM | POA: Insufficient documentation

## 2016-06-05 DIAGNOSIS — N320 Bladder-neck obstruction: Secondary | ICD-10-CM

## 2016-06-05 NOTE — Assessment & Plan Note (Signed)
B hands OA, knees Ibuprofen 600 mg prn Rx 

## 2016-06-05 NOTE — Assessment & Plan Note (Addendum)
2017 post-colectomy when sitting down - on the belt line - poss scar tissue pains Will watch

## 2016-06-05 NOTE — Assessment & Plan Note (Addendum)
Off statins 2017 on Vascepa

## 2016-06-05 NOTE — Assessment & Plan Note (Signed)
Chronic Lisinopril

## 2016-06-05 NOTE — Progress Notes (Signed)
Subjective:  Patient ID: Sergio Hughes, male    DOB: May 25, 1941  Age: 75 y.o. MRN: [id number]  CC: No chief complaint on file.   HPI Sergio Hughes presents for HTN, OA C/o lower abd pain x long time since the colectomy  Outpatient Prescriptions Prior to Visit  Medication Sig Dispense Refill  . aspirin 81 MG EC tablet Take 81 mg by mouth daily.      . benzonatate (TESSALON PERLES) 100 MG capsule Take 1 capsule (100 mg total) by mouth 3 (three) times daily as needed for cough. 30 capsule 1  . Cholecalciferol (VITAMIN D) 2000 UNITS CAPS Take 1 capsule by mouth daily.      . fluticasone (FLONASE) 50 MCG/ACT nasal spray Place 2 sprays into both nostrils daily. 16 g 2  . Glucosamine-Chondroit-Vit C-Mn (GLUCOSAMINE 1500 COMPLEX) CAPS Take by mouth.    Marland Kitchen ibuprofen (ADVIL,MOTRIN) 600 MG tablet Take 1 tablet (600 mg total) by mouth 2 (two) times daily as needed. 180 tablet 1  . lisinopril (PRINIVIL,ZESTRIL) 20 MG tablet Take 0.5 tablets (10 mg total) by mouth daily. 90 tablet 3  . methocarbamol (ROBAXIN) 500 MG tablet Take 1 tablet (500 mg total) by mouth every 6 (six) hours as needed for muscle spasms. 20 tablet 0  . oxyCODONE-acetaminophen (PERCOCET/ROXICET) 5-325 MG tablet Take 1-2 tablets by mouth every 4 (four) hours as needed for moderate pain. 30 tablet 0  . tadalafil (CIALIS) 20 MG tablet Take 20 mg by mouth every other day as needed. Reported on 02/21/2016    . VASCEPA 1 g CAPS Take 2 capsules by mouth 2 (two) times daily. 360 capsule 3   No facility-administered medications prior to visit.    ROS Review of Systems  Constitutional: Negative for appetite change, fatigue and unexpected weight change.  HENT: Negative for congestion, nosebleeds, sneezing, sore throat and trouble swallowing.   Eyes: Negative for itching and visual disturbance.  Respiratory: Negative for cough.   Cardiovascular: Negative for chest pain, palpitations and leg swelling.  Gastrointestinal: Positive for  abdominal pain. Negative for nausea, diarrhea, blood in stool and abdominal distention.  Genitourinary: Negative for frequency and hematuria.  Musculoskeletal: Negative for back pain, joint swelling, gait problem and neck pain.  Skin: Negative for rash.  Neurological: Negative for dizziness, tremors, speech difficulty and weakness.  Psychiatric/Behavioral: Negative for sleep disturbance, dysphoric mood and agitation. The patient is not nervous/anxious.     Objective:  BP 148/90 mmHg  Pulse 63  Wt 149 lb (67.586 kg)  SpO2 95%  BP Readings from Last 3 Encounters:  06/05/16 148/90  03/18/16 124/90  02/21/16 140/80    Wt Readings from Last 3 Encounters:  06/05/16 149 lb (67.586 kg)  03/18/16 151 lb (68.493 kg)  02/21/16 150 lb (68.04 kg)    Physical Exam  Constitutional: He is oriented to person, place, and time. He appears well-developed. No distress.  NAD  HENT:  Mouth/Throat: Oropharynx is clear and moist.  Eyes: Conjunctivae are normal. Pupils are equal, round, and reactive to light.  Neck: Normal range of motion. No JVD present. No thyromegaly present.  Cardiovascular: Normal rate, regular rhythm, normal heart sounds and intact distal pulses.  Exam reveals no gallop and no friction rub.   No murmur heard. Pulmonary/Chest: Effort normal and breath sounds normal. No respiratory distress. He has no wheezes. He has no rales. He exhibits no tenderness.  Abdominal: Soft. Bowel sounds are normal. He exhibits no distension and no mass. There is  no tenderness. There is no rebound and no guarding.  Musculoskeletal: Normal range of motion. He exhibits no edema or tenderness.  Lymphadenopathy:    He has no cervical adenopathy.  Neurological: He is alert and oriented to person, place, and time. He has normal reflexes. No cranial nerve deficit. He exhibits normal muscle tone. He displays a negative Romberg sign. Coordination and gait normal.  Skin: Skin is warm and dry. No rash noted.    Psychiatric: He has a normal mood and affect. His behavior is normal. Judgment and thought content normal.  scar in RLQ well healed  Lab Results  Component Value Date   WBC 7.5 12/09/2015   HGB 14.0 12/09/2015   HCT 41.9 12/09/2015   PLT 208 12/09/2015   GLUCOSE 104* 12/09/2015   CHOL 249* 12/06/2015   TRIG 61.0 12/06/2015   HDL 76.80 12/06/2015   LDLDIRECT 144.9 11/14/2013   LDLCALC 160* 12/06/2015   ALT 27 12/09/2015   AST 29 12/09/2015   NA 141 12/09/2015   K 4.6 12/09/2015   CL 109 12/09/2015   CREATININE 1.42* 12/09/2015   BUN 23* 12/09/2015   CO2 24 12/09/2015   TSH 0.51 12/06/2015   PSA 5.08* 12/06/2015   INR 1.02 12/09/2015   HGBA1C 5.8* 12/09/2015    No results found.  Assessment & Plan:   There are no diagnoses linked to this encounter. I am having [name] maintain his aspirin, tadalafil, Vitamin D, GLUCOSAMINE 1500 COMPLEX, ibuprofen, lisinopril, methocarbamol, oxyCODONE-acetaminophen, VASCEPA, fluticasone, and benzonatate.  No orders of the defined types were placed in this encounter.     Follow-up: No Follow-up on file.  Walker Kehr, MD

## 2016-06-05 NOTE — Progress Notes (Signed)
Pre visit review using our clinic review tool, if applicable. No additional management support is needed unless otherwise documented below in the visit note. 

## 2016-09-07 ENCOUNTER — Other Ambulatory Visit: Payer: Self-pay | Admitting: Internal Medicine

## 2016-09-07 DIAGNOSIS — I10 Essential (primary) hypertension: Secondary | ICD-10-CM

## 2016-09-07 DIAGNOSIS — E785 Hyperlipidemia, unspecified: Secondary | ICD-10-CM

## 2016-09-07 DIAGNOSIS — N320 Bladder-neck obstruction: Secondary | ICD-10-CM

## 2016-09-07 DIAGNOSIS — Z0000 Encounter for general adult medical examination without abnormal findings: Secondary | ICD-10-CM

## 2016-11-27 ENCOUNTER — Ambulatory Visit (INDEPENDENT_AMBULATORY_CARE_PROVIDER_SITE_OTHER): Payer: Medicare HMO | Admitting: Internal Medicine

## 2016-11-27 DIAGNOSIS — R059 Cough, unspecified: Secondary | ICD-10-CM | POA: Insufficient documentation

## 2016-11-27 DIAGNOSIS — R05 Cough: Secondary | ICD-10-CM | POA: Diagnosis not present

## 2016-11-27 MED ORDER — FLUTICASONE PROPIONATE 50 MCG/ACT NA SUSP: 2.0000 | Freq: Every day | NASAL | 2 refills | Status: DC

## 2016-11-27 NOTE — Progress Notes (Signed)
Pre visit review using our clinic review tool, if applicable. No additional management support is needed unless otherwise documented below in the visit note. 

## 2016-11-27 NOTE — Progress Notes (Signed)
Subjective:    Patient ID: Sergio Hughes, male    DOB: [birth year], 76 y.o.   MRN: [id number]  HPI He is here for an acute visit for cold symptoms.  His symptoms started 3 weeks.  His symptoms are mild. He has a persistent cough - typically dry, but occasionally brings up some phlegm. The cough bothers him more during the day. He has some mild nasal congestion and runny nose. He denies any shortness of breath, wheezing, fevers, sore throat, ear pain, sinus pain, chest pain, headaches and lightheadedness.  He has tried taking nyquil, dayquil and cough drops.  He also took benzoate, which he had leftover from last year  - did not help.  The NyQuil made him feel funny.  He has had some reflux recently, but feels this may be related to using some any cough drops.   He has been on his lisinopril for years and has never had an issue. He had a similar occurrence of cough last year after a cold that lasted a while. At that time he was prescribed benzoate and Flonase and it seemed to help.  Medications and allergies reviewed with patient and updated if appropriate.  Patient Active Problem List   Diagnosis Date Noted  . Abdominal pain, chronic, right lower quadrant 06/05/2016  . Villous adenoma of right colon 12/16/2015  . Colon polyps 05/22/2015  . Actinic keratoses 12/09/2014  . Well adult exam 11/11/2012  . Snoring 11/05/2011  . Erectile dysfunction 02/19/2011  . Dizziness 02/19/2011  . Neoplasm of uncertain behavior of skin 08/21/2010  . Osteoarthritis 02/18/2010  . TOBACCO USE, QUIT 02/18/2010  . TESTICULAR PAIN, RIGHT 08/20/2009  . HAND PAIN, RIGHT 02/05/2009  . Dyslipidemia 08/09/2008  . Essential hypertension 08/09/2008    Current Outpatient Prescriptions on File Prior to Visit  Medication Sig Dispense Refill  . aspirin 81 MG EC tablet Take 81 mg by mouth daily.      . benzonatate (TESSALON PERLES) 100 MG capsule Take 1 capsule (100 mg total) by mouth 3 (three) times daily as  needed for cough. 30 capsule 1  . Cholecalciferol (VITAMIN D) 2000 UNITS CAPS Take 1 capsule by mouth daily.      . Glucosamine-Chondroit-Vit C-Mn (GLUCOSAMINE 1500 COMPLEX) CAPS Take by mouth.    Marland Kitchen ibuprofen (ADVIL,MOTRIN) 600 MG tablet TAKE 1 TABLET TWICE DAILY AS NEEDED 180 tablet 1  . lisinopril (PRINIVIL,ZESTRIL) 20 MG tablet TAKE 1/2 TABLET EVERY DAY 45 tablet 3  . tadalafil (CIALIS) 20 MG tablet Take 20 mg by mouth every other day as needed. Reported on 02/21/2016    . VASCEPA 1 g CAPS Take 2 capsules by mouth 2 (two) times daily. 360 capsule 3   No current facility-administered medications on file prior to visit.     Past Medical History:  Diagnosis Date  . Hyperlipidemia   . Hypertension   . Osteoarthritis     Past Surgical History:  Procedure Laterality Date  . COLONOSCOPY    . INGUINAL HERNIA REPAIR  2005   Right  . PARTIAL COLECTOMY Right 12/16/2015   Procedure: PARTIAL CECECTOMY AND APPENDECTOMY;  Surgeon: Judeth Horn, MD;  Location: [facility];  Service: General;  Laterality: Right;  . POLYPECTOMY    . VASECTOMY      Social History   Social History  . Marital status: Married    Spouse name: Kort Stettler  . Number of children: N/A  . Years of education: college    Occupational History  .  retired    Social History Main Topics  . Smoking status: Former Smoker    Types: Cigarettes    Quit date: 12/14/1965  . Smokeless tobacco: Never Used  . Alcohol use 12.6 oz/week    14 Standard drinks or equivalent, 7 Glasses of wine per week     Comment: 1-2 glasses of wine or beer each night  . Drug use: No  . Sexual activity: Not Currently   Other Topics Concern  . Not on file   Social History Narrative   Regular exercise - YES - golf    Family History  Problem Relation Age of Onset  . Hypertension Father   . Cancer Father     brain ca  . Colon cancer Neg Hx   . Rectal cancer Neg Hx   . Stomach cancer Neg Hx   . Esophageal cancer Neg Hx     Review of Systems   Constitutional: Negative for fever.  HENT: Positive for congestion (occ, mild) and rhinorrhea (mild, occ). Negative for ear pain, sinus pain and sore throat.   Respiratory: Positive for cough. Negative for shortness of breath and wheezing.   Cardiovascular: Negative for chest pain.  Neurological: Negative for light-headedness and headaches.       Objective:   Vitals:   11/27/16 1612  BP: 136/88  Pulse: 77  Resp: 16  Temp: 97.7 F (36.5 C)   Filed Weights   11/27/16 1612  Weight: 154 lb (69.9 kg)   Body mass index is 25.63 kg/m.  Wt Readings from Last 3 Encounters:  11/27/16 154 lb (69.9 kg)  06/05/16 149 lb (67.6 kg)  03/18/16 151 lb (68.5 kg)     Physical Exam GENERAL APPEARANCE: Appears stated age, well appearing, NAD EYES: conjunctiva clear, no icterus HEENT: bilateral tympanic membranes and ear canals normal, oropharynx with no erythema, no thyromegaly, trachea midline, no cervical or supraclavicular lymphadenopathy LUNGS: Clear to auscultation without wheeze or crackles, unlabored breathing, good air entry bilaterally HEART: Normal S1,S2 without murmurs EXTREMITIES: Without clubbing, cyanosis, or edema        Assessment & Plan:   See Problem List for Assessment and Plan of chronic medical problems.

## 2016-11-27 NOTE — Patient Instructions (Addendum)
Restart flonase.   Monitor for heartburn, which can also cause a cough.

## 2016-11-27 NOTE — Assessment & Plan Note (Addendum)
Postviral cough Discussed likely causes of PND, GERD and ACE-I induced. We'll try Flonase daily, since this worked last year in reducing his cough Can take over-the-counter cough suppressants as needed Monitor reflux symptoms-may need to take Zantac if cough does not improve May need to consider discontinuing/switching lisinopril Has follow-up with PCP this month

## 2016-11-28 ENCOUNTER — Encounter: Payer: Self-pay | Admitting: Internal Medicine

## 2016-12-08 ENCOUNTER — Ambulatory Visit (INDEPENDENT_AMBULATORY_CARE_PROVIDER_SITE_OTHER): Payer: Medicare HMO | Admitting: Internal Medicine

## 2016-12-08 ENCOUNTER — Other Ambulatory Visit: Payer: Medicare HMO

## 2016-12-08 ENCOUNTER — Encounter: Payer: Self-pay | Admitting: Internal Medicine

## 2016-12-08 VITALS — BP 112/80 | HR 71 | Wt 151.0 lb

## 2016-12-08 DIAGNOSIS — I10 Essential (primary) hypertension: Secondary | ICD-10-CM

## 2016-12-08 DIAGNOSIS — R972 Elevated prostate specific antigen [PSA]: Secondary | ICD-10-CM | POA: Diagnosis not present

## 2016-12-08 DIAGNOSIS — E785 Hyperlipidemia, unspecified: Secondary | ICD-10-CM

## 2016-12-08 DIAGNOSIS — Z0000 Encounter for general adult medical examination without abnormal findings: Secondary | ICD-10-CM | POA: Diagnosis not present

## 2016-12-08 NOTE — Progress Notes (Signed)
Subjective:  Patient ID: Sergio Hughes, male    DOB: 1940/12/13  Age: 76 y.o. MRN: [id number]  CC: No chief complaint on file.   HPI Sergio Hughes presents for a well exam  Outpatient Medications Prior to Visit  Medication Sig Dispense Refill  . aspirin 81 MG EC tablet Take 81 mg by mouth daily.      . benzonatate (TESSALON PERLES) 100 MG capsule Take 1 capsule (100 mg total) by mouth 3 (three) times daily as needed for cough. 30 capsule 1  . Cholecalciferol (VITAMIN D) 2000 UNITS CAPS Take 1 capsule by mouth daily.      . fluticasone (FLONASE) 50 MCG/ACT nasal spray Place 2 sprays into both nostrils daily. 16 g 2  . Glucosamine-Chondroit-Vit C-Mn (GLUCOSAMINE 1500 COMPLEX) CAPS Take by mouth.    Marland Kitchen ibuprofen (ADVIL,MOTRIN) 600 MG tablet TAKE 1 TABLET TWICE DAILY AS NEEDED 180 tablet 1  . lisinopril (PRINIVIL,ZESTRIL) 20 MG tablet TAKE 1/2 TABLET EVERY DAY 45 tablet 3  . tadalafil (CIALIS) 20 MG tablet Take 20 mg by mouth every other day as needed. Reported on 02/21/2016    . VASCEPA 1 g CAPS Take 2 capsules by mouth 2 (two) times daily. 360 capsule 3   No facility-administered medications prior to visit.     ROS Review of Systems  Constitutional: Negative for appetite change, fatigue and unexpected weight change.  HENT: Negative for congestion, nosebleeds, sneezing, sore throat and trouble swallowing.   Eyes: Negative for itching and visual disturbance.  Respiratory: Negative for cough.   Cardiovascular: Negative for chest pain, palpitations and leg swelling.  Gastrointestinal: Negative for abdominal distention, blood in stool, diarrhea and nausea.  Genitourinary: Negative for frequency and hematuria.  Musculoskeletal: Negative for back pain, gait problem, joint swelling and neck pain.  Skin: Negative for rash.  Neurological: Negative for dizziness, tremors, speech difficulty and weakness.  Psychiatric/Behavioral: Negative for agitation, dysphoric mood, sleep disturbance and  suicidal ideas. The patient is not nervous/anxious.     Objective:  BP 112/80   Pulse 71   Wt 151 lb (68.5 kg)   SpO2 96%   BMI 25.13 kg/m   BP Readings from Last 3 Encounters:  12/08/16 112/80  11/27/16 136/88  06/05/16 (!) 148/90    Wt Readings from Last 3 Encounters:  12/08/16 151 lb (68.5 kg)  11/27/16 154 lb (69.9 kg)  06/05/16 149 lb (67.6 kg)    Physical Exam  Constitutional: He is oriented to person, place, and time. He appears well-developed. No distress.  NAD  HENT:  Mouth/Throat: Oropharynx is clear and moist.  Eyes: Conjunctivae are normal. Pupils are equal, round, and reactive to light.  Neck: Normal range of motion. No JVD present. No thyromegaly present.  Cardiovascular: Normal rate, regular rhythm, normal heart sounds and intact distal pulses.  Exam reveals no gallop and no friction rub.   No murmur heard. Pulmonary/Chest: Effort normal and breath sounds normal. No respiratory distress. He has no wheezes. He has no rales. He exhibits no tenderness.  Abdominal: Soft. Bowel sounds are normal. He exhibits no distension and no mass. There is no tenderness. There is no rebound and no guarding.  Genitourinary: Rectum normal. Rectal exam shows guaiac negative stool.  Musculoskeletal: Normal range of motion. He exhibits no edema or tenderness.  Lymphadenopathy:    He has no cervical adenopathy.  Neurological: He is alert and oriented to person, place, and time. He has normal reflexes. No cranial nerve deficit. He exhibits  normal muscle tone. He displays a negative Romberg sign. Coordination and gait normal.  Skin: Skin is warm and dry. No rash noted.  Psychiatric: He has a normal mood and affect. His behavior is normal. Judgment and thought content normal.  prostate 1+  Lab Results  Component Value Date   WBC 7.5 12/09/2015   HGB 14.0 12/09/2015   HCT 41.9 12/09/2015   PLT 208 12/09/2015   GLUCOSE 104 (H) 12/09/2015   CHOL 249 (H) 12/06/2015   TRIG 61.0  12/06/2015   HDL 76.80 12/06/2015   LDLDIRECT 144.9 11/14/2013   LDLCALC 160 (H) 12/06/2015   ALT 27 12/09/2015   AST 29 12/09/2015   NA 141 12/09/2015   K 4.6 12/09/2015   CL 109 12/09/2015   CREATININE 1.42 (H) 12/09/2015   BUN 23 (H) 12/09/2015   CO2 24 12/09/2015   TSH 0.51 12/06/2015   PSA 5.08 (H) 12/06/2015   INR 1.02 12/09/2015   HGBA1C 5.8 (H) 12/09/2015    No results found.  Assessment & Plan:   There are no diagnoses linked to this encounter. I am having Mr. Venable maintain his aspirin, tadalafil, Vitamin D, GLUCOSAMINE 1500 COMPLEX, VASCEPA, benzonatate, ibuprofen, lisinopril, and fluticasone.  No orders of the defined types were placed in this encounter.    Follow-up: No Follow-up on file.  Walker Kehr, MD

## 2016-12-08 NOTE — Assessment & Plan Note (Signed)
Here for medicare wellness/physical  Diet: heart healthy  Physical activity: not sedentary  Depression/mood screen: negative  Hearing: intact to whispered voice  Visual acuity: grossly normal w/glasses, performs annual eye exam  ADLs: capable  Fall risk: low to none  Home safety: good  Cognitive evaluation: intact to orientation, naming, recall and repetition  EOL planning: adv directives, full code/ I agree  I have personally reviewed and have noted  1. The patient's medical, surgical and social history  2. Their use of alcohol, tobacco or illicit drugs  3. Their current medications and supplements  4. The patient's functional ability including ADL's, fall risks, home safety risks and hearing or visual impairment.  5. Diet and physical activities  6. Evidence for depression or mood disorders 7. The roster of all physicians providing medical care to patient - is listed in the Snapshot section of the chart and reviewed today.    Today patient counseled on age appropriate routine health concerns for screening and prevention, each reviewed and up to date or declined. Immunizations reviewed and up to date or declined. Labs ordered and reviewed. Risk factors for depression reviewed and negative. Hearing function and visual acuity are intact. ADLs screened and addressed as needed. Functional ability and level of safety reviewed and appropriate. Education, counseling and referrals performed based on assessed risks today. Patient provided with a copy of personalized plan for preventive services.

## 2016-12-08 NOTE — Assessment & Plan Note (Signed)
Vascepa po

## 2016-12-08 NOTE — Progress Notes (Signed)
Pre visit review using our clinic review tool, if applicable. No additional management support is needed unless otherwise documented below in the visit note. 

## 2016-12-08 NOTE — Assessment & Plan Note (Signed)
Lisinopril 

## 2016-12-08 NOTE — Patient Instructions (Signed)

## 2016-12-09 LAB — PSA, TOTAL AND FREE
PSA, % FREE: 19 % — AB (ref >25)
PSA, FREE: 1.1 ng/mL
PSA, Total: 5.9 ng/mL — ABNORMAL HIGH (ref <=4.0)

## 2016-12-14 ENCOUNTER — Encounter: Payer: Self-pay | Admitting: Internal Medicine

## 2016-12-14 ENCOUNTER — Other Ambulatory Visit: Payer: Self-pay | Admitting: Internal Medicine

## 2016-12-14 DIAGNOSIS — R972 Elevated prostate specific antigen [PSA]: Secondary | ICD-10-CM

## 2016-12-26 ENCOUNTER — Encounter: Payer: Self-pay | Admitting: Internal Medicine

## 2016-12-28 ENCOUNTER — Encounter: Payer: Self-pay | Admitting: Internal Medicine

## 2017-01-01 ENCOUNTER — Telehealth: Payer: Self-pay

## 2017-01-01 ENCOUNTER — Telehealth: Payer: Self-pay | Admitting: Internal Medicine

## 2017-01-01 NOTE — Telephone Encounter (Signed)
I have talked with patient and he states he had physical with dr plotnikov in jan/2018---the only lab ordered for that visit was PSA----routing to dr plotnikov, can you please order the other labs patient needs to get for his physical this year----patient will come back to do labs---thanks

## 2017-01-01 NOTE — Telephone Encounter (Signed)
Patient was wanting all labs ordered he normally gets for his physical each year, the only lab ordered for his physical this year was PSA---I have sent note to dr plotnikov to ask for all lab orders he normally enters for this patient, patient will come back for those labs

## 2017-01-01 NOTE — Telephone Encounter (Signed)
Pt request to speak to the assistant concern about lab work. Please call him back.

## 2017-01-01 NOTE — Telephone Encounter (Signed)
I can see other lab orders too. OK Thx

## 2017-01-19 ENCOUNTER — Telehealth: Payer: Self-pay | Admitting: Internal Medicine

## 2017-01-19 NOTE — Telephone Encounter (Signed)
Routing to dr plotnikov---are you ok with labs that are currently entered being done by patient---please advise, I will call patient back

## 2017-01-19 NOTE — Telephone Encounter (Signed)
Patient states he seen Dr. Camila Li in January and he only did a PSA.  Patient states he spoke with Dr. Alain Marion and he stated he would enter the rest of the labs for him to have done.  I see there is labs showing from 2017.  Could he use these without having the PSA?  Please follow up with patient in regard.

## 2017-01-20 NOTE — Telephone Encounter (Signed)
Yes pls Thx

## 2017-01-21 NOTE — Telephone Encounter (Signed)
Advised patient that lab orders are entered

## 2017-01-22 ENCOUNTER — Other Ambulatory Visit (INDEPENDENT_AMBULATORY_CARE_PROVIDER_SITE_OTHER): Payer: Medicare HMO

## 2017-01-22 DIAGNOSIS — G8929 Other chronic pain: Secondary | ICD-10-CM

## 2017-01-22 DIAGNOSIS — Z0000 Encounter for general adult medical examination without abnormal findings: Secondary | ICD-10-CM | POA: Diagnosis not present

## 2017-01-22 DIAGNOSIS — E785 Hyperlipidemia, unspecified: Secondary | ICD-10-CM | POA: Diagnosis not present

## 2017-01-22 DIAGNOSIS — N320 Bladder-neck obstruction: Secondary | ICD-10-CM | POA: Diagnosis not present

## 2017-01-22 DIAGNOSIS — R1031 Right lower quadrant pain: Secondary | ICD-10-CM | POA: Diagnosis not present

## 2017-01-22 LAB — BASIC METABOLIC PANEL
BUN: 28 mg/dL — ABNORMAL HIGH (ref 6–23)
CALCIUM: 9.2 mg/dL (ref 8.4–10.5)
CO2: 30 meq/L (ref 19–32)
Chloride: 104 mEq/L (ref 96–112)
Creatinine, Ser: 1.7 mg/dL — ABNORMAL HIGH (ref 0.40–1.50)
GFR: 41.9 mL/min — ABNORMAL LOW (ref >60.00)
GLUCOSE: 95 mg/dL (ref 70–99)
Potassium: 5.2 mEq/L — ABNORMAL HIGH (ref 3.5–5.1)
Sodium: 139 mEq/L (ref 135–145)

## 2017-01-22 LAB — URINALYSIS
BILIRUBIN URINE: NEGATIVE
HGB URINE DIPSTICK: NEGATIVE
Ketones, ur: NEGATIVE
Leukocytes, UA: NEGATIVE
Nitrite: NEGATIVE
Specific Gravity, Urine: 1.015 (ref 1.000–1.030)
Total Protein, Urine: NEGATIVE
URINE GLUCOSE: NEGATIVE
UROBILINOGEN UA: 0.2 (ref 0.0–1.0)
pH: 5.5 (ref 5.0–8.0)

## 2017-01-22 LAB — CBC WITH DIFFERENTIAL/PLATELET
Basophils Absolute: 0.1 10*3/uL (ref 0.0–0.1)
Basophils Relative: 1.2 % (ref 0.0–3.0)
EOS PCT: 2.9 % (ref 0.0–5.0)
Eosinophils Absolute: 0.3 10*3/uL (ref 0.0–0.7)
HCT: 41 % (ref 39.0–52.0)
HEMOGLOBIN: 13.8 g/dL (ref 13.0–17.0)
Lymphocytes Relative: 23.8 % (ref 12.0–46.0)
Lymphs Abs: 2.1 10*3/uL (ref 0.7–4.0)
MCHC: 33.6 g/dL (ref 30.0–36.0)
MCV: 91.8 fl (ref 78.0–100.0)
MONOS PCT: 10.5 % (ref 3.0–12.0)
Monocytes Absolute: 0.9 10*3/uL (ref 0.1–1.0)
Neutro Abs: 5.4 10*3/uL (ref 1.4–7.7)
Neutrophils Relative %: 61.6 % (ref 43.0–77.0)
Platelets: 237 10*3/uL (ref 150.0–400.0)
RBC: 4.47 Mil/uL (ref 4.22–5.81)
RDW: 12.9 % (ref 11.5–15.5)
WBC: 8.8 10*3/uL (ref 4.0–10.5)

## 2017-01-22 LAB — LIPID PANEL
Cholesterol: 190 mg/dL (ref 0–200)
HDL: 56.5 mg/dL (ref >39.00)
LDL Cholesterol: 124 mg/dL — ABNORMAL HIGH (ref 0–99)
NONHDL: 133.38
Total CHOL/HDL Ratio: 3
Triglycerides: 49 mg/dL (ref 0.0–149.0)
VLDL: 9.8 mg/dL (ref 0.0–40.0)

## 2017-01-22 LAB — HEPATIC FUNCTION PANEL
ALBUMIN: 3.7 g/dL (ref 3.5–5.2)
ALK PHOS: 58 U/L (ref 39–117)
ALT: 42 U/L (ref 0–53)
AST: 39 U/L — ABNORMAL HIGH (ref 0–37)
Bilirubin, Direct: 0.1 mg/dL (ref 0.0–0.3)
TOTAL PROTEIN: 6.4 g/dL (ref 6.0–8.3)
Total Bilirubin: 0.5 mg/dL (ref 0.2–1.2)

## 2017-01-22 LAB — PSA: PSA: 5.35 ng/mL — ABNORMAL HIGH (ref 0.10–4.00)

## 2017-01-22 LAB — TSH: TSH: 0.56 u[IU]/mL (ref 0.35–4.50)

## 2017-01-27 ENCOUNTER — Other Ambulatory Visit: Payer: Self-pay | Admitting: Internal Medicine

## 2017-01-27 DIAGNOSIS — R7989 Other specified abnormal findings of blood chemistry: Secondary | ICD-10-CM

## 2017-01-27 DIAGNOSIS — E785 Hyperlipidemia, unspecified: Secondary | ICD-10-CM

## 2017-01-27 DIAGNOSIS — R972 Elevated prostate specific antigen [PSA]: Secondary | ICD-10-CM

## 2017-02-03 ENCOUNTER — Ambulatory Visit (INDEPENDENT_AMBULATORY_CARE_PROVIDER_SITE_OTHER): Payer: Medicare HMO | Admitting: Urology

## 2017-02-03 DIAGNOSIS — R351 Nocturia: Secondary | ICD-10-CM | POA: Diagnosis not present

## 2017-02-03 DIAGNOSIS — R972 Elevated prostate specific antigen [PSA]: Secondary | ICD-10-CM

## 2017-05-24 ENCOUNTER — Other Ambulatory Visit (INDEPENDENT_AMBULATORY_CARE_PROVIDER_SITE_OTHER): Payer: Medicare HMO

## 2017-05-24 DIAGNOSIS — R7989 Other specified abnormal findings of blood chemistry: Secondary | ICD-10-CM

## 2017-05-24 DIAGNOSIS — E785 Hyperlipidemia, unspecified: Secondary | ICD-10-CM

## 2017-05-24 DIAGNOSIS — R972 Elevated prostate specific antigen [PSA]: Secondary | ICD-10-CM

## 2017-05-24 LAB — LIPID PANEL
CHOL/HDL RATIO: 3
CHOLESTEROL: 219 mg/dL — AB (ref 0–200)
HDL: 67.7 mg/dL (ref >39.00)
LDL CALC: 142 mg/dL — AB (ref 0–99)
NonHDL: 151.16
TRIGLYCERIDES: 48 mg/dL (ref 0.0–149.0)
VLDL: 9.6 mg/dL (ref 0.0–40.0)

## 2017-05-24 LAB — URINALYSIS
Bilirubin Urine: NEGATIVE
Hgb urine dipstick: NEGATIVE
Ketones, ur: NEGATIVE
LEUKOCYTES UA: NEGATIVE
Nitrite: NEGATIVE
PH: 6 (ref 5.0–8.0)
SPECIFIC GRAVITY, URINE: 1.02 (ref 1.000–1.030)
Total Protein, Urine: NEGATIVE
UROBILINOGEN UA: 0.2 (ref 0.0–1.0)
Urine Glucose: NEGATIVE

## 2017-05-24 LAB — BASIC METABOLIC PANEL
BUN: 25 mg/dL — ABNORMAL HIGH (ref 6–23)
CALCIUM: 9.4 mg/dL (ref 8.4–10.5)
CHLORIDE: 106 meq/L (ref 96–112)
CO2: 27 meq/L (ref 19–32)
Creatinine, Ser: 1.68 mg/dL — ABNORMAL HIGH (ref 0.40–1.50)
GFR: 42.44 mL/min — ABNORMAL LOW (ref >60.00)
GLUCOSE: 102 mg/dL — AB (ref 70–99)
Potassium: 4.9 mEq/L (ref 3.5–5.1)
SODIUM: 141 meq/L (ref 135–145)

## 2017-05-24 LAB — PSA: PSA: 6.51 ng/mL — ABNORMAL HIGH (ref 0.10–4.00)

## 2017-06-07 ENCOUNTER — Encounter: Payer: Self-pay | Admitting: Internal Medicine

## 2017-06-07 ENCOUNTER — Ambulatory Visit (INDEPENDENT_AMBULATORY_CARE_PROVIDER_SITE_OTHER): Payer: Medicare HMO | Admitting: Internal Medicine

## 2017-06-07 ENCOUNTER — Other Ambulatory Visit: Payer: Self-pay | Admitting: Internal Medicine

## 2017-06-07 DIAGNOSIS — R079 Chest pain, unspecified: Secondary | ICD-10-CM | POA: Diagnosis not present

## 2017-06-07 DIAGNOSIS — G44209 Tension-type headache, unspecified, not intractable: Secondary | ICD-10-CM | POA: Diagnosis not present

## 2017-06-07 DIAGNOSIS — R519 Headache, unspecified: Secondary | ICD-10-CM | POA: Insufficient documentation

## 2017-06-07 DIAGNOSIS — R51 Headache: Secondary | ICD-10-CM

## 2017-06-07 NOTE — Assessment & Plan Note (Signed)
R frontal x2 weeks in am's Head CT if not better

## 2017-06-07 NOTE — Assessment & Plan Note (Signed)
EKG

## 2017-06-07 NOTE — Progress Notes (Signed)
Subjective:  Patient ID: Sergio Hughes, male    DOB: 07-Apr-1941  Age: 76 y.o. MRN: [id number]  CC: No chief complaint on file.   HPI MAKALE PINDELL presents for HTN, dyslipidemia f/u C/o R frontal x2 weeks in am's - would resolve w/relaxation C/o L CP with lifting x 2 weeks off and on; no DOE  Outpatient Medications Prior to Visit  Medication Sig Dispense Refill  . aspirin 81 MG EC tablet Take 81 mg by mouth daily.      . Cholecalciferol (VITAMIN D) 2000 UNITS CAPS Take 1 capsule by mouth daily.      . fluticasone (FLONASE) 50 MCG/ACT nasal spray Place 2 sprays into both nostrils daily. 16 g 2  . Glucosamine-Chondroit-Vit C-Mn (GLUCOSAMINE 1500 COMPLEX) CAPS Take by mouth.    . tadalafil (CIALIS) 20 MG tablet Take 20 mg by mouth every other day as needed. Reported on 02/21/2016    . VASCEPA 1 g CAPS Take 2 capsules by mouth 2 (two) times daily. 360 capsule 3   No facility-administered medications prior to visit.     ROS Review of Systems  Constitutional: Negative for appetite change, fatigue and unexpected weight change.  HENT: Negative for congestion, nosebleeds, sneezing, sore throat and trouble swallowing.   Eyes: Negative for itching and visual disturbance.  Respiratory: Negative for cough.   Cardiovascular: Positive for chest pain. Negative for palpitations and leg swelling.  Gastrointestinal: Negative for abdominal distention, blood in stool, diarrhea and nausea.  Genitourinary: Negative for frequency and hematuria.  Musculoskeletal: Negative for back pain, gait problem, joint swelling and neck pain.  Skin: Negative for rash.  Neurological: Positive for headaches. Negative for dizziness, tremors, speech difficulty and weakness.  Psychiatric/Behavioral: Negative for agitation, dysphoric mood and sleep disturbance. The patient is not nervous/anxious.     Objective:  BP 118/80 (BP Location: Left Arm, Patient Position: Sitting, Cuff Size: Normal)   Pulse 65   Temp 97.9 F  (36.6 C) (Oral)   Ht 5\' 5"  (1.651 m)   Wt 148 lb (67.1 kg)   SpO2 98%   BMI 24.63 kg/m   BP Readings from Last 3 Encounters:  06/07/17 118/80  12/08/16 112/80  11/27/16 136/88    Wt Readings from Last 3 Encounters:  06/07/17 148 lb (67.1 kg)  12/08/16 151 lb (68.5 kg)  11/27/16 154 lb (69.9 kg)    Physical Exam  Constitutional: He is oriented to person, place, and time. He appears well-developed. No distress.  NAD  HENT:  Mouth/Throat: Oropharynx is clear and moist.  Eyes: Pupils are equal, round, and reactive to light. Conjunctivae are normal.  Neck: Normal range of motion. No JVD present. No thyromegaly present.  Cardiovascular: Normal rate, regular rhythm, normal heart sounds and intact distal pulses.  Exam reveals no gallop and no friction rub.   No murmur heard. Pulmonary/Chest: Effort normal and breath sounds normal. No respiratory distress. He has no wheezes. He has no rales. He exhibits no tenderness.  Abdominal: Soft. Bowel sounds are normal. He exhibits no distension and no mass. There is no tenderness. There is no rebound and no guarding.  Musculoskeletal: Normal range of motion. He exhibits no edema or tenderness.  Lymphadenopathy:    He has no cervical adenopathy.  Neurological: He is alert and oriented to person, place, and time. He has normal reflexes. No cranial nerve deficit. He exhibits normal muscle tone. He displays a negative Romberg sign. Coordination and gait normal.  Skin: Skin is warm  and dry. No rash noted.  Psychiatric: He has a normal mood and affect. His behavior is normal. Judgment and thought content normal.   Procedure: EKG Indication: chest pain Impression: NSR. RBBB. No acute changes   Lab Results  Component Value Date   WBC 8.8 01/22/2017   HGB 13.8 01/22/2017   HCT 41.0 01/22/2017   PLT 237.0 01/22/2017   GLUCOSE 102 (H) 05/24/2017   CHOL 219 (H) 05/24/2017   TRIG 48.0 05/24/2017   HDL 67.70 05/24/2017   LDLDIRECT 144.9  11/14/2013   LDLCALC 142 (H) 05/24/2017   ALT 42 01/22/2017   AST 39 (H) 01/22/2017   NA 141 05/24/2017   K 4.9 05/24/2017   CL 106 05/24/2017   CREATININE 1.68 (H) 05/24/2017   BUN 25 (H) 05/24/2017   CO2 27 05/24/2017   TSH 0.56 01/22/2017   PSA 6.51 (H) 05/24/2017   INR 1.02 12/09/2015   HGBA1C 5.8 (H) 12/09/2015    No results found.  Assessment & Plan:   Diagnoses and all orders for this visit:  Chest pain, unspecified type  Acute non intractable tension-type headache   I am having Mr. Nylund maintain his aspirin, tadalafil, Vitamin D, GLUCOSAMINE 1500 COMPLEX, VASCEPA, and fluticasone.  No orders of the defined types were placed in this encounter.    Follow-up: No Follow-up on file.  Walker Kehr, MD

## 2017-06-21 ENCOUNTER — Telehealth (HOSPITAL_COMMUNITY): Payer: Self-pay | Admitting: *Deleted

## 2017-06-21 NOTE — Telephone Encounter (Signed)
Left message on voicemail in reference to upcoming appointment scheduled for 06/23/17. Phone number given for a call back so details instructions can be given.  Sergio Hughes

## 2017-06-23 ENCOUNTER — Ambulatory Visit (HOSPITAL_COMMUNITY): Payer: Medicare HMO | Attending: Cardiology

## 2017-06-23 DIAGNOSIS — I2510 Atherosclerotic heart disease of native coronary artery without angina pectoris: Secondary | ICD-10-CM | POA: Insufficient documentation

## 2017-06-23 DIAGNOSIS — I10 Essential (primary) hypertension: Secondary | ICD-10-CM | POA: Insufficient documentation

## 2017-06-23 DIAGNOSIS — R079 Chest pain, unspecified: Secondary | ICD-10-CM

## 2017-06-23 DIAGNOSIS — I779 Disorder of arteries and arterioles, unspecified: Secondary | ICD-10-CM | POA: Insufficient documentation

## 2017-06-23 LAB — MYOCARDIAL PERFUSION IMAGING
CHL CUP MPHR: 144 {beats}/min
Estimated workload: 10.1 METS
Exercise duration (min): 8 min
Exercise duration (sec): 0 s
LHR: 0.28
LV sys vol: 29 mL
LVDIAVOL: 68 mL (ref 62–150)
NUC STRESS TID: 0.76
Peak HR: 146 {beats}/min
Percent HR: 101 %
Rest HR: 62 {beats}/min
SDS: 1
SRS: 0
SSS: 1

## 2017-06-23 MED ORDER — TECHNETIUM TC 99M TETROFOSMIN IV KIT
32.9000 | PACK | Freq: Once | INTRAVENOUS | Status: AC | PRN
  Administered 2017-06-23: 32.9 via INTRAVENOUS
  Filled 2017-06-23: qty 33

## 2017-06-23 MED ORDER — TECHNETIUM TC 99M TETROFOSMIN IV KIT
10.7000 | PACK | Freq: Once | INTRAVENOUS | Status: AC | PRN
  Administered 2017-06-23: 10.7 via INTRAVENOUS
  Filled 2017-06-23: qty 11

## 2017-06-27 ENCOUNTER — Telehealth: Payer: Self-pay | Admitting: Internal Medicine

## 2017-06-27 NOTE — Telephone Encounter (Signed)
Sergio Hughes, Please inform the patient that his stress test is negative.  Thanks, AP

## 2017-06-29 NOTE — Telephone Encounter (Signed)
pts wife notified.

## 2017-07-28 DIAGNOSIS — R972 Elevated prostate specific antigen [PSA]: Secondary | ICD-10-CM | POA: Diagnosis not present

## 2017-08-04 ENCOUNTER — Ambulatory Visit (INDEPENDENT_AMBULATORY_CARE_PROVIDER_SITE_OTHER): Payer: Medicare HMO | Admitting: Urology

## 2017-08-04 DIAGNOSIS — R351 Nocturia: Secondary | ICD-10-CM

## 2017-08-04 DIAGNOSIS — R972 Elevated prostate specific antigen [PSA]: Secondary | ICD-10-CM

## 2017-09-29 ENCOUNTER — Other Ambulatory Visit (INDEPENDENT_AMBULATORY_CARE_PROVIDER_SITE_OTHER): Payer: Medicare HMO

## 2017-09-29 ENCOUNTER — Other Ambulatory Visit: Payer: Self-pay

## 2017-09-29 DIAGNOSIS — E785 Hyperlipidemia, unspecified: Secondary | ICD-10-CM | POA: Diagnosis not present

## 2017-09-29 DIAGNOSIS — R972 Elevated prostate specific antigen [PSA]: Secondary | ICD-10-CM | POA: Diagnosis not present

## 2017-09-29 DIAGNOSIS — I10 Essential (primary) hypertension: Secondary | ICD-10-CM | POA: Diagnosis not present

## 2017-09-29 LAB — LIPID PANEL
CHOL/HDL RATIO: 4
Cholesterol: 239 mg/dL — ABNORMAL HIGH (ref 0–200)
HDL: 65.9 mg/dL (ref >39.00)
LDL Cholesterol: 158 mg/dL — ABNORMAL HIGH (ref 0–99)
NONHDL: 173.4
Triglycerides: 76 mg/dL (ref 0.0–149.0)
VLDL: 15.2 mg/dL (ref 0.0–40.0)

## 2017-09-29 LAB — URINALYSIS
BILIRUBIN URINE: NEGATIVE
HGB URINE DIPSTICK: NEGATIVE
KETONES UR: NEGATIVE
LEUKOCYTES UA: NEGATIVE
Nitrite: NEGATIVE
PH: 5.5 (ref 5.0–8.0)
Specific Gravity, Urine: 1.025 (ref 1.000–1.030)
UROBILINOGEN UA: 0.2 (ref 0.0–1.0)
Urine Glucose: NEGATIVE

## 2017-09-29 LAB — PSA: PSA: 6.35 ng/mL — ABNORMAL HIGH (ref 0.10–4.00)

## 2017-09-29 LAB — BASIC METABOLIC PANEL
BUN: 25 mg/dL — AB (ref 6–23)
CALCIUM: 9.9 mg/dL (ref 8.4–10.5)
CO2: 29 meq/L (ref 19–32)
CREATININE: 1.6 mg/dL — AB (ref 0.40–1.50)
Chloride: 104 mEq/L (ref 96–112)
GFR: 44.85 mL/min — AB (ref >60.00)
GLUCOSE: 103 mg/dL — AB (ref 70–99)
Potassium: 4.3 mEq/L (ref 3.5–5.1)
Sodium: 139 mEq/L (ref 135–145)

## 2017-10-07 ENCOUNTER — Ambulatory Visit: Payer: Medicare HMO | Admitting: Internal Medicine

## 2017-10-07 ENCOUNTER — Encounter: Payer: Self-pay | Admitting: Internal Medicine

## 2017-10-07 DIAGNOSIS — R079 Chest pain, unspecified: Secondary | ICD-10-CM

## 2017-10-07 DIAGNOSIS — N528 Other male erectile dysfunction: Secondary | ICD-10-CM

## 2017-10-07 DIAGNOSIS — E785 Hyperlipidemia, unspecified: Secondary | ICD-10-CM

## 2017-10-07 DIAGNOSIS — I10 Essential (primary) hypertension: Secondary | ICD-10-CM

## 2017-10-07 DIAGNOSIS — R972 Elevated prostate specific antigen [PSA]: Secondary | ICD-10-CM | POA: Diagnosis not present

## 2017-10-07 MED ORDER — PITAVASTATIN CALCIUM 2 MG PO TABS: 1.0000 | ORAL_TABLET | Freq: Every day | ORAL | 11 refills | Status: DC

## 2017-10-07 MED ORDER — AMLODIPINE BESYLATE 2.5 MG PO TABS: 2.5000 mg | ORAL_TABLET | Freq: Every day | ORAL | 3 refills | Status: DC

## 2017-10-07 NOTE — Assessment & Plan Note (Signed)
Cialis prn

## 2017-10-07 NOTE — Assessment & Plan Note (Addendum)
Lisinopril - d/c due to elev creat Started Norvasc

## 2017-10-07 NOTE — Assessment & Plan Note (Signed)
Worse D/c Vascepa - too $$$ Trial of Livalo

## 2017-10-07 NOTE — Assessment & Plan Note (Signed)
F/u w/Urol 

## 2017-10-07 NOTE — Assessment & Plan Note (Signed)
Discussed: Myocardial perfusion is normal. The study is normal. Overall left ventricular systolic function was normal. LV cavity size is normal. Nuclear stress EF: 57%. The left ventricular ejection fraction is normal (55-65%). There are no images available for comparison.

## 2017-10-07 NOTE — Progress Notes (Signed)
Subjective:  Patient ID: Sergio Hughes, male    DOB: 22-Jan-1941  Age: 76 y.o. MRN: [id number]  CC: No chief complaint on file.   HPI DERIN GRANQUIST presents for elevated PSA, ED, dyslipidemia. Urol eval was (-) Dr Alyson Ingles.  Outpatient Medications Prior to Visit  Medication Sig Dispense Refill  . aspirin 81 MG EC tablet Take 81 mg by mouth daily.      . Cholecalciferol (VITAMIN D) 2000 UNITS CAPS Take 1 capsule by mouth daily.      . fluticasone (FLONASE) 50 MCG/ACT nasal spray Place 2 sprays into both nostrils daily. 16 g 2  . Glucosamine-Chondroit-Vit C-Mn (GLUCOSAMINE 1500 COMPLEX) CAPS Take by mouth.    . tadalafil (CIALIS) 20 MG tablet Take 20 mg by mouth every other day as needed. Reported on 02/21/2016    . VASCEPA 1 g CAPS TAKE 2 CAPSULES TWICE DAILY 360 capsule 2   No facility-administered medications prior to visit.     ROS Review of Systems  Constitutional: Negative for appetite change, fatigue and unexpected weight change.  HENT: Negative for congestion, nosebleeds, sneezing, sore throat and trouble swallowing.   Eyes: Negative for itching and visual disturbance.  Respiratory: Negative for cough.   Cardiovascular: Negative for chest pain, palpitations and leg swelling.  Gastrointestinal: Negative for abdominal distention, blood in stool, diarrhea and nausea.  Genitourinary: Negative for frequency and hematuria.  Musculoskeletal: Positive for arthralgias. Negative for back pain, gait problem, joint swelling and neck pain.  Skin: Negative for rash.  Neurological: Negative for dizziness, tremors, speech difficulty and weakness.  Psychiatric/Behavioral: Negative for agitation, dysphoric mood and sleep disturbance. The patient is not nervous/anxious.     Objective:  BP (!) 142/82 (BP Location: Left Arm, Patient Position: Sitting, Cuff Size: Normal)   Pulse 62   Temp 98 F (36.7 C) (Oral)   Ht 5\' 5"  (1.651 m)   Wt 154 lb (69.9 kg)   SpO2 99%   BMI 25.63 kg/m   BP  Readings from Last 3 Encounters:  10/07/17 (!) 142/82  06/07/17 118/80  12/08/16 112/80    Wt Readings from Last 3 Encounters:  10/07/17 154 lb (69.9 kg)  06/23/17 148 lb (67.1 kg)  06/07/17 148 lb (67.1 kg)    Physical Exam  Constitutional: He is oriented to person, place, and time. He appears well-developed. No distress.  NAD  HENT:  Mouth/Throat: Oropharynx is clear and moist.  Eyes: Conjunctivae are normal. Pupils are equal, round, and reactive to light.  Neck: Normal range of motion. No JVD present. No thyromegaly present.  Cardiovascular: Normal rate, regular rhythm, normal heart sounds and intact distal pulses. Exam reveals no gallop and no friction rub.  No murmur heard. Pulmonary/Chest: Effort normal and breath sounds normal. No respiratory distress. He has no wheezes. He has no rales. He exhibits no tenderness.  Abdominal: Soft. Bowel sounds are normal. He exhibits no distension and no mass. There is no tenderness. There is no rebound and no guarding.  Musculoskeletal: Normal range of motion. He exhibits no edema or tenderness.  Lymphadenopathy:    He has no cervical adenopathy.  Neurological: He is alert and oriented to person, place, and time. He has normal reflexes. No cranial nerve deficit. He exhibits normal muscle tone. He displays a negative Romberg sign. Coordination and gait normal.  Skin: Skin is warm and dry. No rash noted.  Psychiatric: He has a normal mood and affect. His behavior is normal. Judgment and thought content normal.  Lab Results  Component Value Date   WBC 8.8 01/22/2017   HGB 13.8 01/22/2017   HCT 41.0 01/22/2017   PLT 237.0 01/22/2017   GLUCOSE 103 (H) 09/29/2017   CHOL 239 (H) 09/29/2017   TRIG 76.0 09/29/2017   HDL 65.90 09/29/2017   LDLDIRECT 144.9 11/14/2013   LDLCALC 158 (H) 09/29/2017   ALT 42 01/22/2017   AST 39 (H) 01/22/2017   NA 139 09/29/2017   K 4.3 09/29/2017   CL 104 09/29/2017   CREATININE 1.60 (H) 09/29/2017   BUN  25 (H) 09/29/2017   CO2 29 09/29/2017   TSH 0.56 01/22/2017   PSA 6.35 (H) 09/29/2017   INR 1.02 12/09/2015   HGBA1C 5.8 (H) 12/09/2015    No results found.  Assessment & Plan:   There are no diagnoses linked to this encounter. I am having [name] "[name]" maintain his aspirin, tadalafil, Vitamin D, GLUCOSAMINE 1500 COMPLEX, fluticasone, and VASCEPA.  No orders of the defined types were placed in this encounter.    Follow-up: No Follow-up on file.  Walker Kehr, MD

## 2017-10-08 ENCOUNTER — Encounter: Payer: Self-pay | Admitting: Internal Medicine

## 2018-01-19 DIAGNOSIS — R972 Elevated prostate specific antigen [PSA]: Secondary | ICD-10-CM | POA: Diagnosis not present

## 2018-01-26 ENCOUNTER — Ambulatory Visit: Payer: Medicare HMO | Admitting: Urology

## 2018-01-26 DIAGNOSIS — R351 Nocturia: Secondary | ICD-10-CM | POA: Diagnosis not present

## 2018-01-26 DIAGNOSIS — R972 Elevated prostate specific antigen [PSA]: Secondary | ICD-10-CM | POA: Diagnosis not present

## 2018-01-31 ENCOUNTER — Other Ambulatory Visit (INDEPENDENT_AMBULATORY_CARE_PROVIDER_SITE_OTHER): Payer: Medicare HMO

## 2018-01-31 DIAGNOSIS — E785 Hyperlipidemia, unspecified: Secondary | ICD-10-CM

## 2018-01-31 DIAGNOSIS — I10 Essential (primary) hypertension: Secondary | ICD-10-CM

## 2018-01-31 LAB — BASIC METABOLIC PANEL
BUN: 31 mg/dL — ABNORMAL HIGH (ref 6–23)
CO2: 26 mEq/L (ref 19–32)
Calcium: 9.2 mg/dL (ref 8.4–10.5)
Chloride: 105 mEq/L (ref 96–112)
Creatinine, Ser: 1.43 mg/dL (ref 0.40–1.50)
GFR: 51.02 mL/min — ABNORMAL LOW (ref >60.00)
Glucose, Bld: 106 mg/dL — ABNORMAL HIGH (ref 70–99)
Potassium: 4.2 mEq/L (ref 3.5–5.1)
Sodium: 138 mEq/L (ref 135–145)

## 2018-01-31 LAB — LIPID PANEL
CHOL/HDL RATIO: 3
Cholesterol: 192 mg/dL (ref 0–200)
HDL: 72.9 mg/dL (ref >39.00)
LDL Cholesterol: 108 mg/dL — ABNORMAL HIGH (ref 0–99)
NONHDL: 119.32
Triglycerides: 56 mg/dL (ref 0.0–149.0)
VLDL: 11.2 mg/dL (ref 0.0–40.0)

## 2018-01-31 LAB — HEPATIC FUNCTION PANEL
ALK PHOS: 65 U/L (ref 39–117)
ALT: 18 U/L (ref 0–53)
AST: 20 U/L (ref 0–37)
Albumin: 3.7 g/dL (ref 3.5–5.2)
BILIRUBIN DIRECT: 0.1 mg/dL (ref 0.0–0.3)
TOTAL PROTEIN: 6.6 g/dL (ref 6.0–8.3)
Total Bilirubin: 0.5 mg/dL (ref 0.2–1.2)

## 2018-02-04 ENCOUNTER — Ambulatory Visit: Payer: Medicare HMO | Admitting: Internal Medicine

## 2018-02-07 ENCOUNTER — Encounter: Payer: Self-pay | Admitting: Internal Medicine

## 2018-02-07 ENCOUNTER — Ambulatory Visit (INDEPENDENT_AMBULATORY_CARE_PROVIDER_SITE_OTHER): Payer: Medicare HMO | Admitting: Internal Medicine

## 2018-02-07 DIAGNOSIS — R972 Elevated prostate specific antigen [PSA]: Secondary | ICD-10-CM

## 2018-02-07 DIAGNOSIS — I10 Essential (primary) hypertension: Secondary | ICD-10-CM | POA: Diagnosis not present

## 2018-02-07 DIAGNOSIS — E785 Hyperlipidemia, unspecified: Secondary | ICD-10-CM

## 2018-02-07 NOTE — Assessment & Plan Note (Signed)
Norvasc

## 2018-02-07 NOTE — Assessment & Plan Note (Signed)
Livalo 

## 2018-02-07 NOTE — Progress Notes (Signed)
Subjective:  Patient ID: Sergio Hughes, male    DOB: Jul 11, 1941  Age: 77 y.o. MRN: [id number]  CC: No chief complaint on file.   HPI Sergio Hughes presents for HTN, dyslipidemia, OA, BPH/elevated PSA f/u  Outpatient Medications Prior to Visit  Medication Sig Dispense Refill  . amLODipine (NORVASC) 2.5 MG tablet Take 1 tablet (2.5 mg total) daily by mouth. 90 tablet 3  . aspirin 81 MG EC tablet Take 81 mg by mouth daily.      . Cholecalciferol (VITAMIN D) 2000 UNITS CAPS Take 1 capsule by mouth daily.      . fluticasone (FLONASE) 50 MCG/ACT nasal spray Place 2 sprays into both nostrils daily. 16 g 2  . Glucosamine-Chondroit-Vit C-Mn (GLUCOSAMINE 1500 COMPLEX) CAPS Take by mouth.    . Pitavastatin Calcium 2 MG TABS Take 1 tablet (2 mg total) daily by mouth. 30 tablet 11  . tadalafil (CIALIS) 20 MG tablet Take 20 mg by mouth every other day as needed. Reported on 02/21/2016     No facility-administered medications prior to visit.     ROS Review of Systems  Constitutional: Negative for appetite change, fatigue and unexpected weight change.  HENT: Negative for congestion, nosebleeds, sneezing, sore throat and trouble swallowing.   Eyes: Negative for itching and visual disturbance.  Respiratory: Negative for cough.   Cardiovascular: Negative for chest pain, palpitations and leg swelling.  Gastrointestinal: Negative for abdominal distention, blood in stool, diarrhea and nausea.  Genitourinary: Negative for frequency and hematuria.  Musculoskeletal: Negative for back pain, gait problem, joint swelling and neck pain.  Skin: Negative for rash.  Neurological: Negative for dizziness, tremors, speech difficulty and weakness.  Psychiatric/Behavioral: Negative for agitation, dysphoric mood and sleep disturbance. The patient is not nervous/anxious.     Objective:  BP 126/80 (BP Location: Left Arm, Patient Position: Sitting, Cuff Size: Normal)   Pulse 65   Temp 98.3 F (36.8 C) (Oral)   Ht 5'  5" (1.651 m)   Wt 156 lb (70.8 kg)   SpO2 98%   BMI 25.96 kg/m   BP Readings from Last 3 Encounters:  02/07/18 126/80  10/07/17 (!) 142/82  06/07/17 118/80    Wt Readings from Last 3 Encounters:  02/07/18 156 lb (70.8 kg)  10/07/17 154 lb (69.9 kg)  06/23/17 148 lb (67.1 kg)    Physical Exam  Constitutional: He is oriented to person, place, and time. He appears well-developed. No distress.  NAD  HENT:  Mouth/Throat: Oropharynx is clear and moist.  Eyes: Conjunctivae are normal. Pupils are equal, round, and reactive to light.  Neck: Normal range of motion. No JVD present. No thyromegaly present.  Cardiovascular: Normal rate, regular rhythm, normal heart sounds and intact distal pulses. Exam reveals no gallop and no friction rub.  No murmur heard. Pulmonary/Chest: Effort normal and breath sounds normal. No respiratory distress. He has no wheezes. He has no rales. He exhibits no tenderness.  Abdominal: Soft. Bowel sounds are normal. He exhibits no distension and no mass. There is no tenderness. There is no rebound and no guarding.  Musculoskeletal: Normal range of motion. He exhibits no edema or tenderness.  Lymphadenopathy:    He has no cervical adenopathy.  Neurological: He is alert and oriented to person, place, and time. He has normal reflexes. No cranial nerve deficit. He exhibits normal muscle tone. He displays a negative Romberg sign. Coordination and gait normal.  Skin: Skin is warm and dry. No rash noted.  Psychiatric:  He has a normal mood and affect. His behavior is normal. Judgment and thought content normal.    Lab Results  Component Value Date   WBC 8.8 01/22/2017   HGB 13.8 01/22/2017   HCT 41.0 01/22/2017   PLT 237.0 01/22/2017   GLUCOSE 106 (H) 01/31/2018   CHOL 192 01/31/2018   TRIG 56.0 01/31/2018   HDL 72.90 01/31/2018   LDLDIRECT 144.9 11/14/2013   LDLCALC 108 (H) 01/31/2018   ALT 18 01/31/2018   AST 20 01/31/2018   NA 138 01/31/2018   K 4.2  01/31/2018   CL 105 01/31/2018   CREATININE 1.43 01/31/2018   BUN 31 (H) 01/31/2018   CO2 26 01/31/2018   TSH 0.56 01/22/2017   PSA 6.35 (H) 09/29/2017   INR 1.02 12/09/2015   HGBA1C 5.8 (H) 12/09/2015    No results found.  Assessment & Plan:   There are no diagnoses linked to this encounter. I am having Sergio Hughes "[name]" maintain his aspirin, tadalafil, Vitamin D, GLUCOSAMINE 1500 COMPLEX, fluticasone, Pitavastatin Calcium, and amLODipine.  No orders of the defined types were placed in this encounter.    Follow-up: No Follow-up on file.  Walker Kehr, MD

## 2018-02-07 NOTE — Assessment & Plan Note (Signed)
F/u w/Dr Alyson Ingles for PSA>7

## 2018-03-07 ENCOUNTER — Encounter: Payer: Self-pay | Admitting: Gastroenterology

## 2018-03-24 DIAGNOSIS — R972 Elevated prostate specific antigen [PSA]: Secondary | ICD-10-CM | POA: Diagnosis not present

## 2018-03-30 ENCOUNTER — Ambulatory Visit: Payer: Medicare HMO | Admitting: Urology

## 2018-03-30 DIAGNOSIS — R972 Elevated prostate specific antigen [PSA]: Secondary | ICD-10-CM | POA: Diagnosis not present

## 2018-03-30 DIAGNOSIS — R351 Nocturia: Secondary | ICD-10-CM | POA: Diagnosis not present

## 2018-05-04 DIAGNOSIS — H25013 Cortical age-related cataract, bilateral: Secondary | ICD-10-CM | POA: Diagnosis not present

## 2018-05-04 DIAGNOSIS — H52202 Unspecified astigmatism, left eye: Secondary | ICD-10-CM | POA: Diagnosis not present

## 2018-05-04 DIAGNOSIS — H2513 Age-related nuclear cataract, bilateral: Secondary | ICD-10-CM | POA: Diagnosis not present

## 2018-05-04 DIAGNOSIS — H5213 Myopia, bilateral: Secondary | ICD-10-CM | POA: Diagnosis not present

## 2018-05-16 ENCOUNTER — Ambulatory Visit: Payer: Medicare HMO | Admitting: Gastroenterology

## 2018-05-16 ENCOUNTER — Encounter: Payer: Self-pay | Admitting: Gastroenterology

## 2018-05-16 VITALS — BP 130/68 | HR 65 | Ht 65.0 in | Wt 149.0 lb

## 2018-05-16 DIAGNOSIS — D374 Neoplasm of uncertain behavior of colon: Secondary | ICD-10-CM

## 2018-05-16 NOTE — Progress Notes (Signed)
Review of pertinent gastrointestinal problems: 1. Precancerous colon polyps: recurrent cecal polyp since at least 2002; multiple colonoscopies over many years Dr. Verl Blalock showing TVA at appendiceal orifice, sometimes with HGD;  Dr. Sharlett Iles colonoscopy 2013 TA, restected. Dr. Ardis Hughs colonoscopy 03/2015 found recurrent polyp at the same site and he was referred for surgical resection.  He underwent partial cecectomy, appendectomy 2016, Dr. Judeth Horn, pathology showed tubulovillous adenoma 2.5 cm, no high-grade dysplasia.   HPI: This is a very pleasant 77 year old man whom I last saw the time of a colonoscopy about 3 years ago.  He will be turning 77 next month.  He had recurrent polyp in his cecum at the appendiceal orifice that had been worked on for many years by 1 of my previous partners Dr. Verl Blalock.  In 2016 I found the same polyp, recurrent versus residual.  We discussed that and I referred him for limited seek ectomy, appendectomy in 2016 with Dr. Sharrell Ku.  Surgery went very well.  he spent.    He has had no changes in his bowels, no overt bleeding, no significant abdominal pains 2 nights in the hospital    Chief complaint is history of adenomatous colon polyps  ROS: complete GI ROS as described in HPI, all other review negative.  Constitutional:  No unintentional weight loss   Past Medical History:  Diagnosis Date  . Hyperlipidemia   . Hypertension   . Osteoarthritis     Past Surgical History:  Procedure Laterality Date  . COLONOSCOPY    . INGUINAL HERNIA REPAIR  2005   Right  . PARTIAL COLECTOMY Right 12/16/2015   Procedure: PARTIAL CECECTOMY AND APPENDECTOMY;  Surgeon: Judeth Horn, MD;  Location: [facility];  Service: General;  Laterality: Right;  . POLYPECTOMY    . VASECTOMY      Current Outpatient Medications  Medication Sig Dispense Refill  . amLODipine (NORVASC) 2.5 MG tablet Take 1 tablet (2.5 mg total) daily by mouth. 90 tablet 3  . aspirin 81 MG  EC tablet Take 81 mg by mouth daily.      . Cholecalciferol (VITAMIN D) 2000 UNITS CAPS Take 1 capsule by mouth daily.      . Glucosamine-Chondroit-Vit C-Mn (GLUCOSAMINE 1500 COMPLEX) CAPS Take by mouth.    . Pitavastatin Calcium 2 MG TABS Take 1 tablet (2 mg total) daily by mouth. 30 tablet 11   No current facility-administered medications for this visit.     Allergies as of 05/16/2018 - Review Complete 05/16/2018  Allergen Reaction Noted  . Bupropion hcl  12/13/2008  . Lovastatin  05/12/2013    Family History  Problem Relation Age of Onset  . Hypertension Father   . Cancer Father        brain ca  . Prostate cancer Brother 57  . Colon cancer Neg Hx   . Rectal cancer Neg Hx   . Stomach cancer Neg Hx   . Esophageal cancer Neg Hx     Social History   Socioeconomic History  . Marital status: Married    Spouse name: Press Casale  . Number of children: Not on file  . Years of education: college   . Highest education level: Not on file  Occupational History  . Occupation: retired  Scientific laboratory technician  . Financial resource strain: Not on file  . Food insecurity:    Worry: Not on file    Inability: Not on file  . Transportation needs:    Medical: Not on file  Non-medical: Not on file  Tobacco Use  . Smoking status: Former Smoker    Types: Cigarettes    Last attempt to quit: 12/14/1965    Years since quitting: 52.4  . Smokeless tobacco: Never Used  Substance and Sexual Activity  . Alcohol use: Yes    Alcohol/week: 12.6 oz    Types: 7 Glasses of wine, 14 Standard drinks or equivalent per week    Comment: 1-2 glasses of wine or beer each night  . Drug use: No  . Sexual activity: Not Currently  Lifestyle  . Physical activity:    Days per week: Not on file    Minutes per session: Not on file  . Stress: Not on file  Relationships  . Social connections:    Talks on phone: Not on file    Gets together: Not on file    Attends religious service: Not on file    Active member  of club or organization: Not on file    Attends meetings of clubs or organizations: Not on file    Relationship status: Not on file  . Intimate partner violence:    Fear of current or ex partner: Not on file    Emotionally abused: Not on file    Physically abused: Not on file    Forced sexual activity: Not on file  Other Topics Concern  . Not on file  Social History Narrative   Regular exercise - YES - golf     Physical Exam: Ht 5\' 5"  (1.651 m)   Wt 149 lb (67.6 kg)   BMI 24.79 kg/m  Constitutional: generally well-appearing Psychiatric: alert and oriented x3 Abdomen: soft, nontender, nondistended, no obvious ascites, no peritoneal signs, normal bowel sounds No peripheral edema noted in lower extremities  Assessment and plan: 77 y.o. male with history of adenomatous colon polyps  We discussed screening and surveillance recommendations.  He will be 77 next month and feels perfectly well currently.  He has no family history of colon cancer.  He understands that colon cancer screening and polyp surveillance and generally  stops around the age 61-80 for most people.  He prefers not to have any further colonoscopies unless absolutely necessary.  He does understand that if he starts to have symptoms we specifically discussed bleeding, significant abdominal pains, changes in his bowels he will call but for now I agree that it is perfectly fine for no further surveillance procedures.  Please see the "Patient Instructions" section for addition details about the plan.  Owens Loffler, MD [facility] 05/16/2018, 8:42 AM

## 2018-05-16 NOTE — Patient Instructions (Addendum)
No need for further colonoscopies for colon cancer screening or polyp surveillance.  Normal BMI (Body Mass Index- based on height and weight) is between 23 and 30. Your BMI today is Body mass index is 24.79 kg/m. Marland Kitchen Please consider follow up  regarding your BMI with your Primary Care Provider.

## 2018-07-01 ENCOUNTER — Other Ambulatory Visit: Payer: Self-pay | Admitting: Internal Medicine

## 2018-07-12 ENCOUNTER — Other Ambulatory Visit: Payer: Self-pay

## 2018-07-12 ENCOUNTER — Encounter (HOSPITAL_COMMUNITY): Payer: Self-pay

## 2018-07-12 ENCOUNTER — Emergency Department (HOSPITAL_COMMUNITY)
Admission: EM | Admit: 2018-07-12 | Discharge: 2018-07-12 | Disposition: A | Discharge status: Home / Self Care | Payer: Medicare HMO | Attending: Emergency Medicine | Admitting: Emergency Medicine

## 2018-07-12 DIAGNOSIS — I10 Essential (primary) hypertension: Secondary | ICD-10-CM | POA: Insufficient documentation

## 2018-07-12 DIAGNOSIS — F17210 Nicotine dependence, cigarettes, uncomplicated: Secondary | ICD-10-CM | POA: Diagnosis not present

## 2018-07-12 DIAGNOSIS — M79602 Pain in left arm: Secondary | ICD-10-CM | POA: Insufficient documentation

## 2018-07-12 DIAGNOSIS — I4892 Unspecified atrial flutter: Secondary | ICD-10-CM | POA: Diagnosis not present

## 2018-07-12 LAB — I-STAT TROPONIN, ED
TROPONIN I, POC: 0 ng/mL (ref 0.00–0.08)
Troponin i, poc: 0 ng/mL (ref 0.00–0.08)

## 2018-07-12 LAB — CBC
HEMATOCRIT: 43.4 % (ref 39.0–52.0)
Hemoglobin: 14.7 g/dL (ref 13.0–17.0)
MCH: 31.5 pg (ref 26.0–34.0)
MCHC: 33.9 g/dL (ref 30.0–36.0)
MCV: 92.9 fL (ref 78.0–100.0)
Platelets: 186 10*3/uL (ref 150–400)
RBC: 4.67 MIL/uL (ref 4.22–5.81)
RDW: 12.8 % (ref 11.5–15.5)
WBC: 7.6 10*3/uL (ref 4.0–10.5)

## 2018-07-12 LAB — BASIC METABOLIC PANEL
ANION GAP: 8 (ref 5–15)
BUN: 26 mg/dL — ABNORMAL HIGH (ref 8–23)
CO2: 27 mmol/L (ref 22–32)
Calcium: 9.1 mg/dL (ref 8.9–10.3)
Chloride: 105 mmol/L (ref 98–111)
Creatinine, Ser: 1.69 mg/dL — ABNORMAL HIGH (ref 0.61–1.24)
GFR calc Af Amer: 43 mL/min — ABNORMAL LOW (ref >60)
GFR calc non Af Amer: 37 mL/min — ABNORMAL LOW (ref >60)
GLUCOSE: 105 mg/dL — AB (ref 70–99)
POTASSIUM: 3.8 mmol/L (ref 3.5–5.1)
Sodium: 140 mmol/L (ref 135–145)

## 2018-07-12 NOTE — ED Triage Notes (Signed)
Pt c/o left arm pain and hypertension since around 0600.  Denies any chest pain.

## 2018-07-12 NOTE — ED Provider Notes (Signed)
[facility] Emergency Department Provider Note MRN:  [id number]  Arrival date & time: 07/12/18     Chief Complaint   Hypertension and Arm Pain   History of Present Illness   Sergio Hughes is a 77 y.o. year-old male with a history of hypertension, hyperlipidemia presenting to the ED with chief complaint of arm pain.  Pain is located in the left shoulder and left upper arm.  Pain began suddenly at 5:45 AM.  Patient was awake and sitting at rest, no trauma to the arm.  Described as a dull pressure-like pain that was constant for 1 hour and then resolved.  Patient denies any other symptoms associated with the pain, no chest pain, no shortness of breath, no dizziness or sweatiness, no nausea or vomiting.  Checked his blood pressure during the pain, found systolic blood pressures in the 150s to 160s, which was concerning to him.  No recent fever cough, no abdominal pain, no dysuria.  Review of Systems  A complete 10 system review of systems was obtained and all systems are negative except as noted in the HPI and PMH.   Patient's Health History    Past Medical History:  Diagnosis Date  . Hyperlipidemia   . Hypertension   . Osteoarthritis     Past Surgical History:  Procedure Laterality Date  . COLONOSCOPY    . INGUINAL HERNIA REPAIR  2005   Right  . PARTIAL COLECTOMY Right 12/16/2015   Procedure: PARTIAL CECECTOMY AND APPENDECTOMY;  Surgeon: Judeth Horn, MD;  Location: [facility];  Service: General;  Laterality: Right;  . POLYPECTOMY    . VASECTOMY      Family History  Problem Relation Age of Onset  . Hypertension Father   . Cancer Father        brain ca  . Prostate cancer Brother 67  . Colon cancer Neg Hx   . Rectal cancer Neg Hx   . Stomach cancer Neg Hx   . Esophageal cancer Neg Hx     Social History   Socioeconomic History  . Marital status: Married    Spouse name: Sergio Hughes  . Number of children: Not on file  . Years of education: college   . Highest  education level: Not on file  Occupational History  . Occupation: retired  Scientific laboratory technician  . Financial resource strain: Not on file  . Food insecurity:    Worry: Not on file    Inability: Not on file  . Transportation needs:    Medical: Not on file    Non-medical: Not on file  Tobacco Use  . Smoking status: Former Smoker    Types: Cigarettes    Last attempt to quit: 12/14/1965    Years since quitting: 52.6  . Smokeless tobacco: Never Used  Substance and Sexual Activity  . Alcohol use: Yes    Alcohol/week: 21.0 standard drinks    Types: 7 Glasses of wine, 14 Standard drinks or equivalent per week    Comment: 1-2 glasses of wine or beer each night  . Drug use: No  . Sexual activity: Not Currently  Lifestyle  . Physical activity:    Days per week: Not on file    Minutes per session: Not on file  . Stress: Not on file  Relationships  . Social connections:    Talks on phone: Not on file    Gets together: Not on file    Attends religious service: Not on file  Active member of club or organization: Not on file    Attends meetings of clubs or organizations: Not on file    Relationship status: Not on file  . Intimate partner violence:    Fear of current or ex partner: Not on file    Emotionally abused: Not on file    Physically abused: Not on file    Forced sexual activity: Not on file  Other Topics Concern  . Not on file  Social History Narrative   Regular exercise - YES - golf     Physical Exam  Vital Signs and Nursing Notes reviewed Vitals:   07/12/18 0900 07/12/18 1030  BP: 134/88 126/77  Pulse: (!) 59 71  Resp: 17 17  Temp:    SpO2: 98% 97%    CONSTITUTIONAL: Well-appearing, NAD NEURO:  Alert and oriented x 3, no focal deficits EYES:  eyes equal and reactive ENT/NECK:  no LAD, no JVD CARDIO: Regular rate, well-perfused, normal S1 and S2 PULM:  CTAB no wheezing or rhonchi GI/GU:  normal bowel sounds, non-distended, non-tender MSK/SPINE:  No gross  deformities, no edema SKIN:  no rash, atraumatic PSYCH:  Appropriate speech and behavior  Diagnostic and Interventional Summary    EKG Interpretation  Date/Time:  Tuesday July 12 2018 07:17:46 EDT Ventricular Rate:  68 PR Interval:    QRS Duration: 161 QT Interval:  449 QTC Calculation: 478 R Axis:   -36 Text Interpretation:  Atrial flutter with predominant 4:1 AV block Right bundle branch block Confirmed by Gerlene Fee [phone]) on 07/12/2018 7:20:44 AM      Labs Reviewed  BASIC METABOLIC PANEL - Abnormal; Notable for the following components:      Result Value   Glucose, Bld 105 (*)    BUN 26 (*)    Creatinine, Ser 1.69 (*)    GFR calc non Af Amer 37 (*)    GFR calc Af Amer 43 (*)    All other components within normal limits  CBC  I-STAT TROPONIN, ED  I-STAT TROPONIN, ED    No orders to display    Medications - No data to display   Procedures Critical Care  ED Course and Medical Decision Making  I have reviewed the triage vital signs and the nursing notes.  Pertinent labs & imaging results that were available during my care of the patient were reviewed by me and considered in my medical decision making (see below for details). Clinical Course as of Jul 12 1204  Tue Jul 13, 6851  3363 77 year old male history of hypertension, hyperlipidemia here with isolated left arm pain, nontraumatic, dull pressure pain lasting 1 hour, now resolved.  No chest pain, no associated symptoms, would be a very atypical presentation of ACS.  However given age and risk factors, will evaluate with 2 troponins today.   [MB]    Clinical Course User Index [MB] Maudie Flakes, MD    2 troponins negative, no evidence of heart damage today, will follow-up with PCP.  After the discussed management above, the patient was determined to be safe for discharge.  The patient was in agreement with this plan and all questions regarding their care were answered.  ED return precautions were discussed  and the patient will return to the ED with any significant worsening of condition.  Barth Kirks. Sedonia Small, [place] [email or web address]  Final Clinical Impressions(s) / ED Diagnoses     ICD-10-CM   1. Left arm pain M79.602  ED Discharge Orders    None         Maudie Flakes, MD 07/12/18 1206

## 2018-07-12 NOTE — Discharge Instructions (Addendum)
You were evaluated in the Emergency Department and after careful evaluation, we did not find any emergent condition requiring admission or further testing in the hospital.  We found no evidence of heart damage today.  We recommend following up with your primary care provider to discuss the need for cardiology follow-up.  Please return to the Emergency Department if you experience any worsening of your condition.  We encourage you to follow up with a primary care provider.  Thank you for allowing [place] to be a part of your care.

## 2018-07-20 DIAGNOSIS — R972 Elevated prostate specific antigen [PSA]: Secondary | ICD-10-CM | POA: Diagnosis not present

## 2018-07-27 ENCOUNTER — Ambulatory Visit: Payer: Medicare HMO | Admitting: Urology

## 2018-07-27 DIAGNOSIS — R351 Nocturia: Secondary | ICD-10-CM | POA: Diagnosis not present

## 2018-07-27 DIAGNOSIS — R972 Elevated prostate specific antigen [PSA]: Secondary | ICD-10-CM

## 2018-08-01 ENCOUNTER — Other Ambulatory Visit: Payer: Self-pay | Admitting: Internal Medicine

## 2018-08-01 DIAGNOSIS — R7989 Other specified abnormal findings of blood chemistry: Secondary | ICD-10-CM

## 2018-08-04 ENCOUNTER — Other Ambulatory Visit (INDEPENDENT_AMBULATORY_CARE_PROVIDER_SITE_OTHER): Payer: Medicare HMO

## 2018-08-04 DIAGNOSIS — R7989 Other specified abnormal findings of blood chemistry: Secondary | ICD-10-CM

## 2018-08-04 LAB — BASIC METABOLIC PANEL
BUN: 25 mg/dL — AB (ref 6–23)
CALCIUM: 9.3 mg/dL (ref 8.4–10.5)
CO2: 26 mEq/L (ref 19–32)
Chloride: 103 mEq/L (ref 96–112)
Creatinine, Ser: 1.63 mg/dL — ABNORMAL HIGH (ref 0.40–1.50)
GFR: 43.8 mL/min — AB (ref >60.00)
Glucose, Bld: 99 mg/dL (ref 70–99)
POTASSIUM: 4.8 meq/L (ref 3.5–5.1)
SODIUM: 138 meq/L (ref 135–145)

## 2018-08-10 ENCOUNTER — Ambulatory Visit (INDEPENDENT_AMBULATORY_CARE_PROVIDER_SITE_OTHER): Payer: Medicare HMO | Admitting: Internal Medicine

## 2018-08-10 ENCOUNTER — Encounter: Payer: Self-pay | Admitting: Internal Medicine

## 2018-08-10 VITALS — BP 132/82 | HR 74 | Temp 98.0°F | Resp 16 | Wt 146.0 lb

## 2018-08-10 DIAGNOSIS — I10 Essential (primary) hypertension: Secondary | ICD-10-CM | POA: Diagnosis not present

## 2018-08-10 DIAGNOSIS — R972 Elevated prostate specific antigen [PSA]: Secondary | ICD-10-CM | POA: Diagnosis not present

## 2018-08-10 DIAGNOSIS — N183 Chronic kidney disease, stage 3 unspecified: Secondary | ICD-10-CM

## 2018-08-10 DIAGNOSIS — N189 Chronic kidney disease, unspecified: Secondary | ICD-10-CM | POA: Insufficient documentation

## 2018-08-10 DIAGNOSIS — E785 Hyperlipidemia, unspecified: Secondary | ICD-10-CM

## 2018-08-10 DIAGNOSIS — N184 Chronic kidney disease, stage 4 (severe): Secondary | ICD-10-CM | POA: Insufficient documentation

## 2018-08-10 DIAGNOSIS — Z23 Encounter for immunization: Secondary | ICD-10-CM

## 2018-08-10 NOTE — Assessment & Plan Note (Signed)
F/u w/Urology 

## 2018-08-10 NOTE — Assessment & Plan Note (Signed)
Livalo po

## 2018-08-10 NOTE — Progress Notes (Signed)
Subjective:  Patient ID: Sergio Hughes, male    DOB: August 14, 1941  Age: 77 y.o. MRN: [id number]  CC: No chief complaint on file.   HPI SAUD BAIL presents for HTN, dyslipidemia, CRI f/u  Outpatient Medications Prior to Visit  Medication Sig Dispense Refill  . amLODipine (NORVASC) 2.5 MG tablet TAKE 1 TABLET (2.5 MG TOTAL) DAILY BY MOUTH. 90 tablet 3  . aspirin 81 MG EC tablet Take 81 mg by mouth daily.      . Cholecalciferol (VITAMIN D) 2000 UNITS CAPS Take 1 capsule by mouth daily.      . Glucosamine-Chondroit-Vit C-Mn (GLUCOSAMINE 1500 COMPLEX) CAPS Take by mouth.    . Pitavastatin Calcium 2 MG TABS Take 1 tablet (2 mg total) daily by mouth. 30 tablet 11   No facility-administered medications prior to visit.     ROS: Review of Systems  Constitutional: Negative for appetite change, fatigue and unexpected weight change.  HENT: Negative for congestion, nosebleeds, sneezing, sore throat and trouble swallowing.   Eyes: Negative for itching and visual disturbance.  Respiratory: Negative for cough.   Cardiovascular: Negative for chest pain, palpitations and leg swelling.  Gastrointestinal: Negative for abdominal distention, blood in stool, diarrhea and nausea.  Genitourinary: Negative for frequency and hematuria.  Musculoskeletal: Negative for back pain, gait problem, joint swelling and neck pain.  Skin: Negative for rash.  Neurological: Negative for dizziness, tremors, speech difficulty and weakness.  Psychiatric/Behavioral: Negative for agitation, dysphoric mood and sleep disturbance. The patient is not nervous/anxious.     Objective:  BP 132/82   Pulse 74   Temp 98 F (36.7 C) (Oral)   Resp 16   Wt 146 lb (66.2 kg)   SpO2 98%   BMI 24.30 kg/m   BP Readings from Last 3 Encounters:  08/10/18 132/82  07/12/18 126/77  05/16/18 130/68    Wt Readings from Last 3 Encounters:  08/10/18 146 lb (66.2 kg)  07/12/18 141 lb (64 kg)  05/16/18 149 lb (67.6 kg)    Physical  Exam  Constitutional: He is oriented to person, place, and time. He appears well-developed. No distress.  NAD  HENT:  Mouth/Throat: Oropharynx is clear and moist.  Eyes: Pupils are equal, round, and reactive to light. Conjunctivae are normal.  Neck: Normal range of motion. No JVD present. No thyromegaly present.  Cardiovascular: Normal rate, regular rhythm, normal heart sounds and intact distal pulses. Exam reveals no gallop and no friction rub.  No murmur heard. Pulmonary/Chest: Effort normal and breath sounds normal. No respiratory distress. He has no wheezes. He has no rales. He exhibits no tenderness.  Abdominal: Soft. Bowel sounds are normal. He exhibits no distension and no mass. There is no tenderness. There is no rebound and no guarding.  Musculoskeletal: Normal range of motion. He exhibits no edema or tenderness.  Lymphadenopathy:    He has no cervical adenopathy.  Neurological: He is alert and oriented to person, place, and time. He has normal reflexes. No cranial nerve deficit. He exhibits normal muscle tone. He displays a negative Romberg sign. Coordination and gait normal.  Skin: Skin is warm and dry. No rash noted.  Psychiatric: He has a normal mood and affect. His behavior is normal. Judgment and thought content normal.    Lab Results  Component Value Date   WBC 7.6 07/12/2018   HGB 14.7 07/12/2018   HCT 43.4 07/12/2018   PLT 186 07/12/2018   GLUCOSE 99 08/04/2018   CHOL 192 01/31/2018  TRIG 56.0 01/31/2018   HDL 72.90 01/31/2018   LDLDIRECT 144.9 11/14/2013   LDLCALC 108 (H) 01/31/2018   ALT 18 01/31/2018   AST 20 01/31/2018   NA 138 08/04/2018   K 4.8 08/04/2018   CL 103 08/04/2018   CREATININE 1.63 (H) 08/04/2018   BUN 25 (H) 08/04/2018   CO2 26 08/04/2018   TSH 0.56 01/22/2017   PSA 6.35 (H) 09/29/2017   INR 1.02 12/09/2015   HGBA1C 5.8 (H) 12/09/2015    No results found.  Assessment & Plan:   Diagnoses and all orders for this visit:  Need for  influenza vaccination -     Flu vaccine HIGH DOSE PF (Fluzone High Dose)     No orders of the defined types were placed in this encounter.    Follow-up: No follow-ups on file.  Walker Kehr, MD

## 2018-08-10 NOTE — Assessment & Plan Note (Signed)
Avoid NSAIDs Hydrate well

## 2018-08-10 NOTE — Assessment & Plan Note (Signed)
Norvasc

## 2018-08-10 NOTE — Patient Instructions (Signed)
Seborrheic Keratosis  Seborrheic keratosis is a common, noncancerous (benign) skin growth. This condition causes waxy, rough, tan, brown, or black spots to appear on the skin. These skin growths can be flat or raised.  What are the causes?  The cause of this condition is not known.  What increases the risk?  This condition is more likely to develop in:  · People who have a family history of seborrheic keratosis.  · People who are 50 or older.  · People who are pregnant.  · People who have had estrogen replacement therapy.    What are the signs or symptoms?  This condition often occurs on the face, chest, shoulders, back, or other areas. These growths:  · Are usually painless, but may become irritated and itchy.  · Can be yellow, brown, black, or other colors.  · Are slightly raised or have a flat surface.  · Are sometimes rough or wart-like in texture.  · Are often waxy on the surface.  · Are round or oval-shaped.  · Sometimes look like they are "stuck on.”  · Often occur in groups, but may occur as a single growth.    How is this diagnosed?  This condition is diagnosed with a medical history and physical exam. A sample of the growth may be tested (skin biopsy). You may need to see a skin specialist (dermatologist).  How is this treated?  Treatment is not usually needed for this condition, unless the growths are irritated or are often bleeding. You may also choose to have the growths removed if you do not like their appearance. Most commonly, these growths are treated with a procedure in which liquid nitrogen is applied to “freeze” off the growth (cryosurgery). They may also be burned off with electricity or cut off.  Follow these instructions at home:  · Watch your growth for any changes.  · Keep all follow-up visits as told by your health care provider. This is important.  · Do not scratch or pick at the growth or growths. This can cause them to become irritated or infected.  Contact a health care provider  if:  · You suddenly have many new growths.  · Your growth bleeds, itches, or hurts.  · Your growth suddenly becomes larger or changes color.  This information is not intended to replace advice given to you by your health care provider. Make sure you discuss any questions you have with your health care provider.  Document Released: 12/12/2010 Document Revised: 04/16/2016 Document Reviewed: 03/27/2015  Elsevier Interactive Patient Education © 2018 [employer]

## 2019-02-09 ENCOUNTER — Other Ambulatory Visit: Payer: Self-pay

## 2019-02-09 ENCOUNTER — Other Ambulatory Visit (INDEPENDENT_AMBULATORY_CARE_PROVIDER_SITE_OTHER): Payer: Medicare HMO

## 2019-02-09 ENCOUNTER — Encounter: Payer: Self-pay | Admitting: Internal Medicine

## 2019-02-09 ENCOUNTER — Ambulatory Visit (INDEPENDENT_AMBULATORY_CARE_PROVIDER_SITE_OTHER): Payer: Medicare HMO | Admitting: Internal Medicine

## 2019-02-09 VITALS — BP 134/84 | HR 63 | Temp 97.7°F | Ht 65.0 in | Wt 150.0 lb

## 2019-02-09 DIAGNOSIS — N183 Chronic kidney disease, stage 3 unspecified: Secondary | ICD-10-CM

## 2019-02-09 DIAGNOSIS — Z0000 Encounter for general adult medical examination without abnormal findings: Secondary | ICD-10-CM

## 2019-02-09 DIAGNOSIS — E785 Hyperlipidemia, unspecified: Secondary | ICD-10-CM

## 2019-02-09 DIAGNOSIS — I10 Essential (primary) hypertension: Secondary | ICD-10-CM

## 2019-02-09 DIAGNOSIS — N2889 Other specified disorders of kidney and ureter: Secondary | ICD-10-CM

## 2019-02-09 DIAGNOSIS — M150 Primary generalized (osteo)arthritis: Secondary | ICD-10-CM

## 2019-02-09 DIAGNOSIS — R972 Elevated prostate specific antigen [PSA]: Secondary | ICD-10-CM

## 2019-02-09 DIAGNOSIS — M159 Polyosteoarthritis, unspecified: Secondary | ICD-10-CM

## 2019-02-09 LAB — CBC WITH DIFFERENTIAL/PLATELET
BASOS ABS: 0.1 10*3/uL (ref 0.0–0.1)
Basophils Relative: 1.5 % (ref 0.0–3.0)
EOS ABS: 0.1 10*3/uL (ref 0.0–0.7)
Eosinophils Relative: 1.4 % (ref 0.0–5.0)
HCT: 45.2 % (ref 39.0–52.0)
Hemoglobin: 15.4 g/dL (ref 13.0–17.0)
LYMPHS ABS: 1.8 10*3/uL (ref 0.7–4.0)
LYMPHS PCT: 18.8 % (ref 12.0–46.0)
MCHC: 34 g/dL (ref 30.0–36.0)
MCV: 91.8 fl (ref 78.0–100.0)
MONO ABS: 0.8 10*3/uL (ref 0.1–1.0)
Monocytes Relative: 8.8 % (ref 3.0–12.0)
NEUTROS ABS: 6.6 10*3/uL (ref 1.4–7.7)
Neutrophils Relative %: 69.5 % (ref 43.0–77.0)
PLATELETS: 208 10*3/uL (ref 150.0–400.0)
RBC: 4.93 Mil/uL (ref 4.22–5.81)
RDW: 13.9 % (ref 11.5–15.5)
WBC: 9.5 10*3/uL (ref 4.0–10.5)

## 2019-02-09 LAB — BASIC METABOLIC PANEL
BUN: 32 mg/dL — ABNORMAL HIGH (ref 6–23)
CALCIUM: 9.7 mg/dL (ref 8.4–10.5)
CO2: 26 mEq/L (ref 19–32)
CREATININE: 1.65 mg/dL — AB (ref 0.40–1.50)
Chloride: 106 mEq/L (ref 96–112)
GFR: 40.58 mL/min — ABNORMAL LOW (ref >60.00)
Glucose, Bld: 99 mg/dL (ref 70–99)
Potassium: 5 mEq/L (ref 3.5–5.1)
SODIUM: 140 meq/L (ref 135–145)

## 2019-02-09 LAB — LIPID PANEL
CHOLESTEROL: 231 mg/dL — AB (ref 0–200)
HDL: 79.3 mg/dL (ref >39.00)
LDL Cholesterol: 141 mg/dL — ABNORMAL HIGH (ref 0–99)
NonHDL: 151.35
TRIGLYCERIDES: 53 mg/dL (ref 0.0–149.0)
Total CHOL/HDL Ratio: 3
VLDL: 10.6 mg/dL (ref 0.0–40.0)

## 2019-02-09 LAB — HEPATIC FUNCTION PANEL
ALBUMIN: 4.2 g/dL (ref 3.5–5.2)
ALK PHOS: 79 U/L (ref 39–117)
ALT: 20 U/L (ref 0–53)
AST: 23 U/L (ref 0–37)
Bilirubin, Direct: 0.1 mg/dL (ref 0.0–0.3)
Total Bilirubin: 0.7 mg/dL (ref 0.2–1.2)
Total Protein: 7.4 g/dL (ref 6.0–8.3)

## 2019-02-09 LAB — TSH: TSH: 0.46 u[IU]/mL (ref 0.35–4.50)

## 2019-02-09 NOTE — Assessment & Plan Note (Signed)
BMET Hydration

## 2019-02-09 NOTE — Assessment & Plan Note (Signed)
B hands OA, knees Ibuprofen 600 mg prn Rx

## 2019-02-09 NOTE — Assessment & Plan Note (Signed)
Norvasc

## 2019-02-09 NOTE — Patient Instructions (Signed)

## 2019-02-09 NOTE — Assessment & Plan Note (Signed)
   Here for medicare wellness/physical  Diet: heart healthy  Physical activity: not sedentary  Depression/mood screen: negative  Hearing: intact to whispered voice  Visual acuity: grossly normal w/glasses, performs annual eye exam  ADLs: capable  Fall risk: low to none  Home safety: good  Cognitive evaluation: intact to orientation, naming, recall and repetition  EOL planning: adv directives, full code/ I agree  I have personally reviewed and have noted  1. The patient's medical, surgical and social history  2. Their use of alcohol, tobacco or illicit drugs  3. Their current medications and supplements  4. The patient's functional ability including ADL's, fall risks, home safety risks and hearing or visual impairment.  5. Diet and physical activities  6. Evidence for depression or mood disorders 7. The roster of all physicians providing medical care to patient - is listed in the Snapshot section of the chart and reviewed today.    Today patient counseled on age appropriate routine health concerns for screening and prevention, each reviewed and up to date or declined. Immunizations reviewed and up to date or declined. Labs ordered and reviewed. Risk factors for depression reviewed and negative. Hearing function and visual acuity are intact. ADLs screened and addressed as needed. Functional ability and level of safety reviewed and appropriate. Education, counseling and referrals performed based on assessed risks today. Patient provided with a copy of personalized plan for preventive services.  Colon - only if issues CT ca score test offered 3/20

## 2019-02-09 NOTE — Assessment & Plan Note (Signed)
PSA w/Urology 

## 2019-02-09 NOTE — Progress Notes (Signed)
Subjective:  Patient ID: Sergio Hughes, male    DOB: Mar 25, 1941  Age: 78 y.o. MRN: [id number]  CC: No chief complaint on file.   HPI Sergio Hughes presents for HTN, dyslipidemia, OA f/u Well exam  Outpatient Medications Prior to Visit  Medication Sig Dispense Refill  . amLODipine (NORVASC) 2.5 MG tablet TAKE 1 TABLET (2.5 MG TOTAL) DAILY BY MOUTH. 90 tablet 3  . aspirin 81 MG EC tablet Take 81 mg by mouth daily.      . Cholecalciferol (VITAMIN D) 2000 UNITS CAPS Take 1 capsule by mouth daily.      . Glucosamine-Chondroit-Vit C-Mn (GLUCOSAMINE 1500 COMPLEX) CAPS Take by mouth.    . Pitavastatin Calcium 2 MG TABS Take 1 tablet (2 mg total) daily by mouth. 30 tablet 11   No facility-administered medications prior to visit.     ROS: Review of Systems  Constitutional: Negative for appetite change, fatigue and unexpected weight change.  HENT: Negative for congestion, nosebleeds, sneezing, sore throat and trouble swallowing.   Eyes: Negative for itching and visual disturbance.  Respiratory: Negative for cough.   Cardiovascular: Negative for chest pain, palpitations and leg swelling.  Gastrointestinal: Negative for abdominal distention, blood in stool, diarrhea and nausea.  Genitourinary: Negative for frequency and hematuria.  Musculoskeletal: Positive for arthralgias. Negative for back pain, gait problem, joint swelling and neck pain.  Skin: Negative for rash.  Neurological: Negative for dizziness, tremors, speech difficulty and weakness.  Psychiatric/Behavioral: Negative for agitation, dysphoric mood, sleep disturbance and suicidal ideas. The patient is not nervous/anxious.     Objective:  BP 134/84 (BP Location: Left Arm, Patient Position: Sitting, Cuff Size: Normal)   Pulse 63   Temp 97.7 F (36.5 C) (Oral)   Ht 5\' 5"  (1.651 m)   Wt 150 lb (68 kg)   SpO2 97%   BMI 24.96 kg/m   BP Readings from Last 3 Encounters:  02/09/19 134/84  08/10/18 132/82  07/12/18 126/77    Wt  Readings from Last 3 Encounters:  02/09/19 150 lb (68 kg)  08/10/18 146 lb (66.2 kg)  07/12/18 141 lb (64 kg)    Physical Exam Constitutional:      General: He is not in acute distress.    Appearance: He is well-developed.     Comments: NAD  Eyes:     Conjunctiva/sclera: Conjunctivae normal.     Pupils: Pupils are equal, round, and reactive to light.  Neck:     Musculoskeletal: Normal range of motion.     Thyroid: No thyromegaly.     Vascular: No JVD.  Cardiovascular:     Rate and Rhythm: Normal rate and regular rhythm.     Heart sounds: Normal heart sounds. No murmur. No friction rub. No gallop.   Pulmonary:     Effort: Pulmonary effort is normal. No respiratory distress.     Breath sounds: Normal breath sounds. No wheezing or rales.  Chest:     Chest wall: No tenderness.  Abdominal:     General: Bowel sounds are normal. There is no distension.     Palpations: Abdomen is soft. There is no mass.     Tenderness: There is no abdominal tenderness. There is no guarding or rebound.  Musculoskeletal: Normal range of motion.        General: No tenderness.  Lymphadenopathy:     Cervical: No cervical adenopathy.  Skin:    General: Skin is warm and dry.     Findings: No rash.  Neurological:     Mental Status: He is alert and oriented to person, place, and time.     Cranial Nerves: No cranial nerve deficit.     Motor: No abnormal muscle tone.     Coordination: Coordination normal.     Gait: Gait normal.     Deep Tendon Reflexes: Reflexes are normal and symmetric.  Psychiatric:        Behavior: Behavior normal.        Thought Content: Thought content normal.        Judgment: Judgment normal.   rectal per Urology  Lab Results  Component Value Date   WBC 7.6 07/12/2018   HGB 14.7 07/12/2018   HCT 43.4 07/12/2018   PLT 186 07/12/2018   GLUCOSE 99 08/04/2018   CHOL 192 01/31/2018   TRIG 56.0 01/31/2018   HDL 72.90 01/31/2018   LDLDIRECT 144.9 11/14/2013   LDLCALC 108 (H)  01/31/2018   ALT 18 01/31/2018   AST 20 01/31/2018   NA 138 08/04/2018   K 4.8 08/04/2018   CL 103 08/04/2018   CREATININE 1.63 (H) 08/04/2018   BUN 25 (H) 08/04/2018   CO2 26 08/04/2018   TSH 0.56 01/22/2017   PSA 6.35 (H) 09/29/2017   INR 1.02 12/09/2015   HGBA1C 5.8 (H) 12/09/2015    No results found.  Assessment & Plan:   There are no diagnoses linked to this encounter.   No orders of the defined types were placed in this encounter.    Follow-up: No follow-ups on file.  Walker Kehr, MD

## 2019-02-09 NOTE — Assessment & Plan Note (Addendum)
Livalo - not taking due to $$$ CT ca score test offered 3/20

## 2019-05-12 ENCOUNTER — Other Ambulatory Visit: Payer: Medicare HMO

## 2019-05-12 ENCOUNTER — Other Ambulatory Visit: Payer: Self-pay | Admitting: Internal Medicine

## 2019-05-12 DIAGNOSIS — Z20822 Contact with and (suspected) exposure to covid-19: Secondary | ICD-10-CM

## 2019-05-12 DIAGNOSIS — R6889 Other general symptoms and signs: Secondary | ICD-10-CM | POA: Diagnosis not present

## 2019-05-12 NOTE — Progress Notes (Signed)
[id number] 

## 2019-05-18 LAB — NOVEL CORONAVIRUS, NAA: SARS-CoV-2, NAA: NOT DETECTED

## 2019-05-19 ENCOUNTER — Encounter: Payer: Self-pay | Admitting: Internal Medicine

## 2019-06-21 ENCOUNTER — Encounter: Payer: Self-pay | Admitting: Internal Medicine

## 2019-06-21 ENCOUNTER — Ambulatory Visit (INDEPENDENT_AMBULATORY_CARE_PROVIDER_SITE_OTHER)
Admission: RE | Admit: 2019-06-21 | Discharge: 2019-06-21 | Disposition: A | Discharge status: Home / Self Care | Payer: Medicare HMO | Source: Ambulatory Visit | Attending: Internal Medicine | Admitting: Internal Medicine

## 2019-06-21 ENCOUNTER — Other Ambulatory Visit: Payer: Self-pay | Admitting: Internal Medicine

## 2019-06-21 ENCOUNTER — Other Ambulatory Visit: Payer: Self-pay

## 2019-06-21 DIAGNOSIS — E785 Hyperlipidemia, unspecified: Secondary | ICD-10-CM

## 2019-06-21 DIAGNOSIS — I2510 Atherosclerotic heart disease of native coronary artery without angina pectoris: Secondary | ICD-10-CM

## 2019-06-21 MED ORDER — PRAVASTATIN SODIUM 20 MG PO TABS: 20.0000 mg | ORAL_TABLET | Freq: Every day | ORAL | 3 refills | Status: DC

## 2019-07-20 DIAGNOSIS — H25813 Combined forms of age-related cataract, bilateral: Secondary | ICD-10-CM | POA: Diagnosis not present

## 2019-07-20 DIAGNOSIS — H5213 Myopia, bilateral: Secondary | ICD-10-CM | POA: Diagnosis not present

## 2019-07-20 DIAGNOSIS — H43813 Vitreous degeneration, bilateral: Secondary | ICD-10-CM | POA: Diagnosis not present

## 2019-07-20 DIAGNOSIS — H04123 Dry eye syndrome of bilateral lacrimal glands: Secondary | ICD-10-CM | POA: Diagnosis not present

## 2019-07-25 ENCOUNTER — Ambulatory Visit (INDEPENDENT_AMBULATORY_CARE_PROVIDER_SITE_OTHER): Payer: Medicare HMO | Admitting: *Deleted

## 2019-07-25 ENCOUNTER — Other Ambulatory Visit: Payer: Self-pay

## 2019-07-25 DIAGNOSIS — Z23 Encounter for immunization: Secondary | ICD-10-CM

## 2019-08-06 ENCOUNTER — Encounter: Payer: Self-pay | Admitting: Internal Medicine

## 2019-08-06 ENCOUNTER — Other Ambulatory Visit: Payer: Self-pay | Admitting: Internal Medicine

## 2019-08-08 DIAGNOSIS — H268 Other specified cataract: Secondary | ICD-10-CM | POA: Diagnosis not present

## 2019-08-08 DIAGNOSIS — H25811 Combined forms of age-related cataract, right eye: Secondary | ICD-10-CM | POA: Diagnosis not present

## 2019-08-15 ENCOUNTER — Other Ambulatory Visit (INDEPENDENT_AMBULATORY_CARE_PROVIDER_SITE_OTHER): Payer: Medicare HMO

## 2019-08-15 ENCOUNTER — Other Ambulatory Visit: Payer: Self-pay

## 2019-08-15 ENCOUNTER — Ambulatory Visit (INDEPENDENT_AMBULATORY_CARE_PROVIDER_SITE_OTHER): Payer: Medicare HMO | Admitting: Internal Medicine

## 2019-08-15 ENCOUNTER — Encounter: Payer: Self-pay | Admitting: Internal Medicine

## 2019-08-15 DIAGNOSIS — I2510 Atherosclerotic heart disease of native coronary artery without angina pectoris: Secondary | ICD-10-CM | POA: Diagnosis not present

## 2019-08-15 DIAGNOSIS — N183 Chronic kidney disease, stage 3 unspecified: Secondary | ICD-10-CM

## 2019-08-15 DIAGNOSIS — I2583 Coronary atherosclerosis due to lipid rich plaque: Secondary | ICD-10-CM

## 2019-08-15 DIAGNOSIS — E785 Hyperlipidemia, unspecified: Secondary | ICD-10-CM

## 2019-08-15 LAB — LIPID PANEL
Cholesterol: 155 mg/dL (ref 0–200)
HDL: 55.4 mg/dL (ref >39.00)
LDL Cholesterol: 88 mg/dL (ref 0–99)
NonHDL: 99.51
Total CHOL/HDL Ratio: 3
Triglycerides: 59 mg/dL (ref 0.0–149.0)
VLDL: 11.8 mg/dL (ref 0.0–40.0)

## 2019-08-15 LAB — HEPATIC FUNCTION PANEL
ALT: 19 U/L (ref 0–53)
AST: 23 U/L (ref 0–37)
Albumin: 4 g/dL (ref 3.5–5.2)
Alkaline Phosphatase: 79 U/L (ref 39–117)
Bilirubin, Direct: 0 mg/dL (ref 0.0–0.3)
Total Bilirubin: 0.5 mg/dL (ref 0.2–1.2)
Total Protein: 7.4 g/dL (ref 6.0–8.3)

## 2019-08-15 LAB — CK: Total CK: 77 U/L (ref 7–232)

## 2019-08-15 NOTE — Progress Notes (Signed)
Subjective:  Patient ID: Sergio Hughes, male    DOB: 23-Apr-1941  Age: 78 y.o. MRN: [id number]  CC: No chief complaint on file.   HPI CHAMP KEETCH presents for CAD, dyslipidemia, myalgias - resolved Lost wt on diet   Outpatient Medications Prior to Visit  Medication Sig Dispense Refill  . amLODipine (NORVASC) 2.5 MG tablet TAKE 1 TABLET (2.5 MG TOTAL) DAILY BY MOUTH. 90 tablet 3  . aspirin 81 MG EC tablet Take 81 mg by mouth daily.      . Cholecalciferol (VITAMIN D) 2000 UNITS CAPS Take 1 capsule by mouth daily.      . Glucosamine-Chondroit-Vit C-Mn (GLUCOSAMINE 1500 COMPLEX) CAPS Take by mouth.     No facility-administered medications prior to visit.     ROS: Review of Systems  Constitutional: Negative for appetite change, fatigue and unexpected weight change.  HENT: Negative for congestion, nosebleeds, sneezing, sore throat and trouble swallowing.   Eyes: Negative for itching and visual disturbance.  Respiratory: Negative for cough.   Cardiovascular: Negative for chest pain, palpitations and leg swelling.  Gastrointestinal: Negative for abdominal distention, blood in stool, diarrhea and nausea.  Genitourinary: Negative for frequency and hematuria.  Musculoskeletal: Negative for back pain, gait problem, joint swelling and neck pain.  Skin: Negative for rash.  Neurological: Negative for dizziness, tremors, speech difficulty and weakness.  Psychiatric/Behavioral: Negative for agitation, dysphoric mood, sleep disturbance and suicidal ideas. The patient is not nervous/anxious.     Objective:  BP 130/76 (BP Location: Left Arm, Patient Position: Sitting, Cuff Size: Normal)   Pulse 63   Temp 98 F (36.7 C) (Oral)   Ht 5\' 5"  (1.651 m)   Wt 143 lb (64.9 kg)   SpO2 99%   BMI 23.80 kg/m   BP Readings from Last 3 Encounters:  08/15/19 130/76  02/09/19 134/84  08/10/18 132/82    Wt Readings from Last 3 Encounters:  08/15/19 143 lb (64.9 kg)  02/09/19 150 lb (68 kg)   08/10/18 146 lb (66.2 kg)    Physical Exam Constitutional:      General: He is not in acute distress.    Appearance: He is well-developed.     Comments: NAD  Eyes:     Conjunctiva/sclera: Conjunctivae normal.     Pupils: Pupils are equal, round, and reactive to light.  Neck:     Musculoskeletal: Normal range of motion.     Thyroid: No thyromegaly.     Vascular: No JVD.  Cardiovascular:     Rate and Rhythm: Normal rate and regular rhythm.     Heart sounds: Normal heart sounds. No murmur. No friction rub. No gallop.   Pulmonary:     Effort: Pulmonary effort is normal. No respiratory distress.     Breath sounds: Normal breath sounds. No wheezing or rales.  Chest:     Chest wall: No tenderness.  Abdominal:     General: Bowel sounds are normal. There is no distension.     Palpations: Abdomen is soft. There is no mass.     Tenderness: There is no abdominal tenderness. There is no guarding or rebound.  Musculoskeletal: Normal range of motion.        General: No tenderness.  Lymphadenopathy:     Cervical: No cervical adenopathy.  Skin:    General: Skin is warm and dry.     Findings: No rash.  Neurological:     Mental Status: He is alert and oriented to person, place, and time.  Cranial Nerves: No cranial nerve deficit.     Motor: No abnormal muscle tone.     Coordination: Coordination normal.     Gait: Gait normal.     Deep Tendon Reflexes: Reflexes are normal and symmetric.  Psychiatric:        Behavior: Behavior normal.        Thought Content: Thought content normal.        Judgment: Judgment normal.     Lab Results  Component Value Date   WBC 9.5 02/09/2019   HGB 15.4 02/09/2019   HCT 45.2 02/09/2019   PLT 208.0 02/09/2019   GLUCOSE 99 02/09/2019   CHOL 231 (H) 02/09/2019   TRIG 53.0 02/09/2019   HDL 79.30 02/09/2019   LDLDIRECT 144.9 11/14/2013   LDLCALC 141 (H) 02/09/2019   ALT 20 02/09/2019   AST 23 02/09/2019   NA 140 02/09/2019   K 5.0 02/09/2019    CL 106 02/09/2019   CREATININE 1.65 (H) 02/09/2019   BUN 32 (H) 02/09/2019   CO2 26 02/09/2019   TSH 0.46 02/09/2019   PSA 6.35 (H) 09/29/2017   INR 1.02 12/09/2015   HGBA1C 5.8 (H) 12/09/2015    Ct Cardiac Scoring  Addendum Date: 06/21/2019   ADDENDUM REPORT: 06/21/2019 15:29 CLINICAL DATA:  Risk stratification EXAM: Coronary Calcium Score TECHNIQUE: The patient was scanned on a Siemens Somatom 64 slice scanner. Axial non-contrast 3 mm slices were carried out through the heart. The data set was analyzed on a dedicated work station and scored using the [place]. FINDINGS: Non-cardiac: See separate report from [facility]. Ascending aorta: Appears enlarged but measures 3.6 cm Pericardium: Normal Coronary arteries: Dense 3 vessel coronary calcification IMPRESSION: Coronary calcium score of 1709. This was 38 rd percentile for age and sex matched control. Circumflex hard to score due to dense mitral annular calcification Consider f/u perfusion study given How high score is [place] Electronically Signed   By: Jenkins Rouge M.D.   On: 06/21/2019 15:29   Result Date: 06/21/2019 EXAM: OVER-READ INTERPRETATION  CT CHEST The following report is an over-read performed by radiologist Dr. Suzy Bouchard of [facility], [place] on 06/21/2019. This over-read does not include interpretation of cardiac or coronary anatomy or pathology. The coronary calcium score/coronary CTA interpretation by the cardiologist is attached. COMPARISON:  None. FINDINGS: Limited view of the lung parenchyma demonstrates benign calcified nodule in the RIGHT lower lobe. Mild ground-glass opacities in the bilateral lung bases most suggestive of atelectasis. Airways are normal. Limited view of the mediastinum demonstrates no adenopathy. Esophagus normal. Limited view of the upper abdomen unremarkable. Benign granulomata noted within the liver and spleen. Limited view of the skeleton and chest wall is unremarkable.  IMPRESSION: Evidence of benign granulomatous disease. Electronically Signed: By: Suzy Bouchard M.D. On: 06/21/2019 10:01    Assessment & Plan:   There are no diagnoses linked to this encounter.   No orders of the defined types were placed in this encounter.    Follow-up: No follow-ups on file.  Walker Kehr, MD

## 2019-08-15 NOTE — Assessment & Plan Note (Signed)
ASA. Unable to take Lovastatin in the past Pravastatin trial - so far doing ok Card appt pending soon

## 2019-08-15 NOTE — Assessment & Plan Note (Signed)
Labs

## 2019-08-15 NOTE — Assessment & Plan Note (Signed)
Hydrate well 

## 2019-08-22 ENCOUNTER — Ambulatory Visit: Payer: Medicare HMO | Admitting: Cardiology

## 2019-08-22 ENCOUNTER — Encounter

## 2019-08-22 ENCOUNTER — Encounter: Payer: Self-pay | Admitting: Cardiology

## 2019-08-22 ENCOUNTER — Other Ambulatory Visit: Payer: Self-pay

## 2019-08-22 VITALS — BP 115/69 | HR 67 | Temp 96.8°F | Ht 65.0 in | Wt 142.0 lb

## 2019-08-22 DIAGNOSIS — I10 Essential (primary) hypertension: Secondary | ICD-10-CM

## 2019-08-22 DIAGNOSIS — I2510 Atherosclerotic heart disease of native coronary artery without angina pectoris: Secondary | ICD-10-CM

## 2019-08-22 DIAGNOSIS — E782 Mixed hyperlipidemia: Secondary | ICD-10-CM

## 2019-08-22 NOTE — Patient Instructions (Signed)
Medication Instructions:  Your physician recommends that you continue on your current medications as directed. Please refer to the Current Medication list given to you today.   Labwork: NONE  Testing/Procedures: Your physician has requested that you have a lexiscan myoview. For further information please visit [email or web address]. Please follow instruction sheet, as given.    Follow-Up: Your physician recommends that you schedule a follow-up appointment in:  TO BE DETERMINED BASED ON TEST    Any Other Special Instructions Will Be Listed Below (If Applicable).     If you need a refill on your cardiac medications before your next appointment, please call your pharmacy.

## 2019-08-22 NOTE — Progress Notes (Signed)
Cardiology Office Note  Date: 08/22/2019   ID: Sergio Hughes 05-Mar-1941, MRN [id number]  PCP:  Sergio Anger, MD  Cardiologist: Sergio Sark, MD Electrophysiologist:  None   Chief Complaint  Patient presents with  . Cardiac evaluation    History of Present Illness: Sergio Hughes is a 78 y.o. male referred for cardiology consultation by Dr. Alain Hughes due to abnormal cardiac CT with calcium score 1709 and dense three-vessel coronary calcification.  He presents today for evaluation.  We discussed the results of his recent testing.  He does not report any angina at this time, has NYHA class II dyspnea with most activities, stays active including outdoor work.  He feels like he has been somewhat more fatigued in the last few months, but also notes that he has been on Pravachol during that time and has had some side effects with this intermittently.  He states that he has been compliant however with his medications including aspirin, Norvasc, and Pravachol.  Recent follow-up lipids showed improvement in LDL from 144 down to 88.  Today we discussed importance of medical therapy, exercise and diet, also further cardiac testing including physiologic versus anatomical options.  Past Medical History:  Diagnosis Date  . Hyperlipidemia   . Hypertension   . Osteoarthritis     Past Surgical History:  Procedure Laterality Date  . COLONOSCOPY    . INGUINAL HERNIA REPAIR  2005   Right  . PARTIAL COLECTOMY Right 12/16/2015   Procedure: PARTIAL CECECTOMY AND APPENDECTOMY;  Surgeon: Sergio Horn, MD;  Location: [place];  Service: General;  Laterality: Right;  . POLYPECTOMY    . VASECTOMY      Current Outpatient Medications  Medication Sig Dispense Refill  . amLODipine (NORVASC) 2.5 MG tablet TAKE 1 TABLET (2.5 MG TOTAL) DAILY BY MOUTH. 90 tablet 3  . aspirin 81 MG EC tablet Take 81 mg by mouth daily.      . Cholecalciferol (VITAMIN D) 2000 UNITS CAPS Take 1 capsule by mouth  daily.      . Glucosamine-Chondroit-Vit C-Mn (GLUCOSAMINE 1500 COMPLEX) CAPS Take by mouth.    . moxifloxacin (VIGAMOX) 0.5 % ophthalmic solution     . Omega-3 Fatty Acids (FISH OIL) 1000 MG CAPS     . pravastatin (PRAVACHOL) 20 MG tablet      No current facility-administered medications for this visit.    Allergies:  Bupropion hcl, Lovastatin, and Pravastatin   Social History: The patient  reports that he quit smoking about 53 years ago. His smoking use included cigarettes. He has never used smokeless tobacco. He reports previous alcohol use of about 21.0 standard drinks of alcohol per week. He reports that he does not use drugs.   Family History: The patient's family history includes Cancer in his father; Hypertension in his father; Prostate cancer (age of onset: 71) in his brother.   ROS:  Please see the history of present illness. Otherwise, complete review of systems is positive for none.  All other systems are reviewed and negative.   Physical Exam: VS:  BP 115/69   Pulse 67   Temp (!) 96.8 F (36 C)   Ht 5\' 5"  (1.651 m)   Wt 142 lb (64.4 kg)   SpO2 99%   BMI 23.63 kg/m , BMI Body mass index is 23.63 kg/m.  Wt Readings from Last 3 Encounters:  08/22/19 142 lb (64.4 kg)  08/15/19 143 lb (64.9 kg)  02/09/19 150 lb (68 kg)  General: Elderly male.  Patient appears comfortable at rest. HEENT: Conjunctiva and lids normal, wearing a mask. Neck: Supple, no elevated JVP or carotid bruits, no thyromegaly. Lungs: Clear to auscultation, nonlabored breathing at rest. Cardiac: Regular rate and rhythm, no S3 or significant systolic murmur, no pericardial rub. Abdomen: Soft, nontender, bowel sounds present. Extremities: No pitting edema, distal pulses 2+. Skin: Warm and dry. Musculoskeletal: No kyphosis. Neuropsychiatric: Alert and oriented x3, affect grossly appropriate.  ECG:  An ECG dated 07/12/2018 was personally reviewed today and demonstrated:  Sinus rhythm with right bundle  branch block.  Recent Labwork: 02/09/2019: BUN 32; Creatinine, Ser 1.65; Hemoglobin 15.4; Platelets 208.0; Potassium 5.0; Sodium 140; TSH 0.46 08/15/2019: ALT 19; AST 23     Component Value Date/Time   CHOL 155 08/15/2019 1017   TRIG 59.0 08/15/2019 1017   HDL 55.40 08/15/2019 1017   CHOLHDL 3 08/15/2019 1017   VLDL 11.8 08/15/2019 1017   LDLCALC 88 08/15/2019 1017   LDLDIRECT 144.9 11/14/2013 0859    Other Studies Reviewed Today:  Cardiac CT 06/21/2019: FINDINGS: Non-cardiac: See separate report from [facility].  Ascending aorta: Appears enlarged but measures 3.6 cm  Pericardium: Normal  Coronary arteries: Dense 3 vessel coronary calcification  IMPRESSION: Coronary calcium score of 1709. This was 30 rd percentile for age and sex matched control.  Circumflex hard to score due to dense mitral annular calcification Consider f/u perfusion study given  How high score is  S[facility]  Assessment and Plan:  1.  Dense three-vessel coronary artery calcification with calcium score of 1709.  Sergio Hughes is a 78 year old male with hypertension and hyperlipidemia, now on aspirin, Norvasc, and Pravachol with reduction in LDL from 144-88 by most recent testing.  He does not describe any angina symptoms or major functional limitations with typical activities at this point.  We have discussed options for further evaluation and at this point he is comfortable with noninvasive assessment.  We will obtain a Lexiscan Myoview to evaluate ischemic burden.  Unless this is significantly abnormal, we will continue with medical therapy and observation.  Otherwise, cardiac catheterization can be considered.  We have discussed warning signs and symptoms.  2.  Essential hypertension, on Norvasc.  Blood pressure is well controlled today.  3.  Mixed hyperlipidemia, currently tolerating Pravachol at 20 mg daily.  Recent LDL 88 is down from 144.  Medication Adjustments/Labs and Tests  Ordered: Current medicines are reviewed at length with the patient today.  Concerns regarding medicines are outlined above.   Tests Ordered: Orders Placed This Encounter  Procedures  . NM Myocar Multi W/Spect W/Wall Motion / EF    Medication Changes: No orders of the defined types were placed in this encounter.   Disposition:  Follow up test results and determine disposition.  Signed, Sergio Sark, MD, [facility] 08/22/2019 9:24 AM    [place] [facility] [place] [address] [place], [place] [address] Phone: [phone]; Fax: [phone]

## 2019-08-29 DIAGNOSIS — H25812 Combined forms of age-related cataract, left eye: Secondary | ICD-10-CM | POA: Diagnosis not present

## 2019-08-29 DIAGNOSIS — H268 Other specified cataract: Secondary | ICD-10-CM | POA: Diagnosis not present

## 2019-09-01 ENCOUNTER — Other Ambulatory Visit: Payer: Self-pay | Admitting: Internal Medicine

## 2019-09-04 ENCOUNTER — Encounter (HOSPITAL_COMMUNITY): Payer: Medicare HMO

## 2019-09-04 ENCOUNTER — Encounter (HOSPITAL_COMMUNITY): Payer: Self-pay

## 2019-09-07 ENCOUNTER — Encounter (HOSPITAL_COMMUNITY): Payer: Self-pay

## 2019-09-07 ENCOUNTER — Other Ambulatory Visit: Payer: Self-pay

## 2019-09-07 ENCOUNTER — Encounter (HOSPITAL_BASED_OUTPATIENT_CLINIC_OR_DEPARTMENT_OTHER)
Admission: RE | Admit: 2019-09-07 | Discharge: 2019-09-07 | Disposition: A | Discharge status: Home / Self Care | Payer: Medicare HMO | Source: Ambulatory Visit | Attending: Cardiology | Admitting: Cardiology

## 2019-09-07 ENCOUNTER — Encounter (HOSPITAL_COMMUNITY)
Admission: RE | Admit: 2019-09-07 | Discharge: 2019-09-07 | Disposition: A | Discharge status: Home / Self Care | Payer: Medicare HMO | Source: Ambulatory Visit | Attending: Cardiology | Admitting: Cardiology

## 2019-09-07 DIAGNOSIS — I2510 Atherosclerotic heart disease of native coronary artery without angina pectoris: Secondary | ICD-10-CM | POA: Insufficient documentation

## 2019-09-07 LAB — NM MYOCAR MULTI W/SPECT W/WALL MOTION / EF
LV dias vol: 70 mL (ref 62–150)
LV sys vol: 22 mL
Peak HR: 93 {beats}/min
RATE: 0.42
Rest HR: 54 {beats}/min
SDS: 5
SRS: 4
SSS: 9
TID: 0.81

## 2019-09-07 MED ORDER — TECHNETIUM TC 99M TETROFOSMIN IV KIT
30.0000 | PACK | Freq: Once | INTRAVENOUS | Status: AC | PRN
  Administered 2019-09-07: 28.5 via INTRAVENOUS

## 2019-09-07 MED ORDER — TECHNETIUM TC 99M TETROFOSMIN IV KIT
10.0000 | PACK | Freq: Once | INTRAVENOUS | Status: AC | PRN
  Administered 2019-09-07: 11 via INTRAVENOUS

## 2019-09-07 MED ORDER — REGADENOSON 0.4 MG/5ML IV SOLN
INTRAVENOUS | Status: AC
  Administered 2019-09-07: 0.4 mg via INTRAVENOUS
  Filled 2019-09-07: qty 5

## 2019-09-07 MED ORDER — SODIUM CHLORIDE 0.9% FLUSH
INTRAVENOUS | Status: AC
  Administered 2019-09-07: 10:00:00 10 mL via INTRAVENOUS
  Filled 2019-09-07: qty 10

## 2019-09-08 ENCOUNTER — Telehealth: Payer: Self-pay | Admitting: *Deleted

## 2019-09-08 NOTE — Telephone Encounter (Signed)
-----   Message from Satira Sark, MD sent at 09/07/2019  3:29 PM EDT ----- Results reviewed.  Please let him know that the stress test was overall low risk with a mild ischemic territory and normal cardiac function.  In the absence of obvious angina symptoms on medical therapy I would suggest observation for now.  We should see him back in about 6 months.

## 2019-09-08 NOTE — Telephone Encounter (Signed)
Called patient with test results. No answer. Left message to call back.  

## 2019-10-23 DIAGNOSIS — Z961 Presence of intraocular lens: Secondary | ICD-10-CM | POA: Diagnosis not present

## 2019-12-06 ENCOUNTER — Other Ambulatory Visit: Payer: Self-pay | Admitting: Internal Medicine

## 2019-12-06 DIAGNOSIS — R8290 Unspecified abnormal findings in urine: Secondary | ICD-10-CM

## 2019-12-13 ENCOUNTER — Ambulatory Visit: Payer: Medicare HMO | Attending: Internal Medicine

## 2019-12-13 DIAGNOSIS — Z23 Encounter for immunization: Secondary | ICD-10-CM | POA: Insufficient documentation

## 2019-12-13 NOTE — Progress Notes (Signed)
   Covid-19 Vaccination Clinic  Name:  DAIRON PROCTER    MRN: [id number] DOB: 03/21/1941  12/13/2019  Mr. Bougie was observed post Covid-19 immunization for 15 minutes without incidence. He was provided with Vaccine Information Sheet and instruction to access the V-Safe system.   Mr. Terhune was instructed to call 911 with any severe reactions post vaccine: Marland Kitchen Difficulty breathing  . Swelling of your face and throat  . A fast heartbeat  . A bad rash all over your body  . Dizziness and weakness    Immunizations Administered    Name Date Dose VIS Date Route   Pfizer COVID-19 Vaccine 12/13/2019 12:17 PM 0.3 mL 11/03/2019 Intramuscular   Manufacturer: [place]   Lot: [id number]   [place]: [address]-1

## 2019-12-19 ENCOUNTER — Encounter: Payer: Self-pay | Admitting: Internal Medicine

## 2019-12-19 ENCOUNTER — Other Ambulatory Visit: Payer: Self-pay

## 2019-12-19 ENCOUNTER — Ambulatory Visit (INDEPENDENT_AMBULATORY_CARE_PROVIDER_SITE_OTHER): Payer: Medicare HMO | Admitting: Internal Medicine

## 2019-12-19 DIAGNOSIS — R8290 Unspecified abnormal findings in urine: Secondary | ICD-10-CM

## 2019-12-19 DIAGNOSIS — R82998 Other abnormal findings in urine: Secondary | ICD-10-CM | POA: Diagnosis not present

## 2019-12-19 DIAGNOSIS — I2510 Atherosclerotic heart disease of native coronary artery without angina pectoris: Secondary | ICD-10-CM | POA: Diagnosis not present

## 2019-12-19 DIAGNOSIS — N183 Chronic kidney disease, stage 3 unspecified: Secondary | ICD-10-CM | POA: Diagnosis not present

## 2019-12-19 DIAGNOSIS — I2583 Coronary atherosclerosis due to lipid rich plaque: Secondary | ICD-10-CM

## 2019-12-19 LAB — URINALYSIS
Bilirubin Urine: NEGATIVE
Hgb urine dipstick: NEGATIVE
Ketones, ur: NEGATIVE
Leukocytes,Ua: NEGATIVE
Nitrite: NEGATIVE
Specific Gravity, Urine: 1.02 (ref 1.000–1.030)
Urine Glucose: NEGATIVE
Urobilinogen, UA: 0.2 (ref 0.0–1.0)
pH: 5.5 (ref 5.0–8.0)

## 2019-12-19 NOTE — Assessment & Plan Note (Signed)
UA

## 2019-12-19 NOTE — Assessment & Plan Note (Signed)
Hydrate well 

## 2019-12-19 NOTE — Assessment & Plan Note (Signed)
ASA. Unable to take Lovastatin in the past Pravastatin

## 2019-12-19 NOTE — Progress Notes (Signed)
Subjective:  Patient ID: Sergio Hughes, male    DOB: 1941/06/28  Age: 79 y.o. MRN: [id number]  CC: No chief complaint on file.   HPI Sergio Hughes presents for CAD, dyslipidemia, HTN f/u C/o foamy urine  Outpatient Medications Prior to Visit  Medication Sig Dispense Refill  . amLODipine (NORVASC) 2.5 MG tablet TAKE 1 TABLET EVERY DAY 90 tablet 3  . aspirin 81 MG EC tablet Take 81 mg by mouth daily.      . Cholecalciferol (VITAMIN D) 2000 UNITS CAPS Take 1 capsule by mouth daily.      . Glucosamine-Chondroit-Vit C-Mn (GLUCOSAMINE 1500 COMPLEX) CAPS Take by mouth.    . moxifloxacin (VIGAMOX) 0.5 % ophthalmic solution     . Omega-3 Fatty Acids (FISH OIL) 1000 MG CAPS     . pravastatin (PRAVACHOL) 20 MG tablet      No facility-administered medications prior to visit.    ROS: Review of Systems  Constitutional: Negative for appetite change, fatigue and unexpected weight change.  HENT: Negative for congestion, nosebleeds, sneezing, sore throat and trouble swallowing.   Eyes: Negative for itching and visual disturbance.  Respiratory: Negative for cough.   Cardiovascular: Negative for chest pain, palpitations and leg swelling.  Gastrointestinal: Negative for abdominal distention, blood in stool, diarrhea and nausea.  Genitourinary: Negative for frequency and hematuria.  Musculoskeletal: Negative for back pain, gait problem, joint swelling and neck pain.  Skin: Negative for rash.  Neurological: Negative for dizziness, tremors, speech difficulty and weakness.  Psychiatric/Behavioral: Negative for agitation, dysphoric mood, sleep disturbance and suicidal ideas. The patient is not nervous/anxious.     Objective:  BP 132/78 (BP Location: Left Arm, Patient Position: Sitting, Cuff Size: Normal)   Pulse 65   Temp 98.1 F (36.7 C) (Oral)   Ht 5\' 5"  (1.651 m)   Wt 147 lb (66.7 kg)   SpO2 98%   BMI 24.46 kg/m   BP Readings from Last 3 Encounters:  12/19/19 132/78  08/22/19 115/69   08/15/19 130/76    Wt Readings from Last 3 Encounters:  12/19/19 147 lb (66.7 kg)  08/22/19 142 lb (64.4 kg)  08/15/19 143 lb (64.9 kg)    Physical Exam Constitutional:      General: He is not in acute distress.    Appearance: He is well-developed. He is not diaphoretic.     Comments: NAD  HENT:     Head: Normocephalic and atraumatic.     Right Ear: External ear normal.     Left Ear: External ear normal.     Nose: Nose normal.     Mouth/Throat:     Pharynx: No oropharyngeal exudate.  Eyes:     General: No scleral icterus.       Right eye: No discharge.        Left eye: No discharge.     Conjunctiva/sclera: Conjunctivae normal.     Pupils: Pupils are equal, round, and reactive to light.  Neck:     Thyroid: No thyromegaly.     Vascular: No JVD.     Trachea: No tracheal deviation.  Cardiovascular:     Rate and Rhythm: Normal rate and regular rhythm.     Heart sounds: Normal heart sounds. No murmur. No friction rub. No gallop.   Pulmonary:     Effort: Pulmonary effort is normal. No respiratory distress.     Breath sounds: Normal breath sounds. No stridor. No wheezing or rales.  Chest:     Chest  wall: No tenderness.  Abdominal:     General: Bowel sounds are normal. There is no distension.     Palpations: Abdomen is soft. There is no mass.     Tenderness: There is no abdominal tenderness. There is no guarding or rebound.  Genitourinary:    Penis: Normal. No tenderness.      Prostate: Normal.     Rectum: Normal. Guaiac result negative.  Musculoskeletal:        General: No tenderness. Normal range of motion.     Cervical back: Normal range of motion and neck supple.  Lymphadenopathy:     Cervical: No cervical adenopathy.  Skin:    General: Skin is warm and dry.     Coloration: Skin is not pale.     Findings: No erythema or rash.  Neurological:     Mental Status: He is alert and oriented to person, place, and time.     Cranial Nerves: No cranial nerve deficit.      Motor: No abnormal muscle tone.     Coordination: Coordination normal.     Gait: Gait normal.     Deep Tendon Reflexes: Reflexes are normal and symmetric. Reflexes normal.  Psychiatric:        Behavior: Behavior normal.        Thought Content: Thought content normal.        Judgment: Judgment normal.     Lab Results  Component Value Date   WBC 9.5 02/09/2019   HGB 15.4 02/09/2019   HCT 45.2 02/09/2019   PLT 208.0 02/09/2019   GLUCOSE 99 02/09/2019   CHOL 155 08/15/2019   TRIG 59.0 08/15/2019   HDL 55.40 08/15/2019   LDLDIRECT 144.9 11/14/2013   LDLCALC 88 08/15/2019   ALT 19 08/15/2019   AST 23 08/15/2019   NA 140 02/09/2019   K 5.0 02/09/2019   CL 106 02/09/2019   CREATININE 1.65 (H) 02/09/2019   BUN 32 (H) 02/09/2019   CO2 26 02/09/2019   TSH 0.46 02/09/2019   PSA 6.35 (H) 09/29/2017   INR 1.02 12/09/2015   HGBA1C 5.8 (H) 12/09/2015    NM Myocar Multi W/Spect W/Wall Motion / EF  Result Date: 09/07/2019  No diagnostic ST segment changes to indicate ischemia.  Small, mild intensity, apical anteroseptal defect that is partially reversible and suggestive of a mild ischemic territory.  This is a low risk study.  Nuclear stress EF: 69%.     Assessment & Plan:   Walker Kehr, MD

## 2019-12-31 ENCOUNTER — Ambulatory Visit: Payer: Medicare HMO | Attending: Internal Medicine

## 2019-12-31 DIAGNOSIS — Z23 Encounter for immunization: Secondary | ICD-10-CM | POA: Insufficient documentation

## 2019-12-31 NOTE — Progress Notes (Signed)
   Covid-19 Vaccination Clinic  Name:  Sergio Hughes    MRN: [id number] DOB: 11-Feb-1941  12/31/2019  [name] was observed post Covid-19 immunization for 15 minutes without incidence. He was provided with Vaccine Information Sheet and instruction to access the V-Safe system.   [name] was instructed to call 911 with any severe reactions post vaccine: Marland Kitchen Difficulty breathing  . Swelling of your face and throat  . A fast heartbeat  . A bad rash all over your body  . Dizziness and weakness    Immunizations Administered    Name Date Dose VIS Date Route   Pfizer COVID-19 Vaccine 12/31/2019 10:49 AM 0.3 mL 11/03/2019 Intramuscular   Manufacturer: [place]   Lot: [id number]   [place]: [address]-3

## 2020-02-21 NOTE — Telephone Encounter (Signed)
Thank you for letting me know.  Would continue to keep an eye on any new symptoms and track blood pressure as you are.  It would not be unreasonable to increase amlodipine to 5 mg daily for both better control of blood pressure and also has a potential antianginal medication.

## 2020-03-18 ENCOUNTER — Ambulatory Visit: Payer: Medicare HMO | Admitting: Cardiology

## 2020-03-18 ENCOUNTER — Encounter: Payer: Self-pay | Admitting: Cardiology

## 2020-03-18 ENCOUNTER — Other Ambulatory Visit: Payer: Self-pay

## 2020-03-18 VITALS — BP 128/72 | HR 68 | Temp 98.1°F | Ht 65.0 in | Wt 149.0 lb

## 2020-03-18 DIAGNOSIS — I2510 Atherosclerotic heart disease of native coronary artery without angina pectoris: Secondary | ICD-10-CM

## 2020-03-18 DIAGNOSIS — I10 Essential (primary) hypertension: Secondary | ICD-10-CM

## 2020-03-18 DIAGNOSIS — E782 Mixed hyperlipidemia: Secondary | ICD-10-CM

## 2020-03-18 NOTE — Progress Notes (Signed)
Cardiology Office Note  Date: 03/18/2020   ID: Sergio, Hughes 04-Nov-1941, MRN [id number]  PCP:  Cassandria Anger, MD  Cardiologist:  Rozann Lesches, MD Electrophysiologist:  None   Chief Complaint  Patient presents with  . Cardiac follow-up    History of Present Illness: Sergio Hughes is a 79 y.o. male last seen in September 2020.  He presents for a routine visit.  He does not report any recurring exertional angina with any regularity.  He has not been as active during the pandemic, but is starting to get outside to do yard work.  Also interested in going back to the [facility].  He has had both doses of the vaccine.  I reviewed his medications which are outlined below.  He states that blood pressure control has been better when he checks it at home, there was a time period when it was elevated. Today's blood pressure looks good.  We discussed his diagnosis of coronary artery calcification and plan to continue medical therapy, although if he notices worsening exertional symptoms, a cardiac catheterization would be our next step.  Myoview from October 2020 showed mild apical anteroseptal ischemia with normal LVEF.  I personally reviewed his ECG today which shows sinus rhythm with old right bundle branch block and leftward axis.  Past Medical History:  Diagnosis Date  . Coronary artery calcification seen on CT scan   . Hyperlipidemia   . Hypertension   . Osteoarthritis     Past Surgical History:  Procedure Laterality Date  . COLONOSCOPY    . INGUINAL HERNIA REPAIR  2005   Right  . PARTIAL COLECTOMY Right 12/16/2015   Procedure: PARTIAL CECECTOMY AND APPENDECTOMY;  Surgeon: Judeth Horn, MD;  Location: [place];  Service: General;  Laterality: Right;  . POLYPECTOMY    . VASECTOMY      Current Outpatient Medications  Medication Sig Dispense Refill  . amLODipine (NORVASC) 2.5 MG tablet TAKE 1 TABLET EVERY DAY 90 tablet 3  . aspirin 81 MG EC tablet Take 81 mg by mouth daily.       . Cholecalciferol (VITAMIN D) 2000 UNITS CAPS Take 1 capsule by mouth daily.      . Glucosamine-Chondroit-Vit C-Mn (GLUCOSAMINE 1500 COMPLEX) CAPS Take by mouth.    . Omega-3 Fatty Acids (FISH OIL) 1000 MG CAPS     . pravastatin (PRAVACHOL) 20 MG tablet      No current facility-administered medications for this visit.   Allergies:  Bupropion hcl, Lovastatin, and Pravastatin   ROS:   No palpitations or syncope.  Physical Exam: VS:  BP 128/72   Pulse 68   Temp 98.1 F (36.7 C) (Temporal)   Ht 5\' 5"  (1.651 m)   Wt 149 lb (67.6 kg)   SpO2 97%   BMI 24.79 kg/m , BMI Body mass index is 24.79 kg/m.  Wt Readings from Last 3 Encounters:  03/18/20 149 lb (67.6 kg)  12/19/19 147 lb (66.7 kg)  08/22/19 142 lb (64.4 kg)    General: Elderly male, appears comfortable at rest. HEENT: Conjunctiva and lids normal, wearing a mask. Neck: Supple, no elevated JVP or carotid bruits, no thyromegaly. Lungs: Clear to auscultation, nonlabored breathing at rest. Cardiac: Regular rate and rhythm, no S3 or significant systolic murmur, no pericardial rub. Abdomen: Soft, bowel sounds present, no guarding or rebound. Extremities: No pitting edema, distal pulses 2+.  ECG:  An ECG dated 07/12/2018 was personally reviewed today and demonstrated:  Sinus rhythm with  right bundle branch block.  Recent Labwork: 08/15/2019: ALT 19; AST 23     Component Value Date/Time   CHOL 155 08/15/2019 1017   TRIG 59.0 08/15/2019 1017   HDL 55.40 08/15/2019 1017   CHOLHDL 3 08/15/2019 1017   VLDL 11.8 08/15/2019 1017   LDLCALC 88 08/15/2019 1017   LDLDIRECT 144.9 11/14/2013 0859    Other Studies Reviewed Today:  Cardiac CT 06/21/2019: FINDINGS: Non-cardiac: See separate report from [facility].  Ascending aorta: Appears enlarged but measures 3.6 cm  Pericardium: Normal  Coronary arteries: Dense 3 vessel coronary calcification  IMPRESSION: Coronary calcium score of 1709. This was 58 rd  percentile for age and sex matched control.  Circumflex hard to score due to dense mitral annular calcification Consider f/u perfusion study given how high score is.  Lexiscan Myoview 09/07/2019:  No diagnostic ST segment changes to indicate ischemia.  Small, mild intensity, apical anteroseptal defect that is partially reversible and suggestive of a mild ischemic territory.  This is a low risk study.  Nuclear stress EF: 69%.  Assessment and Plan:  1.  Multivessel coronary artery calcification with calcium score of 1709 and mild region of apical anteroseptal ischemia by Myoview and normal LVEF.  He is clinically stable without progressive angina on medical therapy.  Regimen includes aspirin, Norvasc, and Pravachol. ECG reviewed and stable.  Continue observation for now, if exertional symptoms escalate, a cardiac catheterization will be our next step.  2.  Mixed hyperlipidemia, tolerating Pravachol.  Last LDL 88.  3.  Essential hypertension, on Norvasc 2.5 mg daily, systolic in the 854O today.  No changes were made.  Medication Adjustments/Labs and Tests Ordered: Current medicines are reviewed at length with the patient today.  Concerns regarding medicines are outlined above.   Tests Ordered: No orders of the defined types were placed in this encounter.   Medication Changes: No orders of the defined types were placed in this encounter.   Disposition:  Follow up 6 months in the [place] office.  Signed, Satira Sark, MD, [facility] 03/18/2020 10:34 AM    [place] [facility] [place]. [address], [place], [place] [address] Phone: [phone]; Fax: [phone]

## 2020-03-18 NOTE — Addendum Note (Signed)
Addended by: Barbarann Ehlers A on: 03/18/2020 05:00 PM   Modules accepted: Orders

## 2020-03-18 NOTE — Patient Instructions (Signed)
Medication Instructions: Your physician recommends that you continue on your current medications as directed. Please refer to the Current Medication list given to you today.   Labwork: None today  Procedures/Testing: None today  Follow-Up: 6 months office visit with Dr.McDowell  Any Additional Special Instructions Will Be Listed Below (If Applicable).     If you need a refill on your cardiac medications before your next appointment, please call your pharmacy.

## 2020-04-18 ENCOUNTER — Other Ambulatory Visit: Payer: Self-pay

## 2020-04-18 ENCOUNTER — Ambulatory Visit (INDEPENDENT_AMBULATORY_CARE_PROVIDER_SITE_OTHER): Payer: Medicare HMO | Admitting: Internal Medicine

## 2020-04-18 ENCOUNTER — Encounter: Payer: Self-pay | Admitting: Internal Medicine

## 2020-04-18 DIAGNOSIS — E785 Hyperlipidemia, unspecified: Secondary | ICD-10-CM

## 2020-04-18 DIAGNOSIS — I2583 Coronary atherosclerosis due to lipid rich plaque: Secondary | ICD-10-CM

## 2020-04-18 DIAGNOSIS — N183 Chronic kidney disease, stage 3 unspecified: Secondary | ICD-10-CM

## 2020-04-18 DIAGNOSIS — I10 Essential (primary) hypertension: Secondary | ICD-10-CM

## 2020-04-18 DIAGNOSIS — Z0000 Encounter for general adult medical examination without abnormal findings: Secondary | ICD-10-CM

## 2020-04-18 DIAGNOSIS — R972 Elevated prostate specific antigen [PSA]: Secondary | ICD-10-CM

## 2020-04-18 DIAGNOSIS — I2510 Atherosclerotic heart disease of native coronary artery without angina pectoris: Secondary | ICD-10-CM | POA: Diagnosis not present

## 2020-04-18 LAB — HEPATIC FUNCTION PANEL
ALT: 16 U/L (ref 0–53)
AST: 23 U/L (ref 0–37)
Albumin: 4.1 g/dL (ref 3.5–5.2)
Alkaline Phosphatase: 82 U/L (ref 39–117)
Bilirubin, Direct: 0.1 mg/dL (ref 0.0–0.3)
Total Bilirubin: 0.6 mg/dL (ref 0.2–1.2)
Total Protein: 6.9 g/dL (ref 6.0–8.3)

## 2020-04-18 LAB — LIPID PANEL
Cholesterol: 160 mg/dL (ref 0–200)
HDL: 65.1 mg/dL (ref >39.00)
LDL Cholesterol: 85 mg/dL (ref 0–99)
NonHDL: 95.32
Total CHOL/HDL Ratio: 2
Triglycerides: 51 mg/dL (ref 0.0–149.0)
VLDL: 10.2 mg/dL (ref 0.0–40.0)

## 2020-04-18 LAB — BASIC METABOLIC PANEL
BUN: 32 mg/dL — ABNORMAL HIGH (ref 6–23)
CO2: 28 mEq/L (ref 19–32)
Calcium: 9.4 mg/dL (ref 8.4–10.5)
Chloride: 107 mEq/L (ref 96–112)
Creatinine, Ser: 1.93 mg/dL — ABNORMAL HIGH (ref 0.40–1.50)
GFR: 33.76 mL/min — ABNORMAL LOW (ref >60.00)
Glucose, Bld: 92 mg/dL (ref 70–99)
Potassium: 5.1 mEq/L (ref 3.5–5.1)
Sodium: 139 mEq/L (ref 135–145)

## 2020-04-18 LAB — TSH: TSH: 0.5 u[IU]/mL (ref 0.35–4.50)

## 2020-04-18 MED ORDER — PRAVASTATIN SODIUM 20 MG PO TABS: 20.0000 mg | ORAL_TABLET | Freq: Every day | ORAL | 3 refills | Status: DC

## 2020-04-18 NOTE — Patient Instructions (Signed)
Wt Readings from Last 3 Encounters:  04/18/20 145 lb (65.8 kg)  03/18/20 149 lb (67.6 kg)  12/19/19 147 lb (66.7 kg)

## 2020-04-18 NOTE — Assessment & Plan Note (Signed)
On Norvasc 

## 2020-04-18 NOTE — Assessment & Plan Note (Signed)
Labs

## 2020-04-18 NOTE — Progress Notes (Signed)
Subjective:  Patient ID: Sergio Hughes, male    DOB: [birth year]  Age: 79 y.o. MRN: [id number]  CC: No chief complaint on file.   HPI Sergio Hughes presents for HTN, CAD, dyslipidemia f/u  Outpatient Medications Prior to Visit  Medication Sig Dispense Refill  . amLODipine (NORVASC) 2.5 MG tablet TAKE 1 TABLET EVERY DAY 90 tablet 3  . aspirin 81 MG EC tablet Take 81 mg by mouth daily.      . Cholecalciferol (VITAMIN D) 2000 UNITS CAPS Take 1 capsule by mouth daily.      . Glucosamine-Chondroit-Vit C-Mn (GLUCOSAMINE 1500 COMPLEX) CAPS Take by mouth.    . Omega-3 Fatty Acids (FISH OIL) 1000 MG CAPS     . pravastatin (PRAVACHOL) 20 MG tablet      No facility-administered medications prior to visit.    ROS: Review of Systems  Constitutional: Negative for appetite change, fatigue and unexpected weight change.  HENT: Negative for congestion, nosebleeds, sneezing, sore throat and trouble swallowing.   Eyes: Negative for itching and visual disturbance.  Respiratory: Negative for cough.   Cardiovascular: Negative for chest pain, palpitations and leg swelling.  Gastrointestinal: Negative for abdominal distention, blood in stool, diarrhea and nausea.  Genitourinary: Negative for frequency and hematuria.  Musculoskeletal: Positive for arthralgias. Negative for back pain, gait problem, joint swelling and neck pain.  Skin: Negative for rash.  Neurological: Negative for dizziness, tremors, speech difficulty and weakness.  Psychiatric/Behavioral: Negative for agitation, dysphoric mood, sleep disturbance and suicidal ideas. The patient is not nervous/anxious.     Objective:  BP 130/80 (BP Location: Left Arm, Patient Position: Sitting, Cuff Size: Normal)   Pulse 61   Temp 97.6 F (36.4 C) (Oral)   Ht 5\' 5"  (1.651 m)   Wt 145 lb (65.8 kg)   SpO2 95%   BMI 24.13 kg/m   BP Readings from Last 3 Encounters:  04/18/20 130/80  03/18/20 128/72  12/19/19 132/78    Wt Readings from Last 3  Encounters:  04/18/20 145 lb (65.8 kg)  03/18/20 149 lb (67.6 kg)  12/19/19 147 lb (66.7 kg)    Physical Exam Constitutional:      General: He is not in acute distress.    Appearance: He is well-developed. He is not diaphoretic.     Comments: NAD  HENT:     Head: Normocephalic and atraumatic.     Right Ear: External ear normal.     Left Ear: External ear normal.     Nose: Nose normal.     Mouth/Throat:     Pharynx: No oropharyngeal exudate.  Eyes:     General: No scleral icterus.       Right eye: No discharge.        Left eye: No discharge.     Conjunctiva/sclera: Conjunctivae normal.     Pupils: Pupils are equal, round, and reactive to light.  Neck:     Thyroid: No thyromegaly.     Vascular: No JVD.     Trachea: No tracheal deviation.  Cardiovascular:     Rate and Rhythm: Normal rate and regular rhythm.     Heart sounds: Normal heart sounds. No murmur. No friction rub. No gallop.   Pulmonary:     Effort: Pulmonary effort is normal. No respiratory distress.     Breath sounds: Normal breath sounds. No stridor. No wheezing or rales.  Chest:     Chest wall: No tenderness.  Abdominal:     General: Bowel  sounds are normal. There is no distension.     Palpations: Abdomen is soft. There is no mass.     Tenderness: There is no abdominal tenderness. There is no guarding or rebound.  Genitourinary:    Penis: Normal. No tenderness.      Prostate: Normal.     Rectum: Normal. Guaiac result negative.  Musculoskeletal:        General: No tenderness. Normal range of motion.     Cervical back: Normal range of motion and neck supple.  Lymphadenopathy:     Cervical: No cervical adenopathy.  Skin:    General: Skin is warm and dry.     Coloration: Skin is not pale.     Findings: No erythema or rash.  Neurological:     Mental Status: He is alert and oriented to person, place, and time.     Cranial Nerves: No cranial nerve deficit.     Motor: No abnormal muscle tone.      Coordination: Coordination normal.     Gait: Gait normal.     Deep Tendon Reflexes: Reflexes are normal and symmetric. Reflexes normal.  Psychiatric:        Behavior: Behavior normal.        Thought Content: Thought content normal.        Judgment: Judgment normal.     Lab Results  Component Value Date   WBC 9.5 02/09/2019   HGB 15.4 02/09/2019   HCT 45.2 02/09/2019   PLT 208.0 02/09/2019   GLUCOSE 99 02/09/2019   CHOL 155 08/15/2019   TRIG 59.0 08/15/2019   HDL 55.40 08/15/2019   LDLDIRECT 144.9 11/14/2013   LDLCALC 88 08/15/2019   ALT 19 08/15/2019   AST 23 08/15/2019   NA 140 02/09/2019   K 5.0 02/09/2019   CL 106 02/09/2019   CREATININE 1.65 (H) 02/09/2019   BUN 32 (H) 02/09/2019   CO2 26 02/09/2019   TSH 0.46 02/09/2019   PSA 6.35 (H) 09/29/2017   INR 1.02 12/09/2015   HGBA1C 5.8 (H) 12/09/2015    NM Myocar Multi W/Spect W/Wall Motion / EF  Result Date: 09/07/2019  No diagnostic ST segment changes to indicate ischemia.  Small, mild intensity, apical anteroseptal defect that is partially reversible and suggestive of a mild ischemic territory.  This is a low risk study.  Nuclear stress EF: 69%.     Assessment & Plan:   There are no diagnoses linked to this encounter.    Walker Kehr, MD

## 2020-04-18 NOTE — Assessment & Plan Note (Signed)
Dr Alyson Ingles - d/c'd [name] - no need to f/u or check PSA

## 2020-04-18 NOTE — Assessment & Plan Note (Signed)
ASA Pravastatin

## 2020-04-18 NOTE — Assessment & Plan Note (Signed)
On Pravastatin 

## 2020-04-22 ENCOUNTER — Other Ambulatory Visit: Payer: Self-pay | Admitting: Internal Medicine

## 2020-04-22 DIAGNOSIS — N183 Chronic kidney disease, stage 3 unspecified: Secondary | ICD-10-CM

## 2020-04-26 ENCOUNTER — Other Ambulatory Visit: Payer: Self-pay | Admitting: Internal Medicine

## 2020-04-26 DIAGNOSIS — R82998 Other abnormal findings in urine: Secondary | ICD-10-CM

## 2020-04-26 DIAGNOSIS — N183 Chronic kidney disease, stage 3 unspecified: Secondary | ICD-10-CM

## 2020-04-29 NOTE — Addendum Note (Signed)
Addended by: Cresenciano Lick on: 04/29/2020 10:49 AM   Modules accepted: Orders

## 2020-05-10 ENCOUNTER — Ambulatory Visit
Admission: RE | Admit: 2020-05-10 | Discharge: 2020-05-10 | Disposition: A | Discharge status: Home / Self Care | Payer: Medicare HMO | Source: Ambulatory Visit | Attending: Internal Medicine | Admitting: Internal Medicine

## 2020-05-10 DIAGNOSIS — N183 Chronic kidney disease, stage 3 unspecified: Secondary | ICD-10-CM

## 2020-05-10 DIAGNOSIS — R82998 Other abnormal findings in urine: Secondary | ICD-10-CM

## 2020-05-10 DIAGNOSIS — N189 Chronic kidney disease, unspecified: Secondary | ICD-10-CM | POA: Diagnosis not present

## 2020-05-15 ENCOUNTER — Other Ambulatory Visit: Payer: Self-pay | Admitting: Internal Medicine

## 2020-05-15 DIAGNOSIS — I10 Essential (primary) hypertension: Secondary | ICD-10-CM

## 2020-05-15 DIAGNOSIS — N183 Chronic kidney disease, stage 3 unspecified: Secondary | ICD-10-CM

## 2020-05-28 ENCOUNTER — Other Ambulatory Visit: Payer: Self-pay | Admitting: *Deleted

## 2020-05-28 DIAGNOSIS — R82998 Other abnormal findings in urine: Secondary | ICD-10-CM

## 2020-05-28 DIAGNOSIS — N183 Chronic kidney disease, stage 3 unspecified: Secondary | ICD-10-CM | POA: Diagnosis not present

## 2020-05-29 LAB — UPEP/TP, 24-HR URINE
Albumin, U: 31.1 %
Alpha 1, Urine: 5.3 %
Alpha 2, Urine: 14.8 %
Beta, Urine: 25.2 %
Gamma Globulin, Urine: 23.7 %
Protein, 24H Urine: 142 mg/24 hr (ref 30–150)
Protein, Ur: 8.1 mg/dL

## 2020-06-05 ENCOUNTER — Other Ambulatory Visit: Payer: Medicare HMO

## 2020-06-05 ENCOUNTER — Other Ambulatory Visit: Payer: Self-pay

## 2020-06-05 DIAGNOSIS — N183 Chronic kidney disease, stage 3 unspecified: Secondary | ICD-10-CM | POA: Diagnosis not present

## 2020-06-05 NOTE — Addendum Note (Signed)
Addended by: Cresenciano Lick on: 06/05/2020 08:52 AM   Modules accepted: Orders

## 2020-06-06 LAB — BASIC METABOLIC PANEL
BUN/Creatinine Ratio: 15 (calc) (ref 6–22)
BUN: 31 mg/dL — ABNORMAL HIGH (ref 7–25)
CO2: 30 mmol/L (ref 20–32)
Calcium: 9.2 mg/dL (ref 8.6–10.3)
Chloride: 106 mmol/L (ref 98–110)
Creat: 2.01 mg/dL — ABNORMAL HIGH (ref 0.70–1.18)
Glucose, Bld: 117 mg/dL — ABNORMAL HIGH (ref 65–99)
Potassium: 4.4 mmol/L (ref 3.5–5.3)
Sodium: 141 mmol/L (ref 135–146)

## 2020-07-08 DIAGNOSIS — I129 Hypertensive chronic kidney disease with stage 1 through stage 4 chronic kidney disease, or unspecified chronic kidney disease: Secondary | ICD-10-CM | POA: Diagnosis not present

## 2020-07-08 DIAGNOSIS — R809 Proteinuria, unspecified: Secondary | ICD-10-CM | POA: Diagnosis not present

## 2020-07-08 DIAGNOSIS — N1832 Chronic kidney disease, stage 3b: Secondary | ICD-10-CM | POA: Diagnosis not present

## 2020-07-23 ENCOUNTER — Other Ambulatory Visit: Payer: Self-pay | Admitting: Internal Medicine

## 2020-07-23 DIAGNOSIS — D485 Neoplasm of uncertain behavior of skin: Secondary | ICD-10-CM

## 2020-08-22 ENCOUNTER — Ambulatory Visit: Payer: Medicare HMO | Attending: Internal Medicine

## 2020-08-22 DIAGNOSIS — Z23 Encounter for immunization: Secondary | ICD-10-CM

## 2020-08-22 NOTE — Progress Notes (Signed)
   Covid-19 Vaccination Clinic  Name:  Sergio Hughes    MRN: [id number] DOB: 17-Mar-1941  08/22/2020  [name] was observed post Covid-19 immunization for 15 minutes without incident. He was provided with Vaccine Information Sheet and instruction to access the V-Safe system.   [name] was instructed to call 911 with any severe reactions post vaccine: Marland Kitchen Difficulty breathing  . Swelling of face and throat  . A fast heartbeat  . A bad rash all over body  . Dizziness and weakness

## 2020-08-26 ENCOUNTER — Other Ambulatory Visit: Payer: Self-pay

## 2020-08-26 ENCOUNTER — Ambulatory Visit (INDEPENDENT_AMBULATORY_CARE_PROVIDER_SITE_OTHER): Payer: Medicare HMO

## 2020-08-26 DIAGNOSIS — Z23 Encounter for immunization: Secondary | ICD-10-CM | POA: Diagnosis not present

## 2020-09-05 MED ORDER — AMLODIPINE BESYLATE 2.5 MG PO TABS: 2.5000 mg | ORAL_TABLET | Freq: Every day | ORAL | 3 refills | Status: DC

## 2020-09-05 NOTE — Addendum Note (Signed)
Addended by: Earnstine Regal on: 09/05/2020 09:02 AM   Modules accepted: Orders

## 2020-09-09 DIAGNOSIS — H6192 Disorder of left external ear, unspecified: Secondary | ICD-10-CM | POA: Insufficient documentation

## 2020-09-11 ENCOUNTER — Ambulatory Visit: Payer: Medicare HMO | Admitting: Cardiology

## 2020-09-13 ENCOUNTER — Ambulatory Visit: Payer: Medicare HMO | Admitting: Cardiology

## 2020-09-13 ENCOUNTER — Other Ambulatory Visit: Payer: Self-pay

## 2020-09-13 ENCOUNTER — Encounter: Payer: Self-pay | Admitting: Cardiology

## 2020-09-13 VITALS — BP 126/70 | HR 60 | Ht 65.0 in | Wt 142.6 lb

## 2020-09-13 DIAGNOSIS — E782 Mixed hyperlipidemia: Secondary | ICD-10-CM

## 2020-09-13 DIAGNOSIS — I10 Essential (primary) hypertension: Secondary | ICD-10-CM

## 2020-09-13 DIAGNOSIS — I2510 Atherosclerotic heart disease of native coronary artery without angina pectoris: Secondary | ICD-10-CM | POA: Diagnosis not present

## 2020-09-13 NOTE — Progress Notes (Signed)
Cardiology Office Note  Date: 09/13/2020   ID: Sergio, Hughes 08/10/1941, MRN [id number]  PCP:  Cassandria Anger, MD  Cardiologist:  Rozann Lesches, MD Electrophysiologist:  None   Chief Complaint  Patient presents with  . Cardiac follow-up    History of Present Illness: Sergio Hughes is a 79 y.o. male last seen in April.  He presents for a routine visit.  No angina symptoms described, no definite change in stamina with ADLs and outdoor work.  Still primary caregiver for his wife with worsening memory.  I reviewed his medications which are stable from a cardiac perspective and outlined below.  His last LDL did come down to 85 on Pravachol.  Past Medical History:  Diagnosis Date  . Coronary artery calcification seen on CT scan   . Hyperlipidemia   . Hypertension   . Osteoarthritis     Past Surgical History:  Procedure Laterality Date  . COLONOSCOPY    . INGUINAL HERNIA REPAIR  2005   Right  . PARTIAL COLECTOMY Right 12/16/2015   Procedure: PARTIAL CECECTOMY AND APPENDECTOMY;  Surgeon: Judeth Horn, MD;  Location: [facility];  Service: General;  Laterality: Right;  . POLYPECTOMY    . VASECTOMY      Current Outpatient Medications  Medication Sig Dispense Refill  . amLODipine (NORVASC) 2.5 MG tablet Take 1 tablet (2.5 mg total) by mouth daily. 90 tablet 3  . aspirin 81 MG EC tablet Take 81 mg by mouth daily.      . Cholecalciferol (VITAMIN D) 2000 UNITS CAPS Take 1 capsule by mouth daily.      . Glucosamine-Chondroit-Vit C-Mn (GLUCOSAMINE 1500 COMPLEX) CAPS Take by mouth.    . Omega-3 Fatty Acids (FISH OIL) 1000 MG CAPS     . pravastatin (PRAVACHOL) 20 MG tablet Take 1 tablet (20 mg total) by mouth daily. 90 tablet 3   No current facility-administered medications for this visit.   Allergies:  Bupropion hcl, Lovastatin, and Pravastatin   ROS:  No syncope.  Physical Exam: VS:  BP 126/70   Pulse 60   Ht 5\' 5"  (1.651 m)   Wt 142 lb 9.6 oz (64.7 kg)   SpO2 98%    BMI 23.73 kg/m , BMI Body mass index is 23.73 kg/m.  Wt Readings from Last 3 Encounters:  09/13/20 142 lb 9.6 oz (64.7 kg)  04/18/20 145 lb (65.8 kg)  03/18/20 149 lb (67.6 kg)    General: Elderly male, appears comfortable at rest. HEENT: Conjunctiva and lids normal, wearing a mask. Neck: Supple, no elevated JVP or carotid bruits, no thyromegaly. Lungs: Clear to auscultation, nonlabored breathing at rest. Cardiac: Regular rate and rhythm, no S3 or significant systolic murmur. Extremities: No pitting edema, distal pulses 2+.  ECG:  An ECG dated 03/18/2020 was personally reviewed today and demonstrated:  Sinus rhythm with right bundle branch block and leftward axis.  Recent Labwork: 04/18/2020: ALT 16; AST 23; TSH 0.50 06/05/2020: BUN 31; Creat 2.01; Potassium 4.4; Sodium 141     Component Value Date/Time   CHOL 160 04/18/2020 1059   TRIG 51.0 04/18/2020 1059   HDL 65.10 04/18/2020 1059   CHOLHDL 2 04/18/2020 1059   VLDL 10.2 04/18/2020 1059   LDLCALC 85 04/18/2020 1059   LDLDIRECT 144.9 11/14/2013 0859    Other Studies Reviewed Today:  Cardiac CT 06/21/2019: FINDINGS: Non-cardiac: See separate report from [facility].  Ascending aorta: Appears enlarged but measures 3.6 cm  Pericardium: Normal  Coronary  arteries: Dense 3 vessel coronary calcification  IMPRESSION: Coronary calcium score of 1709. This was 6 rd percentile for age and sex matched control.  Circumflex hard to score due to dense mitral annular calcification Consider f/u perfusion study given how high score is.  Lexiscan Myoview 09/07/2019:  No diagnostic ST segment changes to indicate ischemia.  Small, mild intensity, apical anteroseptal defect that is partially reversible and suggestive of a mild ischemic territory.  This is a low risk study.  Nuclear stress EF: 69%.  Assessment and Plan:  1.  Multivessel distribution coronary artery calcification by CT imaging with calcium score  1709.  He reports no angina symptoms on medical therapy, Myoview demonstrated apical anteroseptal ischemia which we are following on medical therapy.  Continue aspirin, Norvasc, and Pravachol.  We have discussed warning signs and symptoms that would prompt further invasive cardiac testing.  2.  Mixed hyperlipidemia, continue Pravachol.  3.  Essential hypertension, blood pressure well controlled today.  Continue Norvasc.  Medication Adjustments/Labs and Tests Ordered: Current medicines are reviewed at length with the patient today.  Concerns regarding medicines are outlined above.   Tests Ordered: No orders of the defined types were placed in this encounter.   Medication Changes: No orders of the defined types were placed in this encounter.   Disposition:  Follow up 6 months in the [place] office.  Signed, Satira Sark, MD, [facility] 09/13/2020 11:17 AM    [facility] at [facility] [address] [place], [place] [address] Phone: [phone]; Fax: [phone]

## 2020-09-13 NOTE — Patient Instructions (Signed)
Medication Instructions:  °Your physician recommends that you continue on your current medications as directed. Please refer to the Current Medication list given to you today. ° °*If you need a refill on your cardiac medications before your next appointment, please call your pharmacy* ° ° °Lab Work: °None today °If you have labs (blood work) drawn today and your tests are completely normal, you will receive your results only by: °• MyChart Message (if you have MyChart) OR °• A paper copy in the mail °If you have any lab test that is abnormal or we need to change your treatment, we will call you to review the results. ° ° °Testing/Procedures: °None today ° ° °Follow-Up: °At [facility], you and your health needs are our priority.  As part of our continuing mission to provide you with exceptional heart care, we have created designated Provider Care Teams.  These Care Teams include your primary Cardiologist (physician) and Advanced Practice Providers (APPs -  Physician Assistants and Nurse Practitioners) who all work together to provide you with the care you need, when you need it. ° °We recommend signing up for the patient portal called "MyChart".  Sign up information is provided on this After Visit Summary.  MyChart is used to connect with patients for Virtual Visits (Telemedicine).  Patients are able to view lab/test results, encounter notes, upcoming appointments, etc.  Non-urgent messages can be sent to your provider as well.   °To learn more about what you can do with MyChart, go to [email or web address].   ° °Your next appointment:   °6 month(s) ° °The format for your next appointment:   °In Person ° °Provider:   °Samuel McDowell, MD ° ° °Other Instructions °None ° ° ° ° °Thank you for choosing [facility] ! ° ° ° ° ° ° ° ° °

## 2020-09-17 ENCOUNTER — Encounter (HOSPITAL_BASED_OUTPATIENT_CLINIC_OR_DEPARTMENT_OTHER): Payer: Self-pay | Admitting: Otolaryngology

## 2020-09-17 ENCOUNTER — Other Ambulatory Visit: Payer: Self-pay

## 2020-09-17 NOTE — H&P (Signed)
HPI:   Sergio Hughes is a 79 y.o. male who presents as a consult Patient.   Referring Provider: Janan Hughes*  Chief complaint: Growth on skin of ear.  HPI: He has a dark raised growth on his left earlobe for several months and then more recently an additional hard somewhat papillary lesion is growing off the side of that. No history of skin cancer before. He is not on blood thinner. Incidentally he had a brief episode of loss of balance the other night after trying a new supplement. He stopped taking the supplement but wants to try it again but is going to wait a week or 2.  PMH/Meds/All/SocHx/FamHx/ROS:   History reviewed. No pertinent past medical history.  Past Surgical History:  Procedure Laterality Date  . partial colectomy   No family history of bleeding disorders, wound healing problems or difficulty with anesthesia.   Social History   Socioeconomic History  . Marital status: Married  Spouse name: Not on file  . Number of children: Not on file  . Years of education: Not on file  . Highest education level: Not on file  Occupational History  . Not on file  Tobacco Use  . Smoking status: Former Smoker  Quit date: 1967  Years since quitting: 54.8  . Smokeless tobacco: Never Used  Vaping Use  . Vaping Use: Never used  Substance and Sexual Activity  . Alcohol use: Not on file  . Drug use: Not on file  . Sexual activity: Not on file  Other Topics Concern  . Not on file  Social History Narrative  . Not on file   Social Determinants of Health   Financial Resource Strain:  . Difficulty of Paying Living Expenses: Not on file  Food Insecurity:  . Worried About Charity fundraiser in the Last Year: Not on file  . Ran Out of Food in the Last Year: Not on file  Transportation Needs:  . Lack of Transportation (Medical): Not on file  . Lack of Transportation (Non-Medical): Not on file  Physical Activity:  . Days of Exercise per Week: Not on file  . Minutes of  Exercise per Session: Not on file  Stress:  . Feeling of Stress : Not on file  Social Connections:  . Frequency of Communication with Friends and Family: Not on file  . Frequency of Social Gatherings with Friends and Family: Not on file  . Attends Religious Services: Not on file  . Active Member of Clubs or Organizations: Not on file  . Attends Archivist Meetings: Not on file  . Marital Status: Not on file   Current Outpatient Medications:  . amLODIPine (NORVASC) 2.5 MG tablet, , Disp: , Rfl:  . aspirin 81 MG EC tablet *ANTIPLATELET*, Take 81 mg by mouth daily., Disp: , Rfl:  . cholecalciferol, vitamin D3, 50 mcg (2,000 unit) Cap, Take 1 capsule by mouth daily., Disp: , Rfl:  . docosahexaenoic acid-epa 120-180 mg Cap, , Disp: , Rfl:  . pravastatin (PRAVACHOL) 20 MG tablet, , Disp: , Rfl:   A complete ROS was performed with pertinent positives/negatives noted in the HPI. The remainder of the ROS are negative.   Physical Exam:   BP 128/68  Pulse 67  Temp 96.9 F (36.1 C)  Ht 1.651 m (5\' 5" )  Wt 64.4 kg (142 lb)  BMI 23.63 kg/m   General: Healthy and alert, in no distress, breathing easily. Normal affect. In a pleasant mood. Head: Normocephalic, atraumatic. No  masses, or scars. Eyes: Pupils are equal, and reactive to light. Vision is grossly intact. No spontaneous or gaze nystagmus. Ears: Left external ear reveals an ovoid dark raised lesion of the left earlobe approximately 1 cm in greatest dimension, with a smaller scaly raised almost papillary lesion on one end of that. Ear canals are clear. Tympanic membranes are intact, with normal landmarks and the middle ears are clear and healthy. Hearing: Grossly normal. Nose: Nasal cavities are clear with healthy mucosa, no polyps or exudate. Airways are patent. Face: No masses or scars, facial nerve function is symmetric. Oral Cavity: No mucosal abnormalities are noted. Tongue with normal mobility. Dentition appears  healthy. Oropharynx: Tonsils are symmetric. There are no mucosal masses identified. Tongue base appears normal and healthy. Larynx/Hypopharynx: deferred Chest: Deferred Neck: No palpable masses, no cervical adenopathy, no thyroid nodules or enlargement. Neuro: Cranial nerves II-XII with normal function. Balance: Normal gate. Other findings: none.  Independent Review of Additional Tests or Records:  none  Procedures:  none  Impression & Plans:  Left external ear growth. This could represent sun damage with a small carcinoma growing on the side of it, the new area. Recommend surgical excision with wedge resection and primary closure under local anesthetic at the outpatient surgery center.

## 2020-09-19 ENCOUNTER — Other Ambulatory Visit (HOSPITAL_COMMUNITY): Payer: Medicare HMO

## 2020-09-21 ENCOUNTER — Other Ambulatory Visit (HOSPITAL_COMMUNITY)
Admission: RE | Admit: 2020-09-21 | Discharge: 2020-09-21 | Disposition: A | Discharge status: Home / Self Care | Payer: Medicare HMO | Source: Ambulatory Visit | Attending: Otolaryngology | Admitting: Otolaryngology

## 2020-09-21 DIAGNOSIS — Z20822 Contact with and (suspected) exposure to covid-19: Secondary | ICD-10-CM | POA: Insufficient documentation

## 2020-09-21 DIAGNOSIS — Z01812 Encounter for preprocedural laboratory examination: Secondary | ICD-10-CM | POA: Diagnosis not present

## 2020-09-22 LAB — SARS CORONAVIRUS 2 (TAT 6-24 HRS): SARS Coronavirus 2: NEGATIVE

## 2020-09-23 ENCOUNTER — Encounter (HOSPITAL_BASED_OUTPATIENT_CLINIC_OR_DEPARTMENT_OTHER): Payer: Self-pay | Admitting: Otolaryngology

## 2020-09-23 ENCOUNTER — Encounter (HOSPITAL_BASED_OUTPATIENT_CLINIC_OR_DEPARTMENT_OTHER): Payer: Self-pay | Admitting: Anesthesiology

## 2020-09-23 ENCOUNTER — Encounter (HOSPITAL_BASED_OUTPATIENT_CLINIC_OR_DEPARTMENT_OTHER)
Admission: RE | Disposition: A | Discharge status: Home / Self Care | Payer: Self-pay | Source: Home / Self Care | Attending: Otolaryngology

## 2020-09-23 ENCOUNTER — Ambulatory Visit (HOSPITAL_BASED_OUTPATIENT_CLINIC_OR_DEPARTMENT_OTHER)
Admission: RE | Admit: 2020-09-23 | Discharge: 2020-09-23 | Disposition: A | Discharge status: Home / Self Care | Payer: Medicare HMO | Attending: Otolaryngology | Admitting: Otolaryngology

## 2020-09-23 DIAGNOSIS — H61892 Other specified disorders of left external ear: Secondary | ICD-10-CM | POA: Diagnosis not present

## 2020-09-23 DIAGNOSIS — L821 Other seborrheic keratosis: Secondary | ICD-10-CM | POA: Insufficient documentation

## 2020-09-23 DIAGNOSIS — Z7982 Long term (current) use of aspirin: Secondary | ICD-10-CM | POA: Diagnosis not present

## 2020-09-23 DIAGNOSIS — Z87891 Personal history of nicotine dependence: Secondary | ICD-10-CM | POA: Insufficient documentation

## 2020-09-23 DIAGNOSIS — Z79899 Other long term (current) drug therapy: Secondary | ICD-10-CM | POA: Insufficient documentation

## 2020-09-23 DIAGNOSIS — L989 Disorder of the skin and subcutaneous tissue, unspecified: Secondary | ICD-10-CM | POA: Diagnosis present

## 2020-09-23 HISTORY — PX: LESION REMOVAL: SHX5196

## 2020-09-23 HISTORY — PX: EAR CYST EXCISION: SHX22

## 2020-09-23 SURGERY — MINOR EXCISION OF LESION
Anesthesia: LOCAL | Site: Ear | Laterality: Left

## 2020-09-23 MED ORDER — EPINEPHRINE PF 1 MG/ML IJ SOLN
INTRAMUSCULAR | Status: AC
  Filled 2020-09-23: qty 4

## 2020-09-23 MED ORDER — LIDOCAINE-EPINEPHRINE 1 %-1:100000 IJ SOLN
INTRAMUSCULAR | Status: AC
  Filled 2020-09-23: qty 1

## 2020-09-23 MED ORDER — LIDOCAINE-EPINEPHRINE 1 %-1:100000 IJ SOLN
INTRAMUSCULAR | Status: DC | PRN
  Administered 2020-09-23: 1.5 mL

## 2020-09-23 MED ORDER — BUPIVACAINE HCL (PF) 0.25 % IJ SOLN
INTRAMUSCULAR | Status: AC
  Filled 2020-09-23: qty 30

## 2020-09-23 SURGICAL SUPPLY — 40 items
APPLICATOR COTTON TIP 6 STRL (MISCELLANEOUS) IMPLANT
APPLICATOR COTTON TIP 6IN STRL (MISCELLANEOUS)
BLADE SURG 15 STRL LF DISP TIS (BLADE) ×2 IMPLANT
BLADE SURG 15 STRL SS (BLADE) ×3
CANISTER SUCT 1200ML W/VALVE (MISCELLANEOUS) IMPLANT
CLEANER CAUTERY TIP 5X5 PAD (MISCELLANEOUS) IMPLANT
COTTONBALL LRG STERILE PKG (GAUZE/BANDAGES/DRESSINGS) ×3 IMPLANT
COVER BACK TABLE 60X90IN (DRAPES) ×3 IMPLANT
COVER MAYO STAND STRL (DRAPES) ×3 IMPLANT
COVER WAND RF STERILE (DRAPES) IMPLANT
DECANTER SPIKE VIAL GLASS SM (MISCELLANEOUS) ×3 IMPLANT
DERMABOND ADVANCED (GAUZE/BANDAGES/DRESSINGS) ×1
DERMABOND ADVANCED .7 DNX12 (GAUZE/BANDAGES/DRESSINGS) ×2 IMPLANT
ELECT COATED BLADE 2.86 ST (ELECTRODE) IMPLANT
ELECT NEEDLE BLADE 2-5/6 (NEEDLE) ×3 IMPLANT
ELECT REM PT RETURN 9FT ADLT (ELECTROSURGICAL) ×3
ELECTRODE REM PT RTRN 9FT ADLT (ELECTROSURGICAL) ×2 IMPLANT
GLOVE ECLIPSE 7.5 STRL STRAW (GLOVE) ×3 IMPLANT
GLOVE SURG SS PI 7.0 STRL IVOR (GLOVE) ×3 IMPLANT
GOWN STRL REUS W/ TWL LRG LVL3 (GOWN DISPOSABLE) ×4 IMPLANT
GOWN STRL REUS W/TWL LRG LVL3 (GOWN DISPOSABLE) ×6
IV SET EXT 30 76VOL 4 MALE LL (IV SETS) ×3 IMPLANT
MARKER SKIN DUAL TIP RULER LAB (MISCELLANEOUS) IMPLANT
NEEDLE PRECISIONGLIDE 27X1.5 (NEEDLE) ×3 IMPLANT
NS IRRIG 1000ML POUR BTL (IV SOLUTION) IMPLANT
PACK BASIN DAY SURGERY FS (CUSTOM PROCEDURE TRAY) ×3 IMPLANT
PAD CLEANER CAUTERY TIP 5X5 (MISCELLANEOUS)
PENCIL FOOT CONTROL (ELECTRODE) ×3 IMPLANT
SHEET MEDIUM DRAPE 40X70 STRL (DRAPES) IMPLANT
SPONGE SURGIFOAM ABS GEL 12-7 (HEMOSTASIS) IMPLANT
SUT CHROMIC 3 0 PS 2 (SUTURE) ×3 IMPLANT
SUT CHROMIC 4 0 P 3 18 (SUTURE) IMPLANT
SUT PLAIN 5 0 P 3 18 (SUTURE) IMPLANT
SUT VIC AB 5-0 P-3 18X BRD (SUTURE) IMPLANT
SUT VIC AB 5-0 P3 18 (SUTURE)
SWABSTICK POVIDONE IODINE SNGL (MISCELLANEOUS) IMPLANT
SYR CONTROL 10ML LL (SYRINGE) ×3 IMPLANT
TOWEL GREEN STERILE FF (TOWEL DISPOSABLE) ×3 IMPLANT
TRAY DSU PREP LF (CUSTOM PROCEDURE TRAY) ×3 IMPLANT
TUBE CONNECTING 20X1/4 (TUBING) IMPLANT

## 2020-09-23 NOTE — Op Note (Signed)
OPERATIVE REPORT  DATE OF SURGERY: 09/23/2020  PATIENT:  Sergio Hughes,  79 y.o. male  PRE-OPERATIVE DIAGNOSIS:  Skin lesion of left ear  POST-OPERATIVE DIAGNOSIS:  Skin Lesion of Left Ear  PROCEDURE:  Procedure(s): EXCISION of Left Ear Lesion MINOR EXICISION OF LESION OF LEFT EAR  SURGEON:  Beckie Salts, MD  ASSISTANTS: None  ANESTHESIA:   General   EBL: 20 ml  DRAINS: None  LOCAL MEDICATIONS USED: 1% Xylocaine with epinephrine  SPECIMEN: Left earlobe lesion excision  COUNTS:  Correct  PROCEDURE DETAILS: The patient was taken to the operating room and placed on the operating table in the supine position.  The surgical site was identified in the left earlobe and marked with a marking pen with proposed incisions.  The site was prepped and draped in standard fashion.  1% Xylocaine with epinephrine was infiltrated throughout the entire resection.  A #15 scalpel was used to incise the anterior skin removing the entire wedge of abnormal skin involving the earlobe continued down through subcutaneous fat and into the posterior skin to remove the entire wedge.  The lesion itself was approximately 1.5 cm and the excision was approximately 3 cm.  Electrocautery was used to provide hemostasis.  The wound was then closed in layers using interrupted 3-0 chromic on the deeper layers and interrupted 3-0 chromic in the subcuticular layer.  Dermabond was applied to the skin.  The site was cleaned off and the patient was then transferred back to holding area for discharge home.  He tolerated this well.  The specimen was sent for pathologic evaluation.    PATIENT DISPOSITION:  To PACU, stable

## 2020-09-23 NOTE — Discharge Instructions (Signed)
You may shower and use soap and water. Do not use any creams, oils or ointment. Okay to use Tylenol and Motrin if necessary.

## 2020-09-23 NOTE — Interval H&P Note (Signed)
History and Physical Interval Note:  09/23/2020 8:24 AM  Sergio Hughes  has presented today for surgery, with the diagnosis of Skin lesion of left ear.  The various methods of treatment have been discussed with the patient and family. After consideration of risks, benefits and other options for treatment, the patient has consented to  Procedure(s): EXCISION of Left Ear Lesion (Left) MINOR EXICISION OF LESION OF LEFT EAR (Left) as a surgical intervention.  The patient's history has been reviewed, patient examined, no change in status, stable for surgery.  I have reviewed the patient's chart and labs.  Questions were answered to the patient's satisfaction.     Izora Gala

## 2020-09-24 ENCOUNTER — Encounter (HOSPITAL_BASED_OUTPATIENT_CLINIC_OR_DEPARTMENT_OTHER): Payer: Self-pay | Admitting: Otolaryngology

## 2020-09-24 LAB — SURGICAL PATHOLOGY

## 2020-09-25 ENCOUNTER — Encounter (HOSPITAL_BASED_OUTPATIENT_CLINIC_OR_DEPARTMENT_OTHER): Payer: Self-pay | Admitting: Otolaryngology

## 2020-10-21 ENCOUNTER — Telehealth: Payer: Self-pay

## 2020-10-21 ENCOUNTER — Other Ambulatory Visit: Payer: Self-pay

## 2020-10-21 ENCOUNTER — Ambulatory Visit (INDEPENDENT_AMBULATORY_CARE_PROVIDER_SITE_OTHER): Payer: Medicare HMO

## 2020-10-21 ENCOUNTER — Ambulatory Visit (INDEPENDENT_AMBULATORY_CARE_PROVIDER_SITE_OTHER): Payer: Medicare HMO | Admitting: Internal Medicine

## 2020-10-21 ENCOUNTER — Encounter: Payer: Self-pay | Admitting: Internal Medicine

## 2020-10-21 VITALS — BP 130/76 | HR 63 | Temp 97.8°F | Ht 65.0 in | Wt 144.0 lb

## 2020-10-21 DIAGNOSIS — I2583 Coronary atherosclerosis due to lipid rich plaque: Secondary | ICD-10-CM | POA: Diagnosis not present

## 2020-10-21 DIAGNOSIS — L570 Actinic keratosis: Secondary | ICD-10-CM

## 2020-10-21 DIAGNOSIS — Z0000 Encounter for general adult medical examination without abnormal findings: Secondary | ICD-10-CM

## 2020-10-21 DIAGNOSIS — I2510 Atherosclerotic heart disease of native coronary artery without angina pectoris: Secondary | ICD-10-CM

## 2020-10-21 DIAGNOSIS — N183 Chronic kidney disease, stage 3 unspecified: Secondary | ICD-10-CM | POA: Diagnosis not present

## 2020-10-21 DIAGNOSIS — E785 Hyperlipidemia, unspecified: Secondary | ICD-10-CM

## 2020-10-21 LAB — BASIC METABOLIC PANEL
BUN: 40 mg/dL — ABNORMAL HIGH (ref 6–23)
CO2: 28 mEq/L (ref 19–32)
Calcium: 9.1 mg/dL (ref 8.4–10.5)
Chloride: 104 mEq/L (ref 96–112)
Creatinine, Ser: 1.9 mg/dL — ABNORMAL HIGH (ref 0.40–1.50)
GFR: 33.17 mL/min — ABNORMAL LOW (ref >60.00)
Glucose, Bld: 92 mg/dL (ref 70–99)
Potassium: 4.2 mEq/L (ref 3.5–5.1)
Sodium: 138 mEq/L (ref 135–145)

## 2020-10-21 NOTE — Assessment & Plan Note (Signed)
Pravastatin  

## 2020-10-21 NOTE — Addendum Note (Signed)
Addended by: Boris Lown B on: 10/21/2020 10:11 AM   Modules accepted: Orders

## 2020-10-21 NOTE — Progress Notes (Addendum)
Subjective:  Patient ID: Sergio Hughes, male    DOB: August 07, 1941  Age: 79 y.o. MRN: [id number]  CC: Follow-up   HPI Sergio Hughes presents for CAD, dyslipidemia, HTN, CRF C/o a skin lesion   Outpatient Medications Prior to Visit  Medication Sig Dispense Refill  . amLODipine (NORVASC) 2.5 MG tablet Take 1 tablet (2.5 mg total) by mouth daily. 90 tablet 3  . aspirin 81 MG EC tablet Take 81 mg by mouth daily.      . Cholecalciferol (VITAMIN D) 2000 UNITS CAPS Take 1 capsule by mouth daily.      . Glucosamine-Chondroit-Vit C-Mn (GLUCOSAMINE 1500 COMPLEX) CAPS Take by mouth.    . Omega-3 Fatty Acids (FISH OIL) 1000 MG CAPS     . pravastatin (PRAVACHOL) 20 MG tablet Take 1 tablet (20 mg total) by mouth daily. 90 tablet 3   No facility-administered medications prior to visit.    ROS: Review of Systems  Constitutional: Negative for appetite change, fatigue and unexpected weight change.  HENT: Negative for congestion, nosebleeds, sneezing, sore throat and trouble swallowing.   Eyes: Negative for itching and visual disturbance.  Respiratory: Negative for cough.   Cardiovascular: Negative for chest pain, palpitations and leg swelling.  Gastrointestinal: Negative for abdominal distention, blood in stool, diarrhea and nausea.  Genitourinary: Negative for frequency and hematuria.  Musculoskeletal: Negative for back pain, gait problem, joint swelling and neck pain.  Skin: Negative for rash.  Neurological: Negative for dizziness, tremors, speech difficulty and weakness.  Psychiatric/Behavioral: Negative for agitation, dysphoric mood, sleep disturbance and suicidal ideas. The patient is not nervous/anxious.     Objective:  BP 130/76   Pulse 63   Temp 97.8 F (36.6 C) (Oral)   Ht 5\' 5"  (1.651 m)   Wt 144 lb (65.3 kg)   SpO2 99%   BMI 23.96 kg/m   BP Readings from Last 3 Encounters:  10/21/20 130/76  09/23/20 (!) 151/71  09/13/20 126/70    Wt Readings from Last 3 Encounters:   10/21/20 144 lb (65.3 kg)  09/17/20 142 lb (64.4 kg)  09/13/20 142 lb 9.6 oz (64.7 kg)    Physical Exam Constitutional:      General: He is not in acute distress.    Appearance: He is well-developed.     Comments: NAD  Eyes:     Conjunctiva/sclera: Conjunctivae normal.     Pupils: Pupils are equal, round, and reactive to light.  Neck:     Thyroid: No thyromegaly.     Vascular: No JVD.  Cardiovascular:     Rate and Rhythm: Normal rate and regular rhythm.     Heart sounds: Normal heart sounds. No murmur heard.  No friction rub. No gallop.   Pulmonary:     Effort: Pulmonary effort is normal. No respiratory distress.     Breath sounds: Normal breath sounds. No wheezing or rales.  Chest:     Chest wall: No tenderness.  Abdominal:     General: Bowel sounds are normal. There is no distension.     Palpations: Abdomen is soft. There is no mass.     Tenderness: There is no abdominal tenderness. There is no guarding or rebound.  Musculoskeletal:        General: No tenderness. Normal range of motion.     Cervical back: Normal range of motion.  Lymphadenopathy:     Cervical: No cervical adenopathy.  Skin:    General: Skin is warm and dry.  Findings: No rash.  Neurological:     Mental Status: He is alert and oriented to person, place, and time.     Cranial Nerves: No cranial nerve deficit.     Motor: No abnormal muscle tone.     Coordination: Coordination normal.     Gait: Gait normal.     Deep Tendon Reflexes: Reflexes are normal and symmetric.  Psychiatric:        Behavior: Behavior normal.        Thought Content: Thought content normal.        Judgment: Judgment normal.   AK R temple   Procedure Note :     Procedure : Cryosurgery   Indication:   Actinic keratosis(es)   Risks including unsuccessful procedure , bleeding, infection, bruising, scar, a need for a repeat  procedure and others were explained to the patient in detail as well as the benefits. Informed  consent was obtained verbally.   1  lesion(s)  on  R temple  was/were treated with liquid nitrogen on a Q-tip in a usual fasion . Band-Aid was applied and antibiotic ointment was given for a later use.   Tolerated well. Complications none.   Postprocedure instructions :     Keep the wounds clean. You can wash them with liquid soap and water. Pat dry with gauze or a Kleenex tissue  Before applying antibiotic ointment and a Band-Aid.   You need to report immediately  if  any signs of infection develop.     Lab Results  Component Value Date   WBC 9.5 02/09/2019   HGB 15.4 02/09/2019   HCT 45.2 02/09/2019   PLT 208.0 02/09/2019   GLUCOSE 117 (H) 06/05/2020   CHOL 160 04/18/2020   TRIG 51.0 04/18/2020   HDL 65.10 04/18/2020   LDLDIRECT 144.9 11/14/2013   LDLCALC 85 04/18/2020   ALT 16 04/18/2020   AST 23 04/18/2020   NA 141 06/05/2020   K 4.4 06/05/2020   CL 106 06/05/2020   CREATININE 2.01 (H) 06/05/2020   BUN 31 (H) 06/05/2020   CO2 30 06/05/2020   TSH 0.50 04/18/2020   PSA 6.35 (H) 09/29/2017   INR 1.02 12/09/2015   HGBA1C 5.8 (H) 12/09/2015    No results found.  Assessment & Plan:    Sergio Hughes

## 2020-10-21 NOTE — Telephone Encounter (Signed)
Patient returned phone call to office and we proceeded with AWV via telephone, since we were having issues connecting virtually.

## 2020-10-21 NOTE — Patient Instructions (Signed)
Sergio Hughes , Thank you for taking time to come for your Medicare Wellness Visit. I appreciate your ongoing commitment to your health goals. Please review the following plan we discussed and let me know if I can assist you in the future.   Screening recommendations/referrals: Colonoscopy: 04/15/2015; no repeat due to age Recommended yearly ophthalmology/optometry visit for glaucoma screening and checkup Recommended yearly dental visit for hygiene and checkup  Vaccinations: Influenza vaccine: 08/26/2020 Pneumococcal vaccine: up to date Tdap vaccine: 08/21/2010; due every 10 years Shingles vaccine: never done   Covid-19: up to date with booster  Advanced directives: Please bring a copy of your health care power of attorney and living will to the office at your convenience.  Conditions/risks identified: Yes; Reviewed health maintenance screenings with patient today and relevant education, vaccines, and/or referrals were provided. Please continue to do your personal lifestyle choices by: daily care of teeth and gums, regular physical activity (goal should be 5 days a week for 30 minutes), eat a healthy diet, avoid tobacco and drug use, limiting any alcohol intake, taking a low-dose aspirin (if not allergic or have been advised by your provider otherwise) and taking vitamins and minerals as recommended by your provider. Continue doing brain stimulating activities (puzzles, reading, adult coloring books, staying active) to keep memory sharp. Continue to eat heart healthy diet (full of fruits, vegetables, whole grains, lean protein, water--limit salt, fat, and sugar intake) and increase physical activity as tolerated.  Next appointment: Please schedule your next Medicare Wellness Visit with your Nurse Health Advisor in 1 year by calling [phone].  Preventive Care 13 Years and Older, Male Preventive care refers to lifestyle choices and visits with your health care provider that can promote health and  wellness. What does preventive care include?  A yearly physical exam. This is also called an annual well check.  Dental exams once or twice a year.  Routine eye exams. Ask your health care provider how often you should have your eyes checked.  Personal lifestyle choices, including:  Daily care of your teeth and gums.  Regular physical activity.  Eating a healthy diet.  Avoiding tobacco and drug use.  Limiting alcohol use.  Practicing safe sex.  Taking low doses of aspirin every day.  Taking vitamin and mineral supplements as recommended by your health care provider. What happens during an annual well check? The services and screenings done by your health care provider during your annual well check will depend on your age, overall health, lifestyle risk factors, and family history of disease. Counseling  Your health care provider may ask you questions about your:  Alcohol use.  Tobacco use.  Drug use.  Emotional well-being.  Home and relationship well-being.  Sexual activity.  Eating habits.  History of falls.  Memory and ability to understand (cognition).  Work and work Statistician. Screening  You may have the following tests or measurements:  Height, weight, and BMI.  Blood pressure.  Lipid and cholesterol levels. These may be checked every 5 years, or more frequently if you are over 90 years old.  Skin check.  Lung cancer screening. You may have this screening every year starting at age 86 if you have a 30-pack-year history of smoking and currently smoke or have quit within the past 15 years.  Fecal occult blood test (FOBT) of the stool. You may have this test every year starting at age 87.  Flexible sigmoidoscopy or colonoscopy. You may have a sigmoidoscopy every 5 years or a colonoscopy  every 10 years starting at age 38.  Prostate cancer screening. Recommendations will vary depending on your family history and other risks.  Hepatitis C blood  test.  Hepatitis B blood test.  Sexually transmitted disease (STD) testing.  Diabetes screening. This is done by checking your blood sugar (glucose) after you have not eaten for a while (fasting). You may have this done every 1-3 years.  Abdominal aortic aneurysm (AAA) screening. You may need this if you are a current or former smoker.  Osteoporosis. You may be screened starting at age 23 if you are at high risk. Talk with your health care provider about your test results, treatment options, and if necessary, the need for more tests. Vaccines  Your health care provider may recommend certain vaccines, such as:  Influenza vaccine. This is recommended every year.  Tetanus, diphtheria, and acellular pertussis (Tdap, Td) vaccine. You may need a Td booster every 10 years.  Zoster vaccine. You may need this after age 45.  Pneumococcal 13-valent conjugate (PCV13) vaccine. One dose is recommended after age 45.  Pneumococcal polysaccharide (PPSV23) vaccine. One dose is recommended after age 63. Talk to your health care provider about which screenings and vaccines you need and how often you need them. This information is not intended to replace advice given to you by your health care provider. Make sure you discuss any questions you have with your health care provider. Document Released: 12/06/2015 Document Revised: 07/29/2016 Document Reviewed: 09/10/2015 Elsevier Interactive Patient Education  2017 [employer] Prevention in the Home Falls can cause injuries. They can happen to people of all ages. There are many things you can do to make your home safe and to help prevent falls. What can I do on the outside of my home?  Regularly fix the edges of walkways and driveways and fix any cracks.  Remove anything that might make you trip as you walk through a door, such as a raised step or threshold.  Trim any bushes or trees on the path to your home.  Use bright outdoor  lighting.  Clear any walking paths of anything that might make someone trip, such as rocks or tools.  Regularly check to see if handrails are loose or broken. Make sure that both sides of any steps have handrails.  Any raised decks and porches should have guardrails on the edges.  Have any leaves, snow, or ice cleared regularly.  Use sand or salt on walking paths during winter.  Clean up any spills in your garage right away. This includes oil or grease spills. What can I do in the bathroom?  Use night lights.  Install grab bars by the toilet and in the tub and shower. Do not use towel bars as grab bars.  Use non-skid mats or decals in the tub or shower.  If you need to sit down in the shower, use a plastic, non-slip stool.  Keep the floor dry. Clean up any water that spills on the floor as soon as it happens.  Remove soap buildup in the tub or shower regularly.  Attach bath mats securely with double-sided non-slip rug tape.  Do not have throw rugs and other things on the floor that can make you trip. What can I do in the bedroom?  Use night lights.  Make sure that you have a light by your bed that is easy to reach.  Do not use any sheets or blankets that are too big for your bed. They should  not hang down onto the floor.  Have a firm chair that has side arms. You can use this for support while you get dressed.  Do not have throw rugs and other things on the floor that can make you trip. What can I do in the kitchen?  Clean up any spills right away.  Avoid walking on wet floors.  Keep items that you use a lot in easy-to-reach places.  If you need to reach something above you, use a strong step stool that has a grab bar.  Keep electrical cords out of the way.  Do not use floor polish or wax that makes floors slippery. If you must use wax, use non-skid floor wax.  Do not have throw rugs and other things on the floor that can make you trip. What can I do with my  stairs?  Do not leave any items on the stairs.  Make sure that there are handrails on both sides of the stairs and use them. Fix handrails that are broken or loose. Make sure that handrails are as long as the stairways.  Check any carpeting to make sure that it is firmly attached to the stairs. Fix any carpet that is loose or worn.  Avoid having throw rugs at the top or bottom of the stairs. If you do have throw rugs, attach them to the floor with carpet tape.  Make sure that you have a light switch at the top of the stairs and the bottom of the stairs. If you do not have them, ask someone to add them for you. What else can I do to help prevent falls?  Wear shoes that:  Do not have high heels.  Have rubber bottoms.  Are comfortable and fit you well.  Are closed at the toe. Do not wear sandals.  If you use a stepladder:  Make sure that it is fully opened. Do not climb a closed stepladder.  Make sure that both sides of the stepladder are locked into place.  Ask someone to hold it for you, if possible.  Clearly mark and make sure that you can see:  Any grab bars or handrails.  First and last steps.  Where the edge of each step is.  Use tools that help you move around (mobility aids) if they are needed. These include:  Canes.  Walkers.  Scooters.  Crutches.  Turn on the lights when you go into a dark area. Replace any light bulbs as soon as they burn out.  Set up your furniture so you have a clear path. Avoid moving your furniture around.  If any of your floors are uneven, fix them.  If there are any pets around you, be aware of where they are.  Review your medicines with your doctor. Some medicines can make you feel dizzy. This can increase your chance of falling. Ask your doctor what other things that you can do to help prevent falls. This information is not intended to replace advice given to you by your health care provider. Make sure you discuss any  questions you have with your health care provider. Document Released: 09/05/2009 Document Revised: 04/16/2016 Document Reviewed: 12/14/2014 Elsevier Interactive Patient Education  2017 [employer].

## 2020-10-21 NOTE — Assessment & Plan Note (Signed)
Labs

## 2020-10-21 NOTE — Progress Notes (Addendum)
I connected with Sergio Hughes today by telephone and verified that I am speaking with the correct person using two identifiers. Location patient: home Location provider: work Persons participating in the virtual visit: Sergio Hughes and Sergio Abu, LPN.   I discussed the limitations, risks, security and privacy concerns of performing an evaluation and management service by telephone and the availability of in person appointments. I also discussed with the patient that there may be a patient responsible charge related to this service. The patient expressed understanding and verbally consented to this telephonic visit.    Interactive audio and video telecommunications were attempted between this provider and patient, however failed, due to patient having technical difficulties OR patient did not have access to video capability.  We continued and completed visit with audio only.  Some vital signs may be absent or patient reported.   Time Spent with patient on telephone encounter: 30 minutes  Subjective:   Sergio Hughes is a 79 y.o. male who presents for Medicare Annual/Subsequent preventive examination.  Review of Systems    No ROS. Medicare Wellness Visit. Additional risk factors are reflected in social history. Cardiac Risk Factors include: advanced age (>12men, >52 women);dyslipidemia;family history of premature cardiovascular disease;hypertension;male gender Sleep Patterns: No sleep issues, feels rested on waking and sleeps 7.5 hours nightly. Home Safety/Smoke Alarms: Feels safe in home; uses home alarm. Smoke alarms in place. Living environment: 2-story home; Lives with spouse; no needs for DME; good support system. Seat Belt Safety/Bike Helmet: Wears seat belt.    Objective:    Today's Vitals   10/21/20 1240 10/21/20 1257  BP:  130/76  Pulse:  63  Temp:  97.8 F (36.6 C)  SpO2:  99%  Weight:  144 lb (65.3 kg)  Height:  5\' 5"  (1.651 m)  PainSc: 0-No pain 0-No pain   Body  mass index is 23.96 kg/m.  Advanced Directives 10/21/2020 09/17/2020 07/12/2018 12/17/2015 12/09/2015 04/15/2015 04/01/2015  Does Patient Have a Medical Advance Directive? Yes Yes Yes No Yes Yes Yes  Type of Advance Directive - Living will;Healthcare Power of [place];Living will - [place];Living will Healthcare Power of [place];Living will  Does patient want to make changes to medical advance directive? No - Patient declined No - Patient declined - - No - Patient declined - -  Copy of [place] in Chart? - - - - No - copy requested - -    Current Medications (verified) Outpatient Encounter Medications as of 10/21/2020  Medication Sig   amLODipine (NORVASC) 2.5 MG tablet Take 1 tablet (2.5 mg total) by mouth daily.   aspirin 81 MG EC tablet Take 81 mg by mouth daily.     Cholecalciferol (VITAMIN D) 2000 UNITS CAPS Take 1 capsule by mouth daily.     Glucosamine-Chondroit-Vit C-Mn (GLUCOSAMINE 1500 COMPLEX) CAPS Take by mouth.   Omega-3 Fatty Acids (FISH OIL) 1000 MG CAPS    pravastatin (PRAVACHOL) 20 MG tablet Take 1 tablet (20 mg total) by mouth daily.   No facility-administered encounter medications on file as of 10/21/2020.    Allergies (verified) Bupropion hcl and Lovastatin   History: Past Medical History:  Diagnosis Date   Coronary artery calcification seen on CT scan    Hyperlipidemia    Hypertension    Osteoarthritis    Past Surgical History:  Procedure Laterality Date   COLON SURGERY     COLONOSCOPY     INGUINAL  HERNIA REPAIR  2005   Right   LESION REMOVAL Left 09/23/2020   Procedure: MINOR EXICISION OF LESION OF LEFT EAR;  Surgeon: Izora Gala, MD;  Location: [facility];  Service: ENT;  Laterality: Left;   PARTIAL COLECTOMY Right 12/16/2015   Procedure: PARTIAL CECECTOMY AND APPENDECTOMY;  Surgeon: Judeth Horn, MD;  Location: [facility];  Service: General;   Laterality: Right;   POLYPECTOMY     VASECTOMY     Family History  Problem Relation Age of Onset   Hypertension Father    Cancer Father        brain ca   Prostate cancer Brother 29   Colon cancer Neg Hx    Rectal cancer Neg Hx    Stomach cancer Neg Hx    Esophageal cancer Neg Hx    Social History   Socioeconomic History   Marital status: Married    Spouse name: Herndon Grill   Number of children: Not on file   Years of education: college    Highest education level: Not on file  Occupational History   Occupation: retired  Tobacco Use   Smoking status: Former Smoker    Types: Cigarettes    Quit date: 12/14/1965    Years since quitting: 54.8   Smokeless tobacco: Never Used  Vaping Use   Vaping Use: Never used  Substance and Sexual Activity   Alcohol use: Yes    Alcohol/week: 21.0 standard drinks    Types: 7 Glasses of wine, 14 Standard drinks or equivalent per week    Comment: 1-2 glasses of wine or beer each night   Drug use: No   Sexual activity: Not Currently  Other Topics Concern   Not on file  Social History Narrative   Regular exercise - YES - golf   Social Determinants of Health   Financial Resource Strain: Low Risk    Difficulty of Paying Living Expenses: Not hard at all  Food Insecurity: No Food Insecurity   Worried About Charity fundraiser in the Last Year: Never true   Ran Out of Food in the Last Year: Never true  Transportation Needs: No Transportation Needs   Lack of Transportation (Medical): No   Lack of Transportation (Non-Medical): No  Physical Activity: Sufficiently Active   Days of Exercise per Week: 5 days   Minutes of Exercise per Session: 30 min  Stress: No Stress Concern Present   Feeling of Stress : Not at all  Social Connections: Socially Integrated   Frequency of Communication with Friends and Family: More than three times a week   Frequency of Social Gatherings with Friends and Family: Once a week   Attends Religious Services: More  than 4 times per year   Active Member of [employer] or Organizations: Yes   Attends Music therapist: More than 4 times per year   Marital Status: Married    Tobacco Counseling Counseling given: Not Answered   Clinical Intake:  Pre-visit preparation completed: Yes  Pain : No/denies pain Pain Score: 0-No pain     BMI - recorded: 23.96 Nutritional Status: BMI of 19-24  Normal Nutritional Risks: None Diabetes: No  How often do you need to have someone help you when you read instructions, pamphlets, or other written materials from your doctor or pharmacy?: 1 - Never What is the last grade level you completed in school?: Bachelor's Degree in Mathematics  Diabetic? no  Interpreter Needed?: No  Information entered by :: Sergio Abu, LPN.  Activities of Daily Living In your present state of health, do you have any difficulty performing the following activities: 10/21/2020 10/21/2020  Hearing? N Y  Vision? N N  Difficulty concentrating or making decisions? N N  Walking or climbing stairs? N N  Dressing or bathing? N N  Doing errands, shopping? N N  Preparing Food and eating ? N -  Using the Toilet? N -  In the past six months, have you accidently leaked urine? N -  Do you have problems with loss of bowel control? N -  Managing your Medications? N -  Managing your Finances? N -  Housekeeping or managing your Housekeeping? N -  Some recent data might be hidden    Patient Care Team: Plotnikov, [name], MD as PCP - General Domenic Polite Aloha Gell, MD as PCP - Cardiology (Cardiology) Milus Banister, MD as Attending Physician (Gastroenterology) Satira Sark, MD as Consulting Physician (Cardiology)  Indicate any recent Medical Services you may have received from other than [facility] providers in the past year (date may be approximate).     Assessment:   This is a routine wellness examination for [place].  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       Depression Screen PHQ 2/9 Scores 10/21/2020 08/15/2019 02/07/2018 12/08/2016 12/06/2015  PHQ - 2 Score 0 0 0 0 0    Fall Risk Fall Risk  10/21/2020 10/21/2020 08/15/2019 02/07/2018 12/08/2016  Falls in the past year? 0 0 0 No No  Number falls in past yr: 0 0 - - -  Injury with Fall? 0 0 - - -  Risk for fall due to : No Fall Risks - - - -  Follow up Falls evaluation completed - Falls evaluation completed - -    Any stairs in or around the home? Yes  If so, are there any without handrails? No  Home free of loose throw rugs in walkways, pet beds, electrical cords, etc? Yes  Adequate lighting in your home to reduce risk of falls? Yes   ASSISTIVE DEVICES UTILIZED TO PREVENT FALLS:  Life alert? No  Use of a cane, walker or w/c? No  Grab bars in the bathroom? Yes  Shower chair or bench in shower? Yes  Elevated toilet seat or a handicapped toilet? No   TIMED UP AND GO:  Was the test performed? No .  Length of time to ambulate 10 feet: 0 sec.   Gait steady and fast without use of assistive device  Cognitive Function: Patient is cogitatively intact.        Immunizations Immunization History  Administered Date(s) Administered   Fluad Quad(high Dose 65+) 07/25/2019, 08/26/2020   Influenza Whole 08/20/2009, 08/21/2010   Influenza, High Dose Seasonal PF 09/28/2016, 09/24/2017, 08/10/2018   Influenza, Seasonal, Injecte, Preservative Fre 11/11/2012   Influenza,inj,Quad PF,6+ Mos 11/20/2014    Influenza-Unspecified 11/07/2013, 11/08/2015   PFIZER SARS-COV-2 Vaccination 12/13/2019, 12/31/2019, 08/22/2020   Pneumococcal Conjugate-13 11/14/2013   Pneumococcal Polysaccharide-23 09/04/2010   Td 08/21/2010   Zoster 11/20/2014    TDAP status: Up  to date Flu Vaccine status: Up to date Pneumococcal vaccine status: Up to date Covid-19 vaccine status: Completed vaccines  Qualifies for Shingles Vaccine? Yes   Zostavax completed Yes   Shingrix Completed?: No.    Education has been provided regarding the importance of this vaccine. Patient has been advised to call insurance company to determine out of pocket expense if they have not yet received this vaccine. Advised may also receive vaccine at local pharmacy or Health Dept. Verbalized acceptance and understanding.  Screening Tests Health Maintenance  Topic Date Due   Hepatitis C Screening  Never done   COLONOSCOPY  04/14/2018   TETANUS/TDAP  08/21/2020   INFLUENZA VACCINE  Completed   COVID-19 Vaccine  Completed   PNA vac Low Risk Adult  Completed    Health Maintenance  Health Maintenance Due  Topic Date Due   Hepatitis C Screening  Never done   COLONOSCOPY  04/14/2018   TETANUS/TDAP  08/21/2020    Colorectal cancer screening: No longer required.   Lung Cancer Screening: (Low Dose CT Chest recommended if Age 61-80 years, 30 pack-year currently smoking OR have quit w/in 15years.) does not qualify.   Lung Cancer Screening Referral: no  Additional Screening:  Hepatitis C Screening: does qualify; Completed no  Vision Screening: Recommended annual ophthalmology exams for early detection of glaucoma and other disorders of the eye. Is the patient up to date with their annual eye exam?  Yes  Who is the provider or what is the name of the office in which the patient attends annual eye exams? Shon Hough, MD. If pt is not established with a provider, would they like to be referred to a provider to establish care? No .    Dental Screening: Recommended annual dental exams for proper oral hygiene  Community Resource Referral / Chronic Care Management: CRR required this visit?  No   CCM required this visit?  No      Plan:     I have personally reviewed and noted the following in the patient's chart:   Medical and social history Use of alcohol, tobacco or illicit drugs  Current medications and supplements Functional ability and status Nutritional status Physical activity Advanced directives List of other physicians Hospitalizations, surgeries, and ER visits in previous 12 months Vitals Screenings to include cognitive, depression, and falls Referrals and appointments  In addition, I have reviewed and discussed with patient certain preventive protocols, quality metrics, and best practice recommendations. A written personalized care plan for preventive services as well as general preventive health recommendations were provided to patient.     Sheral Flow, LPN   25/03/3975   Nurse Notes:  Patient is cogitatively intact. There were no vitals filed for this visit. There is no height or weight on file to calculate BMI. Patient stated that he has no issues with gait or balance; does not use any assistive devices.   Medical screening examination/treatment/procedure(s) were performed by non-physician practitioner and as supervising physician I was immediately available for consultation/collaboration.  I agree with above. Lew Dawes, MD

## 2020-10-21 NOTE — Assessment & Plan Note (Signed)
ASA 81 Pravastatin

## 2021-01-20 DIAGNOSIS — R809 Proteinuria, unspecified: Secondary | ICD-10-CM | POA: Diagnosis not present

## 2021-01-20 DIAGNOSIS — N1832 Chronic kidney disease, stage 3b: Secondary | ICD-10-CM | POA: Diagnosis not present

## 2021-01-20 DIAGNOSIS — I129 Hypertensive chronic kidney disease with stage 1 through stage 4 chronic kidney disease, or unspecified chronic kidney disease: Secondary | ICD-10-CM | POA: Diagnosis not present

## 2021-02-14 ENCOUNTER — Other Ambulatory Visit: Payer: Self-pay

## 2021-02-17 ENCOUNTER — Ambulatory Visit (INDEPENDENT_AMBULATORY_CARE_PROVIDER_SITE_OTHER): Payer: Medicare HMO | Admitting: Internal Medicine

## 2021-02-17 ENCOUNTER — Encounter: Payer: Self-pay | Admitting: Internal Medicine

## 2021-02-17 ENCOUNTER — Other Ambulatory Visit: Payer: Self-pay

## 2021-02-17 DIAGNOSIS — I2583 Coronary atherosclerosis due to lipid rich plaque: Secondary | ICD-10-CM

## 2021-02-17 DIAGNOSIS — R252 Cramp and spasm: Secondary | ICD-10-CM | POA: Diagnosis not present

## 2021-02-17 DIAGNOSIS — I2510 Atherosclerotic heart disease of native coronary artery without angina pectoris: Secondary | ICD-10-CM | POA: Diagnosis not present

## 2021-02-17 DIAGNOSIS — N183 Chronic kidney disease, stage 3 unspecified: Secondary | ICD-10-CM

## 2021-02-17 DIAGNOSIS — E785 Hyperlipidemia, unspecified: Secondary | ICD-10-CM | POA: Diagnosis not present

## 2021-02-17 DIAGNOSIS — Z23 Encounter for immunization: Secondary | ICD-10-CM | POA: Diagnosis not present

## 2021-02-17 NOTE — Assessment & Plan Note (Addendum)
Cont with Pravastatin  Try magnesium for hand cramps Pt had labs w/nephrology

## 2021-02-17 NOTE — Progress Notes (Signed)
Works with current temperature that you were waiting for   Subjective:  Patient ID: Sergio Hughes, male    DOB: 15-Oct-1941  Age: 80 y.o. MRN: [id number]  CC: Follow-up (4 month f/u)   HPI Sergio Hughes presents for CAD, HTN, dyslipidemia  Saw Dr Joelyn Oms 1 mo ago - had labs C/o hand cramps at times  Outpatient Medications Prior to Visit  Medication Sig Dispense Refill  . amLODipine (NORVASC) 2.5 MG tablet Take 1 tablet (2.5 mg total) by mouth daily. 90 tablet 3  . aspirin 81 MG EC tablet Take 81 mg by mouth daily.    . Cholecalciferol (VITAMIN D) 2000 UNITS CAPS Take 1 capsule by mouth daily.    . Glucosamine-Chondroit-Vit C-Mn (GLUCOSAMINE 1500 COMPLEX) CAPS Take by mouth.    . Omega-3 Fatty Acids (FISH OIL) 1000 MG CAPS     . pravastatin (PRAVACHOL) 20 MG tablet Take 1 tablet (20 mg total) by mouth daily. 90 tablet 3   No facility-administered medications prior to visit.    ROS: Review of Systems  Constitutional: Negative for appetite change, fatigue and unexpected weight change.  HENT: Negative for congestion, nosebleeds, sneezing, sore throat and trouble swallowing.   Eyes: Negative for itching and visual disturbance.  Respiratory: Negative for cough.   Cardiovascular: Negative for chest pain, palpitations and leg swelling.  Gastrointestinal: Negative for abdominal distention, blood in stool, diarrhea and nausea.  Genitourinary: Negative for frequency and hematuria.  Musculoskeletal: Positive for myalgias. Negative for back pain, gait problem, joint swelling and neck pain.  Skin: Negative for rash.  Neurological: Negative for dizziness, tremors, speech difficulty and weakness.  Psychiatric/Behavioral: Negative for agitation, dysphoric mood and sleep disturbance. The patient is not nervous/anxious.     Objective:  There were no vitals taken for this visit.  BP Readings from Last 3 Encounters:  10/21/20 130/76  10/21/20 130/76  09/23/20 (!) 151/71    Wt Readings from  Last 3 Encounters:  10/21/20 144 lb (65.3 kg)  10/21/20 144 lb (65.3 kg)  09/17/20 142 lb (64.4 kg)    Physical Exam Constitutional:      General: He is not in acute distress.    Appearance: He is well-developed.     Comments: NAD  Eyes:     Conjunctiva/sclera: Conjunctivae normal.     Pupils: Pupils are equal, round, and reactive to light.  Neck:     Thyroid: No thyromegaly.     Vascular: No JVD.  Cardiovascular:     Rate and Rhythm: Normal rate and regular rhythm.     Heart sounds: Normal heart sounds. No murmur heard. No friction rub. No gallop.   Pulmonary:     Effort: Pulmonary effort is normal. No respiratory distress.     Breath sounds: Normal breath sounds. No wheezing or rales.  Chest:     Chest wall: No tenderness.  Abdominal:     General: Bowel sounds are normal. There is no distension.     Palpations: Abdomen is soft. There is no mass.     Tenderness: There is no abdominal tenderness. There is no guarding or rebound.  Musculoskeletal:        General: No tenderness. Normal range of motion.     Cervical back: Normal range of motion.  Lymphadenopathy:     Cervical: No cervical adenopathy.  Skin:    General: Skin is warm and dry.     Findings: No rash.  Neurological:     Mental Status: He is  alert and oriented to person, place, and time.     Cranial Nerves: No cranial nerve deficit.     Motor: No abnormal muscle tone.     Coordination: Coordination normal.     Gait: Gait normal.     Deep Tendon Reflexes: Reflexes are normal and symmetric.  Psychiatric:        Behavior: Behavior normal.        Thought Content: Thought content normal.        Judgment: Judgment normal.     Lab Results  Component Value Date   WBC 9.5 02/09/2019   HGB 15.4 02/09/2019   HCT 45.2 02/09/2019   PLT 208.0 02/09/2019   GLUCOSE 92 10/21/2020   CHOL 160 04/18/2020   TRIG 51.0 04/18/2020   HDL 65.10 04/18/2020   LDLDIRECT 144.9 11/14/2013   LDLCALC 85 04/18/2020   ALT 16  04/18/2020   AST 23 04/18/2020   NA 138 10/21/2020   K 4.2 10/21/2020   CL 104 10/21/2020   CREATININE 1.90 (H) 10/21/2020   BUN 40 (H) 10/21/2020   CO2 28 10/21/2020   TSH 0.50 04/18/2020   PSA 6.35 (H) 09/29/2017   INR 1.02 12/09/2015   HGBA1C 5.8 (H) 12/09/2015    No results found.  Assessment & Plan:   Walker Kehr, MD

## 2021-02-17 NOTE — Assessment & Plan Note (Signed)
?  due to Pravastatin vs other Try magnesium Pt has labs w/nephrology

## 2021-02-17 NOTE — Assessment & Plan Note (Signed)
Cont w/Pravastatin

## 2021-02-17 NOTE — Assessment & Plan Note (Signed)
F/u w/Dr Joelyn Oms q 6 mo

## 2021-02-28 ENCOUNTER — Other Ambulatory Visit: Payer: Self-pay | Admitting: Internal Medicine

## 2021-03-09 IMAGING — CT CT HEART SCORING
2 series · 16 of 20 positions shown, 18 images · non-contrast
Comparison: None.
COMPARISON: None.

Addendum:
EXAM:
OVER-READ INTERPRETATION  CT CHEST

The following report is an over-read performed by radiologist Dr.
Lkw Tiger [REDACTED] on 06/21/2019. This
over-read does not include interpretation of cardiac or coronary
anatomy or pathology. The coronary calcium score/coronary CTA
interpretation by the cardiologist is attached.
CLINICAL DATA: Risk stratification
Coronary Calcium Score
TECHNIQUE: The patient was scanned on a Siemens Somatom 64 slice scanner. Axial
non-contrast 3 mm slices were carried out through the heart. The
data set was analyzed on a dedicated work station and scored using
the Agatson method.

[Series 2: casc 3.0 i36f 2 bestdiast 69 % · axial · 0.30mm/px · z∈[-234,-120]mm · 8 of 50 slices shown, 10 images]
[im 6/50  vessel]
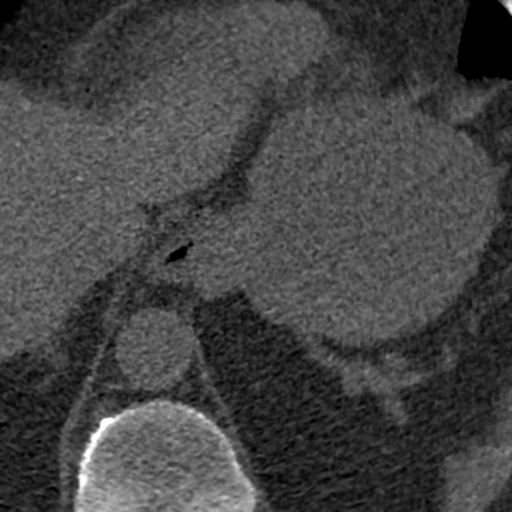
[im 6/50  lung]
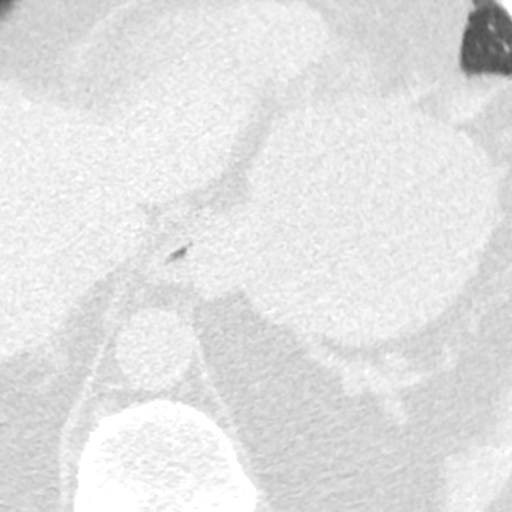
[im 11/50  vessel]
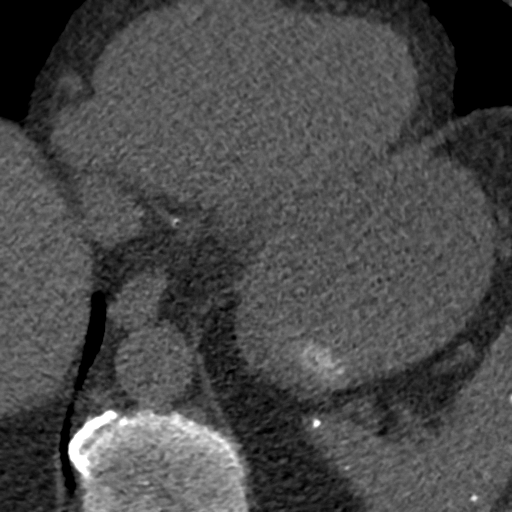
[im 17/50  vessel]
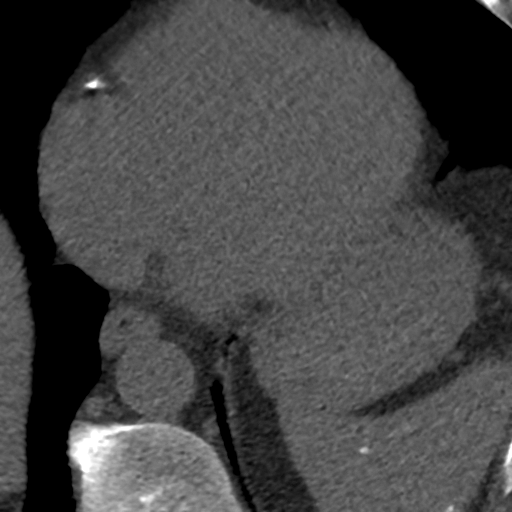
[im 22/50  vessel]
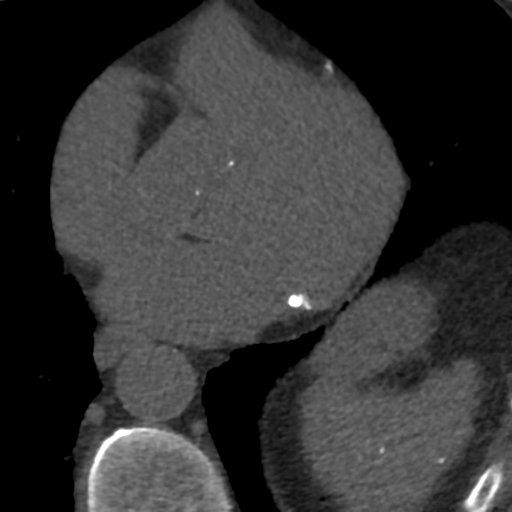
[im 28/50  vessel]
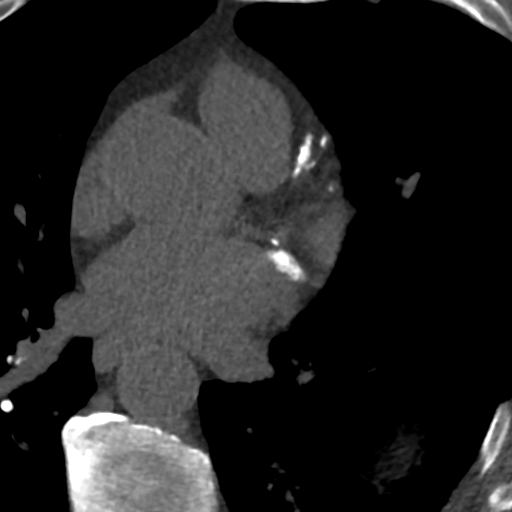
[im 28/50  lung]
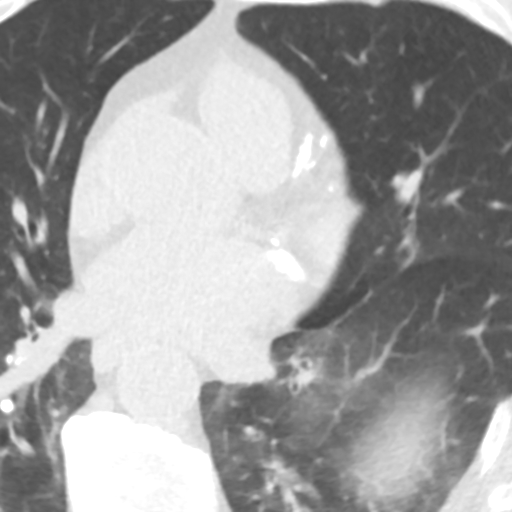
[im 33/50  vessel]
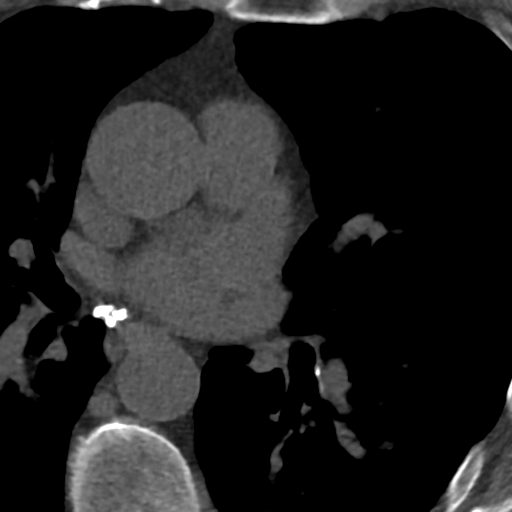
[im 39/50  vessel]
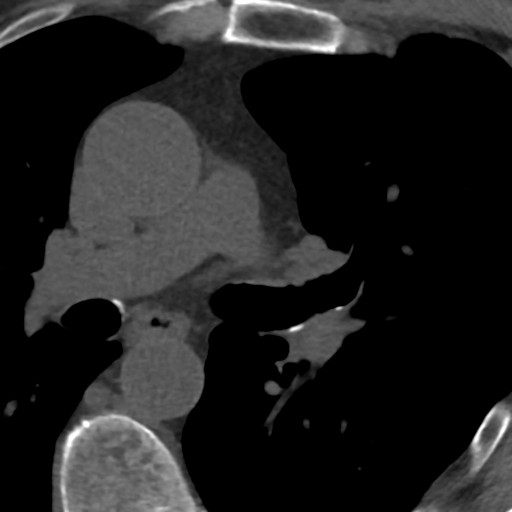
[im 44/50  vessel]
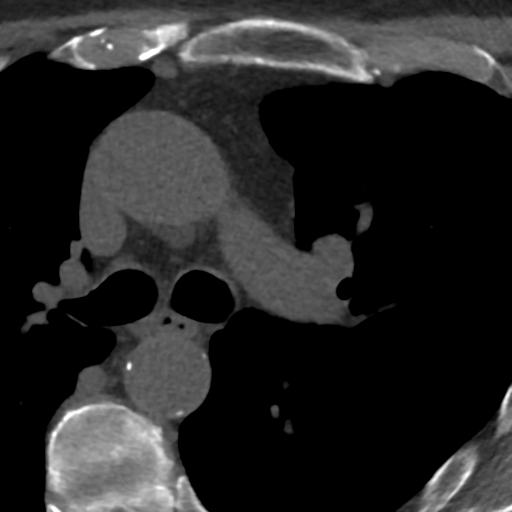

[Series 4: lung st 69 % · axial · 0.60mm/px · z∈[-234,-120]mm · 8 of 50 slices shown]
[im 6/50  lung]
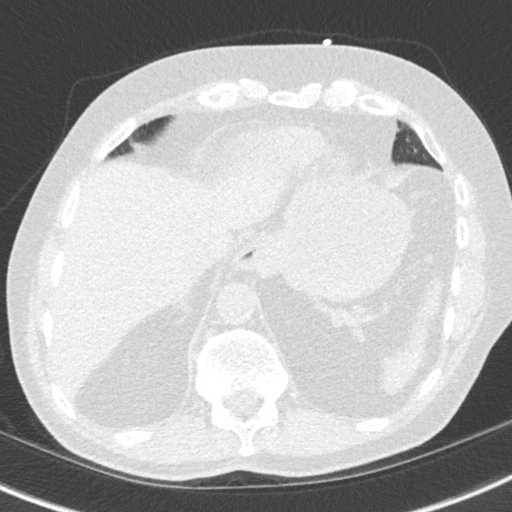
[im 11/50  lung]
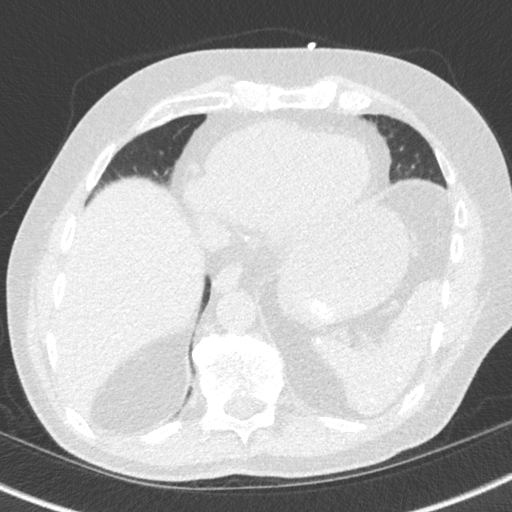
[im 17/50  lung]
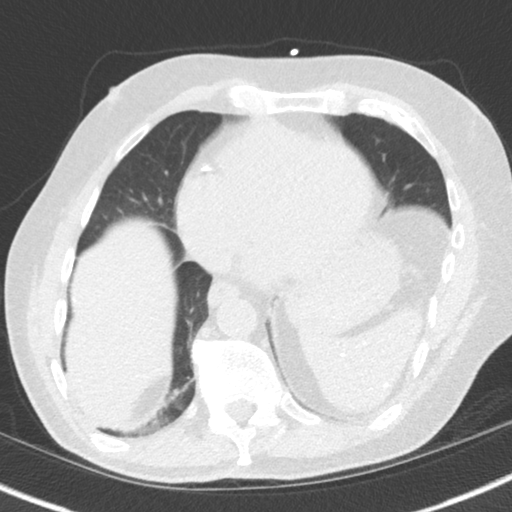
[im 22/50  lung]
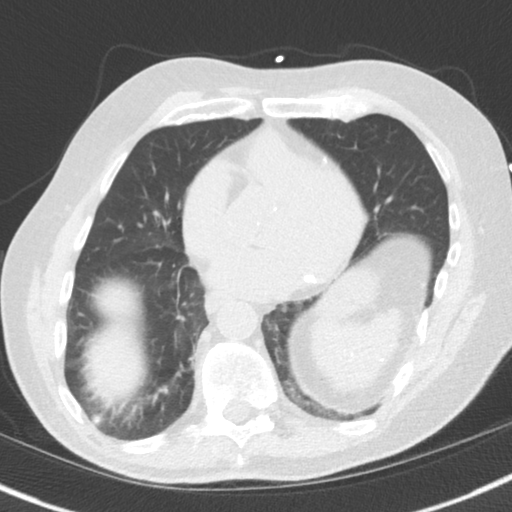
[im 28/50  lung]
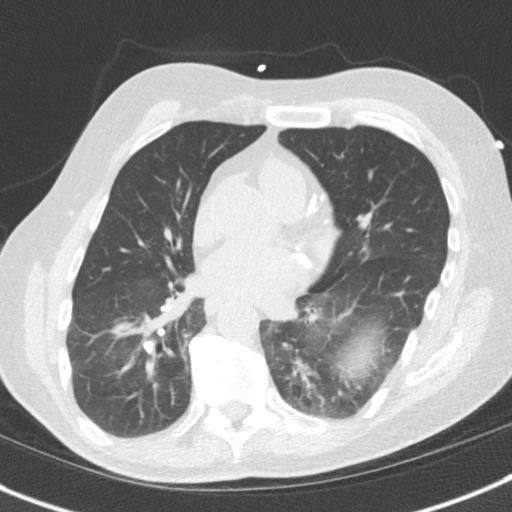
[im 33/50  lung]
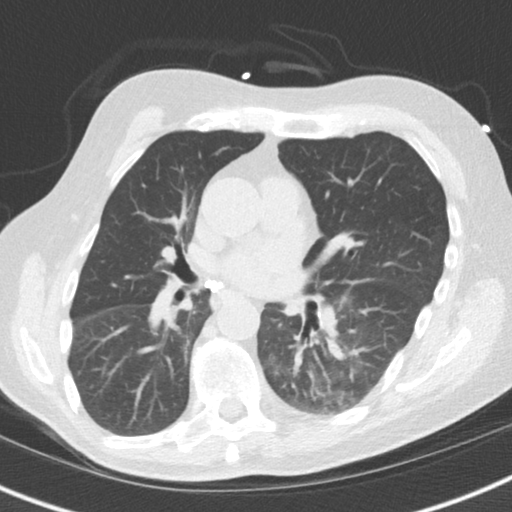
[im 39/50  lung]
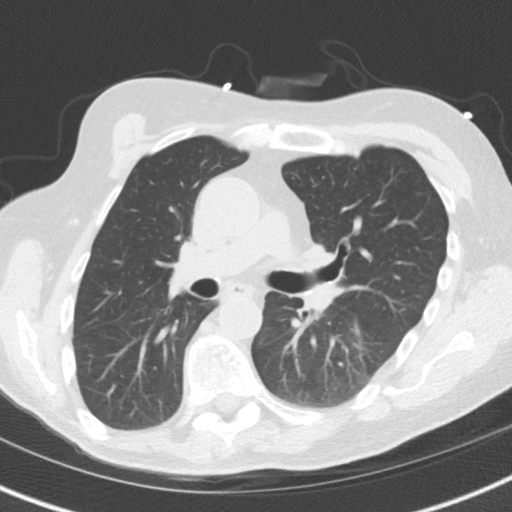
[im 44/50  lung]
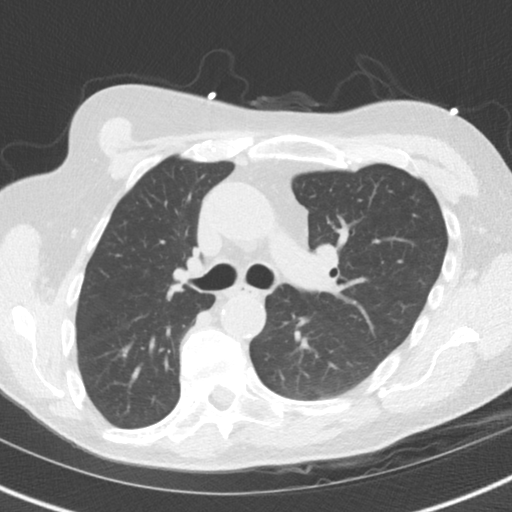

[16 of 20 positions shown; findings below may reference images not displayed]

FINDINGS: Limited view of the lung parenchyma demonstrates benign calcified
nodule in the RIGHT lower lobe. Mild ground-glass opacities in the
bilateral lung bases most suggestive of atelectasis. Airways are
normal.

Limited view of the mediastinum demonstrates no adenopathy.
Esophagus normal.

Limited view of the upper abdomen unremarkable. Benign granulomata
noted within the liver and spleen.

Limited view of the skeleton and chest wall is unremarkable.
IMPRESSION: Evidence of benign granulomatous disease.
FINDINGS: Non-cardiac: See separate report from [REDACTED].

Ascending aorta: Appears enlarged but measures 3.6 cm

Pericardium: Normal

Coronary arteries: Dense 3 vessel coronary calcification
IMPRESSION: Coronary calcium score of 5369. This was 83 [REDACTED] percentile for age
and sex matched control.

Circumflex hard to score due to dense mitral annular calcification
Consider f/u perfusion study given

How high score is

Yaiz Eimy

*** End of Addendum ***
EXAM:
OVER-READ INTERPRETATION  CT CHEST

The following report is an over-read performed by radiologist Dr.
Lkw Tiger [REDACTED] on 06/21/2019. This
over-read does not include interpretation of cardiac or coronary
anatomy or pathology. The coronary calcium score/coronary CTA
interpretation by the cardiologist is attached.
FINDINGS: Limited view of the lung parenchyma demonstrates benign calcified
nodule in the RIGHT lower lobe. Mild ground-glass opacities in the
bilateral lung bases most suggestive of atelectasis. Airways are
normal.

Limited view of the mediastinum demonstrates no adenopathy.
Esophagus normal.

Limited view of the upper abdomen unremarkable. Benign granulomata
noted within the liver and spleen.

Limited view of the skeleton and chest wall is unremarkable.
IMPRESSION: Evidence of benign granulomatous disease.

## 2021-08-27 ENCOUNTER — Ambulatory Visit (INDEPENDENT_AMBULATORY_CARE_PROVIDER_SITE_OTHER): Payer: Medicare HMO | Admitting: *Deleted

## 2021-08-27 DIAGNOSIS — Z23 Encounter for immunization: Secondary | ICD-10-CM | POA: Diagnosis not present

## 2021-09-05 ENCOUNTER — Other Ambulatory Visit: Payer: Self-pay | Admitting: Internal Medicine

## 2021-10-23 ENCOUNTER — Ambulatory Visit (INDEPENDENT_AMBULATORY_CARE_PROVIDER_SITE_OTHER): Payer: Medicare HMO | Admitting: Internal Medicine

## 2021-10-23 ENCOUNTER — Encounter: Payer: Self-pay | Admitting: Internal Medicine

## 2021-10-23 ENCOUNTER — Other Ambulatory Visit: Payer: Self-pay

## 2021-10-23 DIAGNOSIS — Z0000 Encounter for general adult medical examination without abnormal findings: Secondary | ICD-10-CM | POA: Diagnosis not present

## 2021-10-23 LAB — URINALYSIS
Bilirubin Urine: NEGATIVE
Hgb urine dipstick: NEGATIVE
Ketones, ur: NEGATIVE
Leukocytes,Ua: NEGATIVE
Nitrite: NEGATIVE
Specific Gravity, Urine: 1.02 (ref 1.000–1.030)
Urine Glucose: NEGATIVE
Urobilinogen, UA: 0.2 (ref 0.0–1.0)
pH: 5.5 (ref 5.0–8.0)

## 2021-10-23 LAB — LIPID PANEL
Cholesterol: 181 mg/dL (ref 0–200)
HDL: 82.2 mg/dL (ref >39.00)
LDL Cholesterol: 83 mg/dL (ref 0–99)
NonHDL: 98.3
Total CHOL/HDL Ratio: 2
Triglycerides: 76 mg/dL (ref 0.0–149.0)
VLDL: 15.2 mg/dL (ref 0.0–40.0)

## 2021-10-23 LAB — CBC WITH DIFFERENTIAL/PLATELET
Basophils Absolute: 0.1 10*3/uL (ref 0.0–0.1)
Basophils Relative: 1 % (ref 0.0–3.0)
Eosinophils Absolute: 0.2 10*3/uL (ref 0.0–0.7)
Eosinophils Relative: 1.8 % (ref 0.0–5.0)
HCT: 42.5 % (ref 39.0–52.0)
Hemoglobin: 13.9 g/dL (ref 13.0–17.0)
Lymphocytes Relative: 16.4 % (ref 12.0–46.0)
Lymphs Abs: 1.9 10*3/uL (ref 0.7–4.0)
MCHC: 32.7 g/dL (ref 30.0–36.0)
MCV: 91.4 fl (ref 78.0–100.0)
Monocytes Absolute: 1.2 10*3/uL — ABNORMAL HIGH (ref 0.1–1.0)
Monocytes Relative: 10.2 % (ref 3.0–12.0)
Neutro Abs: 8.1 10*3/uL — ABNORMAL HIGH (ref 1.4–7.7)
Neutrophils Relative %: 70.6 % (ref 43.0–77.0)
Platelets: 201 10*3/uL (ref 150.0–400.0)
RBC: 4.65 Mil/uL (ref 4.22–5.81)
RDW: 13 % (ref 11.5–15.5)
WBC: 11.5 10*3/uL — ABNORMAL HIGH (ref 4.0–10.5)

## 2021-10-23 LAB — COMPREHENSIVE METABOLIC PANEL
ALT: 15 U/L (ref 0–53)
AST: 22 U/L (ref 0–37)
Albumin: 4.3 g/dL (ref 3.5–5.2)
Alkaline Phosphatase: 76 U/L (ref 39–117)
BUN: 51 mg/dL — ABNORMAL HIGH (ref 6–23)
CO2: 26 mEq/L (ref 19–32)
Calcium: 9.5 mg/dL (ref 8.4–10.5)
Chloride: 105 mEq/L (ref 96–112)
Creatinine, Ser: 2.29 mg/dL — ABNORMAL HIGH (ref 0.40–1.50)
GFR: 26.33 mL/min — ABNORMAL LOW (ref >60.00)
Glucose, Bld: 100 mg/dL — ABNORMAL HIGH (ref 70–99)
Potassium: 4.5 mEq/L (ref 3.5–5.1)
Sodium: 139 mEq/L (ref 135–145)
Total Bilirubin: 0.5 mg/dL (ref 0.2–1.2)
Total Protein: 7.4 g/dL (ref 6.0–8.3)

## 2021-10-23 LAB — PSA: PSA: 11.03 ng/mL — ABNORMAL HIGH (ref 0.10–4.00)

## 2021-10-23 LAB — TSH: TSH: 0.59 u[IU]/mL (ref 0.35–5.50)

## 2021-10-23 NOTE — Assessment & Plan Note (Signed)
  We discussed age appropriate health related issues, including available/recomended screening tests and vaccinations. Labs were ordered to be later reviewed . All questions were answered. We discussed one or more of the following - seat belt use, use of sunscreen/sun exposure exercise, fall risk reduction, second hand smoke exposure, firearm use and storage, seat belt use, a need for adhering to healthy diet and exercise. Labs were ordered.  All questions were answered. Today patient counseled on age appropriate routine health concerns for screening and prevention, each reviewed and up to date or declined. Immunizations reviewed and up to date or declined. Labs ordered and reviewed. Risk factors for depression reviewed and negative. Hearing function and visual acuity are intact. ADLs screened and addressed as needed. Functional ability and level of safety reviewed and appropriate. Education, counseling and referrals performed based on assessed risks today. Patient provided with a copy of personalized plan for preventive services.  Colon - only if issues CT ca score test 1703 -- 2020

## 2021-10-23 NOTE — Progress Notes (Signed)
Subjective:  Patient ID: Sergio Hughes, male    DOB: 1941/05/14  Age: 80 y.o. MRN: [id number]  CC: Annual Exam   HPI [name] presents for annual physical  Immunization History  Administered Date(s) Administered   Fluad Quad(high Dose 65+) 07/25/2019, 08/26/2020, 08/27/2021   Influenza Whole 08/20/2009, 08/21/2010   Influenza, High Dose Seasonal PF 09/28/2016, 09/24/2017, 08/10/2018   Influenza, Seasonal, Injecte, Preservative Fre 11/11/2012   Influenza,inj,Quad PF,6+ Mos 11/20/2014   Influenza-Unspecified 11/07/2013, 11/08/2015   Moderna Covid-19 Vaccine Bivalent Booster 31yrs & up 03/07/2021   PFIZER(Purple Top)SARS-COV-2 Vaccination 12/13/2019, 12/31/2019, 08/22/2020   Pneumococcal Conjugate-13 11/14/2013   Pneumococcal Polysaccharide-23 09/04/2010   Td 08/21/2010   Tdap 02/17/2021   Zoster Recombinat (Shingrix) 09/09/2021   Zoster, Live 11/20/2014     Outpatient Medications Prior to Visit  Medication Sig Dispense Refill   amLODipine (NORVASC) 2.5 MG tablet Take 1 tablet (2.5 mg total) by mouth daily. Keep December appt for future refills 90 tablet 0   aspirin 81 MG EC tablet Take 81 mg by mouth daily.     Cholecalciferol (VITAMIN D) 2000 UNITS CAPS Take 1 capsule by mouth daily.     Glucosamine-Chondroit-Vit C-Mn (GLUCOSAMINE 1500 COMPLEX) CAPS Take by mouth.     Omega-3 Fatty Acids (FISH OIL) 1000 MG CAPS      pravastatin (PRAVACHOL) 20 MG tablet TAKE 1 TABLET EVERY DAY 90 tablet 3   No facility-administered medications prior to visit.    ROS: Review of Systems  Constitutional:  Negative for appetite change, fatigue and unexpected weight change.  HENT:  Negative for congestion, nosebleeds, sneezing, sore throat and trouble swallowing.   Eyes:  Negative for itching and visual disturbance.  Respiratory:  Negative for cough.   Cardiovascular:  Negative for chest pain, palpitations and leg swelling.  Gastrointestinal:  Negative for abdominal distention, blood in  stool, diarrhea and nausea.  Genitourinary:  Negative for frequency and hematuria.  Musculoskeletal:  Negative for back pain, gait problem, joint swelling and neck pain.  Skin:  Negative for rash.  Neurological:  Negative for dizziness, tremors, speech difficulty and weakness.  Psychiatric/Behavioral:  Negative for agitation, dysphoric mood, sleep disturbance and suicidal ideas. The patient is not nervous/anxious.    Objective:  BP 130/70 (BP Location: Left Arm)   Pulse 64   Temp 97.7 F (36.5 C) (Oral)   Ht 5\' 5"  (1.651 m)   Wt 142 lb 6.4 oz (64.6 kg)   SpO2 96%   BMI 23.70 kg/m   BP Readings from Last 3 Encounters:  10/23/21 130/70  10/21/20 130/76  10/21/20 130/76    Wt Readings from Last 3 Encounters:  10/23/21 142 lb 6.4 oz (64.6 kg)  10/21/20 144 lb (65.3 kg)  10/21/20 144 lb (65.3 kg)    Physical Exam Constitutional:      General: He is not in acute distress.    Appearance: He is well-developed.     Comments: NAD  Eyes:     Conjunctiva/sclera: Conjunctivae normal.     Pupils: Pupils are equal, round, and reactive to light.  Neck:     Thyroid: No thyromegaly.     Vascular: No JVD.  Cardiovascular:     Rate and Rhythm: Normal rate and regular rhythm.     Heart sounds: Normal heart sounds. No murmur heard.   No friction rub. No gallop.  Pulmonary:     Effort: Pulmonary effort is normal. No respiratory distress.     Breath sounds: Normal  breath sounds. No wheezing or rales.  Chest:     Chest wall: No tenderness.  Abdominal:     General: Bowel sounds are normal. There is no distension.     Palpations: Abdomen is soft. There is no mass.     Tenderness: There is no abdominal tenderness. There is no guarding or rebound.  Musculoskeletal:        General: No tenderness. Normal range of motion.     Cervical back: Normal range of motion.  Lymphadenopathy:     Cervical: No cervical adenopathy.  Skin:    General: Skin is warm and dry.     Findings: No rash.   Neurological:     Mental Status: He is alert and oriented to person, place, and time.     Cranial Nerves: No cranial nerve deficit.     Motor: No abnormal muscle tone.     Coordination: Coordination normal.     Gait: Gait normal.     Deep Tendon Reflexes: Reflexes are normal and symmetric.  Psychiatric:        Behavior: Behavior normal.        Thought Content: Thought content normal.        Judgment: Judgment normal.    Lab Results  Component Value Date   WBC 9.5 02/09/2019   HGB 15.4 02/09/2019   HCT 45.2 02/09/2019   PLT 208.0 02/09/2019   GLUCOSE 92 10/21/2020   CHOL 160 04/18/2020   TRIG 51.0 04/18/2020   HDL 65.10 04/18/2020   LDLDIRECT 144.9 11/14/2013   LDLCALC 85 04/18/2020   ALT 16 04/18/2020   AST 23 04/18/2020   NA 138 10/21/2020   K 4.2 10/21/2020   CL 104 10/21/2020   CREATININE 1.90 (H) 10/21/2020   BUN 40 (H) 10/21/2020   CO2 28 10/21/2020   TSH 0.50 04/18/2020   PSA 6.35 (H) 09/29/2017   INR 1.02 12/09/2015   HGBA1C 5.8 (H) 12/09/2015    No results found.  Assessment & Plan:   Health Maintenance  Topic Date Due   COLONOSCOPY (Pts 45-16yrs Insurance coverage will need to be confirmed)  04/14/2018   Zoster Vaccines- Shingrix (2 of 2) 11/04/2021   TETANUS/TDAP  02/18/2031   Pneumonia Vaccine 40+ Years old  Completed   INFLUENZA VACCINE  Completed   COVID-19 Vaccine  Completed   HPV VACCINES  Aged Out     Problem List Items Addressed This Visit     Well adult exam     We discussed age appropriate health related issues, including available/recomended screening tests and vaccinations. Labs were ordered to be later reviewed . All questions were answered. We discussed one or more of the following - seat belt use, use of sunscreen/sun exposure exercise, fall risk reduction, second hand smoke exposure, firearm use and storage, seat belt use, a need for adhering to healthy diet and exercise. Labs were ordered.  All questions were answered. Today  patient counseled on age appropriate routine health concerns for screening and prevention, each reviewed and up to date or declined. Immunizations reviewed and up to date or declined. Labs ordered and reviewed. Risk factors for depression reviewed and negative. Hearing function and visual acuity are intact. ADLs screened and addressed as needed. Functional ability and level of safety reviewed and appropriate. Education, counseling and referrals performed based on assessed risks today. Patient provided with a copy of personalized plan for preventive services.  Colon - only if issues CT ca score test 1703 -- 2020  Relevant Orders   TSH   Urinalysis   CBC with Differential/Platelet   Lipid panel   Comprehensive metabolic panel   PSA      No orders of the defined types were placed in this encounter.     Follow-up: Return in about 6 months (around 04/23/2022) for a follow-up visit.  Walker Kehr, MD

## 2021-10-26 ENCOUNTER — Encounter: Payer: Self-pay | Admitting: Internal Medicine

## 2021-10-27 ENCOUNTER — Encounter: Payer: Self-pay | Admitting: Internal Medicine

## 2021-11-12 DIAGNOSIS — N1832 Chronic kidney disease, stage 3b: Secondary | ICD-10-CM | POA: Diagnosis not present

## 2021-12-01 ENCOUNTER — Other Ambulatory Visit: Payer: Self-pay

## 2021-12-01 ENCOUNTER — Ambulatory Visit: Payer: Medicare HMO

## 2021-12-12 ENCOUNTER — Other Ambulatory Visit: Payer: Self-pay

## 2021-12-12 ENCOUNTER — Ambulatory Visit (INDEPENDENT_AMBULATORY_CARE_PROVIDER_SITE_OTHER): Payer: Medicare HMO

## 2021-12-12 DIAGNOSIS — Z0000 Encounter for general adult medical examination without abnormal findings: Secondary | ICD-10-CM

## 2021-12-12 NOTE — Progress Notes (Addendum)
Subjective:   Sergio Hughes is a 81 y.o. male who presents for an Initial Medicare Annual Wellness Visit.  I connected with Willadean Carol today by telephone and verified that I am speaking with the correct person using two identifiers. Location patient: home Location provider: work Persons participating in the virtual visit: patient, provider.   I discussed the limitations, risks, security and privacy concerns of performing an evaluation and management service by telephone and the availability of in person appointments. I also discussed with the patient that there may be a patient responsible charge related to this service. The patient expressed understanding and verbally consented to this telephonic visit.    Interactive audio and video telecommunications were attempted between this provider and patient, however failed, due to patient having technical difficulties OR patient did not have access to video capability.  We continued and completed visit with audio only.    Review of Systems     Cardiac Risk Factors include: advanced age (>34men, >35 women);male gender;dyslipidemia     Objective:    Today's Vitals   There is no height or weight on file to calculate BMI.  Advanced Directives 12/12/2021 10/21/2020 09/17/2020 07/12/2018 12/17/2015 12/09/2015 04/15/2015  Does Patient Have a Medical Advance Directive? Yes Yes Yes Yes No Yes Yes  Type of Paramedic of [place];Living will - Living will;Healthcare Power of [place];Living will - [place];Living will [place]  Does patient want to make changes to medical advance directive? - No - Patient declined No - Patient declined - - No - Patient declined -  Copy of [place] in Chart? No - copy requested - - - - No - copy requested -    Current Medications (verified) Outpatient Encounter Medications as of 12/12/2021  Medication Sig    amLODipine (NORVASC) 2.5 MG tablet Take 1 tablet (2.5 mg total) by mouth daily. Keep December appt for future refills   aspirin 81 MG EC tablet Take 81 mg by mouth daily.   Cholecalciferol (VITAMIN D) 2000 UNITS CAPS Take 1 capsule by mouth daily.   Glucosamine-Chondroit-Vit C-Mn (GLUCOSAMINE 1500 COMPLEX) CAPS Take by mouth.   Omega-3 Fatty Acids (FISH OIL) 1000 MG CAPS    pravastatin (PRAVACHOL) 20 MG tablet TAKE 1 TABLET EVERY DAY   No facility-administered encounter medications on file as of 12/12/2021.    Allergies (verified) Bupropion hcl and Lovastatin   History: Past Medical History:  Diagnosis Date   Coronary artery calcification seen on CT scan    Hyperlipidemia    Hypertension    Osteoarthritis    Past Surgical History:  Procedure Laterality Date   COLON SURGERY     COLONOSCOPY     INGUINAL HERNIA REPAIR  2005   Right   LESION REMOVAL Left 09/23/2020   Procedure: MINOR EXICISION OF LESION OF LEFT EAR;  Surgeon: Izora Gala, MD;  Location: [facility];  Service: ENT;  Laterality: Left;   PARTIAL COLECTOMY Right 12/16/2015   Procedure: PARTIAL CECECTOMY AND APPENDECTOMY;  Surgeon: Judeth Horn, MD;  Location: [facility];  Service: General;  Laterality: Right;   POLYPECTOMY     VASECTOMY     Family History  Problem Relation Age of Onset   Hypertension Father    Cancer Father        brain ca   Prostate cancer Brother 41   Colon cancer Neg Hx    Rectal cancer Neg Hx  Stomach cancer Neg Hx    Esophageal cancer Neg Hx    Social History   Socioeconomic History   Marital status: Married    Spouse name: Jesaiah Fabiano   Number of children: Not on file   Years of education: college    Highest education level: Not on file  Occupational History   Occupation: retired  Tobacco Use   Smoking status: Former    Types: Cigarettes    Quit date: 12/14/1965    Years since quitting: 56.0   Smokeless tobacco: Never  Vaping Use   Vaping Use: Never used  Substance  and Sexual Activity   Alcohol use: Yes    Alcohol/week: 21.0 standard drinks    Types: 7 Glasses of wine, 14 Standard drinks or equivalent per week    Comment: 1-2 glasses of wine or beer each night   Drug use: No   Sexual activity: Not Currently  Other Topics Concern   Not on file  Social History Narrative   Regular exercise - YES - golf   Social Determinants of Health   Financial Resource Strain: Low Risk    Difficulty of Paying Living Expenses: Not hard at all  Food Insecurity: No Food Insecurity   Worried About Charity fundraiser in the Last Year: Never true   Ran Out of Food in the Last Year: Never true  Transportation Needs: No Transportation Needs   Lack of Transportation (Medical): No   Lack of Transportation (Non-Medical): No  Physical Activity: Insufficiently Active   Days of Exercise per Week: 3 days   Minutes of Exercise per Session: 30 min  Stress: No Stress Concern Present   Feeling of Stress : Not at all  Social Connections: Moderately Integrated   Frequency of Communication with Friends and Family: Three times a week   Frequency of Social Gatherings with Friends and Family: Three times a week   Attends Religious Services: Never   Active Member of Clubs or Organizations: Yes   Attends Music therapist: More than 4 times per year   Marital Status: Married    Tobacco Counseling Counseling given: Not Answered   Clinical Intake:  Pre-visit preparation completed: Yes  Pain : No/denies pain     Nutritional Risks: None Diabetes: No  How often do you need to have someone help you when you read instructions, pamphlets, or other written materials from your doctor or pharmacy?: 1 - Never What is the last grade level you completed in school?: BS  Diabetic?no   Interpreter Needed?: No  Information entered by :: [place]   Activities of Daily Living In your present state of health, do you have any difficulty performing the following  activities: 12/12/2021  Hearing? N  Vision? N  Difficulty concentrating or making decisions? N  Walking or climbing stairs? N  Dressing or bathing? N  Doing errands, shopping? N  Preparing Food and eating ? N  Using the Toilet? N  In the past six months, have you accidently leaked urine? N  Do you have problems with loss of bowel control? N  Managing your Medications? N  Managing your Finances? N  Housekeeping or managing your Housekeeping? N  Some recent data might be hidden    Patient Care Team: Plotnikov, [name], MD as PCP - General Domenic Polite Aloha Gell, MD as PCP - Cardiology (Cardiology) Milus Banister, MD as Attending Physician (Gastroenterology) Satira Sark, MD as Consulting Physician (Cardiology) Rexene Agent, MD as Attending Physician (  Nephrology)  Indicate any recent Medical Services you may have received from other than [facility] providers in the past year (date may be approximate).     Assessment:   This is a routine wellness examination for Damyn.  Hearing/Vision screen Vision Screening - Comments:: Annual eye exams   Dietary issues and exercise activities discussed: Current Exercise Habits: Home exercise routine, Type of exercise: walking, Time (Minutes): 30, Frequency (Times/Week): 3, Weekly Exercise (Minutes/Week): 90, Intensity: Mild, Exercise limited by: None identified   Goals Addressed               This Visit's Progress     Patient Stated (pt-stated)   On track     To me more physically active with some weights and also possibly lose around 5-10 pounds.       Depression Screen PHQ 2/9 Scores 12/12/2021 12/12/2021 10/23/2021 10/21/2020 08/15/2019 02/07/2018 12/08/2016  PHQ - 2 Score 0 0 0 0 0 0 0  PHQ- 9 Score - - 0 - - - -    Fall Risk Fall Risk  12/12/2021 10/23/2021 10/21/2020 10/21/2020 08/15/2019  Falls in the past year? 0 0 0 0 0  Number falls in past yr: 0 0 0 0 -  Injury with Fall? 0 0 0 0 -  Risk for fall due to : - No Fall Risks  No Fall Risks - -  Follow up Falls evaluation completed - Falls evaluation completed - Falls evaluation completed    FALL RISK PREVENTION PERTAINING TO THE HOME:  Any stairs in or around the home? Yes  If so, are there any without handrails? No  Home free of loose throw rugs in walkways, pet beds, electrical cords, etc? Yes  Adequate lighting in your home to reduce risk of falls? Yes   ASSISTIVE DEVICES UTILIZED TO PREVENT FALLS:  Life alert? No  Use of a cane, walker or w/c? No  Grab bars in the bathroom? Yes  Shower chair or bench in shower? Yes  Elevated toilet seat or a handicapped toilet? Yes    Cognitive Function:  Normal cognitive status assessed by direct observation by this Nurse Health Advisor. No abnormalities found.        Immunizations Immunization History  Administered Date(s) Administered   Fluad Quad(high Dose 65+) 07/25/2019, 08/26/2020, 08/27/2021   Influenza Whole 08/20/2009, 08/21/2010   Influenza, High Dose Seasonal PF 09/28/2016, 09/24/2017, 08/10/2018   Influenza, Seasonal, Injecte, Preservative Fre 11/11/2012   Influenza,inj,Quad PF,6+ Mos 11/20/2014   Influenza-Unspecified 11/07/2013, 11/08/2015   Moderna Covid-19 Vaccine Bivalent Booster 29yrs & up 03/07/2021   PFIZER(Purple Top)SARS-COV-2 Vaccination 12/13/2019, 12/31/2019, 08/22/2020   Pneumococcal Conjugate-13 11/14/2013   Pneumococcal Polysaccharide-23 09/04/2010   Td 08/21/2010   Tdap 02/17/2021   Zoster Recombinat (Shingrix) 09/09/2021   Zoster, Live 11/20/2014    TDAP status: Up to date  Flu Vaccine status: Up to date  Pneumococcal vaccine status: Up to date  Covid-19 vaccine status: Completed vaccines  Qualifies for Shingles Vaccine? Yes   Zostavax completed Yes   Shingrix Completed?: Yes  Screening Tests Health Maintenance  Topic Date Due   COLONOSCOPY (Pts 45-50yrs Insurance coverage will need to be confirmed)  04/14/2018   Zoster Vaccines- Shingrix (2 of 2) 11/04/2021    TETANUS/TDAP  02/18/2031   Pneumonia Vaccine 19+ Years old  Completed   INFLUENZA VACCINE  Completed   COVID-19 Vaccine  Completed   HPV VACCINES  Aged Out    Health Maintenance  Health Maintenance Due  Topic Date  Due   COLONOSCOPY (Pts 45-64yrs Insurance coverage will need to be confirmed)  04/14/2018   Zoster Vaccines- Shingrix (2 of 2) 11/04/2021    Colorectal cancer screening: No longer required.   Lung Cancer Screening: (Low Dose CT Chest recommended if Age 27-80 years, 30 pack-year currently smoking OR have quit w/in 15years.) does not qualify.   Lung Cancer Screening Referral: n/a  Additional Screening:  Hepatitis C Screening: does not qualify;   Vision Screening: Recommended annual ophthalmology exams for early detection of glaucoma and other disorders of the eye. Is the patient up to date with their annual eye exam?  Yes  Who is the provider or what is the name of the office in which the patient attends annual eye exams? Unknown  If pt is not established with a provider, would they like to be referred to a provider to establish care? No .   Dental Screening: Recommended annual dental exams for proper oral hygiene  Community Resource Referral / Chronic Care Management: CRR required this visit?  No   CCM required this visit?  No      Plan:     I have personally reviewed and noted the following in the patients chart:   Medical and social history Use of alcohol, tobacco or illicit drugs  Current medications and supplements including opioid prescriptions. Patient is not currently taking opioid prescriptions. Functional ability and status Nutritional status Physical activity Advanced directives List of other physicians Hospitalizations, surgeries, and ER visits in previous 12 months Vitals Screenings to include cognitive, depression, and falls Referrals and appointments  In addition, I have reviewed and discussed with patient certain preventive  protocols, quality metrics, and best practice recommendations. A written personalized care plan for preventive services as well as general preventive health recommendations were provided to patient.     Randel Pigg, LPN   [id number]   Nurse Notes: none   Medical screening examination/treatment/procedure(s) were performed by non-physician practitioner and as supervising physician I was immediately available for consultation/collaboration.  I agree with above. Lew Dawes, MD

## 2021-12-12 NOTE — Patient Instructions (Signed)
Sergio Hughes , Thank you for taking time to come for your Medicare Wellness Visit. I appreciate your ongoing commitment to your health goals. Please review the following plan we discussed and let me know if I can assist you in the future.   Screening recommendations/referrals: Colonoscopy: no longer required  Recommended yearly ophthalmology/optometry visit for glaucoma screening and checkup Recommended yearly dental visit for hygiene and checkup  Vaccinations: Influenza vaccine: completed  Pneumococcal vaccine: completed  Tdap vaccine: 03/28/20222 Shingles vaccine: completed     Advanced directives: yes   Conditions/risks identified: none   Next appointment: none   Preventive Care 31 Years and Older, Male Preventive care refers to lifestyle choices and visits with your health care provider that can promote health and wellness. What does preventive care include? A yearly physical exam. This is also called an annual well check. Dental exams once or twice a year. Routine eye exams. Ask your health care provider how often you should have your eyes checked. Personal lifestyle choices, including: Daily care of your teeth and gums. Regular physical activity. Eating a healthy diet. Avoiding tobacco and drug use. Limiting alcohol use. Practicing safe sex. Taking low doses of aspirin every day. Taking vitamin and mineral supplements as recommended by your health care provider. What happens during an annual well check? The services and screenings done by your health care provider during your annual well check will depend on your age, overall health, lifestyle risk factors, and family history of disease. Counseling  Your health care provider may ask you questions about your: Alcohol use. Tobacco use. Drug use. Emotional well-being. Home and relationship well-being. Sexual activity. Eating habits. History of falls. Memory and ability to understand (cognition). Work and work  Statistician. Screening  You may have the following tests or measurements: Height, weight, and BMI. Blood pressure. Lipid and cholesterol levels. These may be checked every 5 years, or more frequently if you are over 32 years old. Skin check. Lung cancer screening. You may have this screening every year starting at age 41 if you have a 30-pack-year history of smoking and currently smoke or have quit within the past 15 years. Fecal occult blood test (FOBT) of the stool. You may have this test every year starting at age 91. Flexible sigmoidoscopy or colonoscopy. You may have a sigmoidoscopy every 5 years or a colonoscopy every 10 years starting at age 37. Prostate cancer screening. Recommendations will vary depending on your family history and other risks. Hepatitis C blood test. Hepatitis B blood test. Sexually transmitted disease (STD) testing. Diabetes screening. This is done by checking your blood sugar (glucose) after you have not eaten for a while (fasting). You may have this done every 1-3 years. Abdominal aortic aneurysm (AAA) screening. You may need this if you are a current or former smoker. Osteoporosis. You may be screened starting at age 92 if you are at high risk. Talk with your health care provider about your test results, treatment options, and if necessary, the need for more tests. Vaccines  Your health care provider may recommend certain vaccines, such as: Influenza vaccine. This is recommended every year. Tetanus, diphtheria, and acellular pertussis (Tdap, Td) vaccine. You may need a Td booster every 10 years. Zoster vaccine. You may need this after age 67. Pneumococcal 13-valent conjugate (PCV13) vaccine. One dose is recommended after age 50. Pneumococcal polysaccharide (PPSV23) vaccine. One dose is recommended after age 75. Talk to your health care provider about which screenings and vaccines you need and how  often you need them. This information is not intended to replace  advice given to you by your health care provider. Make sure you discuss any questions you have with your health care provider. Document Released: 12/06/2015 Document Revised: 07/29/2016 Document Reviewed: 09/10/2015 Elsevier Interactive Patient Education  2017 [place] Prevention in the Home Falls can cause injuries. They can happen to people of all ages. There are many things you can do to make your home safe and to help prevent falls. What can I do on the outside of my home? Regularly fix the edges of walkways and driveways and fix any cracks. Remove anything that might make you trip as you walk through a door, such as a raised step or threshold. Trim any bushes or trees on the path to your home. Use bright outdoor lighting. Clear any walking paths of anything that might make someone trip, such as rocks or tools. Regularly check to see if handrails are loose or broken. Make sure that both sides of any steps have handrails. Any raised decks and porches should have guardrails on the edges. Have any leaves, snow, or ice cleared regularly. Use sand or salt on walking paths during winter. Clean up any spills in your garage right away. This includes oil or grease spills. What can I do in the bathroom? Use night lights. Install grab bars by the toilet and in the tub and shower. Do not use towel bars as grab bars. Use non-skid mats or decals in the tub or shower. If you need to sit down in the shower, use a plastic, non-slip stool. Keep the floor dry. Clean up any water that spills on the floor as soon as it happens. Remove soap buildup in the tub or shower regularly. Attach bath mats securely with double-sided non-slip rug tape. Do not have throw rugs and other things on the floor that can make you trip. What can I do in the bedroom? Use night lights. Make sure that you have a light by your bed that is easy to reach. Do not use any sheets or blankets that are too big for your bed.  They should not hang down onto the floor. Have a firm chair that has side arms. You can use this for support while you get dressed. Do not have throw rugs and other things on the floor that can make you trip. What can I do in the kitchen? Clean up any spills right away. Avoid walking on wet floors. Keep items that you use a lot in easy-to-reach places. If you need to reach something above you, use a strong step stool that has a grab bar. Keep electrical cords out of the way. Do not use floor polish or wax that makes floors slippery. If you must use wax, use non-skid floor wax. Do not have throw rugs and other things on the floor that can make you trip. What can I do with my stairs? Do not leave any items on the stairs. Make sure that there are handrails on both sides of the stairs and use them. Fix handrails that are broken or loose. Make sure that handrails are as long as the stairways. Check any carpeting to make sure that it is firmly attached to the stairs. Fix any carpet that is loose or worn. Avoid having throw rugs at the top or bottom of the stairs. If you do have throw rugs, attach them to the floor with carpet tape. Make sure that you have a  light switch at the top of the stairs and the bottom of the stairs. If you do not have them, ask someone to add them for you. What else can I do to help prevent falls? Wear shoes that: Do not have high heels. Have rubber bottoms. Are comfortable and fit you well. Are closed at the toe. Do not wear sandals. If you use a stepladder: Make sure that it is fully opened. Do not climb a closed stepladder. Make sure that both sides of the stepladder are locked into place. Ask someone to hold it for you, if possible. Clearly mark and make sure that you can see: Any grab bars or handrails. First and last steps. Where the edge of each step is. Use tools that help you move around (mobility aids) if they are needed. These  include: Canes. Walkers. Scooters. Crutches. Turn on the lights when you go into a dark area. Replace any light bulbs as soon as they burn out. Set up your furniture so you have a clear path. Avoid moving your furniture around. If any of your floors are uneven, fix them. If there are any pets around you, be aware of where they are. Review your medicines with your doctor. Some medicines can make you feel dizzy. This can increase your chance of falling. Ask your doctor what other things that you can do to help prevent falls. This information is not intended to replace advice given to you by your health care provider. Make sure you discuss any questions you have with your health care provider. Document Released: 09/05/2009 Document Revised: 04/16/2016 Document Reviewed: 12/14/2014 Elsevier Interactive Patient Education  2017 [employer].

## 2021-12-20 ENCOUNTER — Other Ambulatory Visit: Payer: Self-pay | Admitting: Internal Medicine

## 2022-02-06 ENCOUNTER — Other Ambulatory Visit: Payer: Self-pay | Admitting: Internal Medicine

## 2022-02-10 DIAGNOSIS — N1832 Chronic kidney disease, stage 3b: Secondary | ICD-10-CM | POA: Diagnosis not present

## 2022-02-16 DIAGNOSIS — I129 Hypertensive chronic kidney disease with stage 1 through stage 4 chronic kidney disease, or unspecified chronic kidney disease: Secondary | ICD-10-CM | POA: Diagnosis not present

## 2022-02-16 DIAGNOSIS — R809 Proteinuria, unspecified: Secondary | ICD-10-CM | POA: Diagnosis not present

## 2022-02-16 DIAGNOSIS — N184 Chronic kidney disease, stage 4 (severe): Secondary | ICD-10-CM | POA: Diagnosis not present

## 2022-03-09 ENCOUNTER — Ambulatory Visit: Payer: Medicare HMO | Admitting: Urology

## 2022-03-09 ENCOUNTER — Encounter: Payer: Self-pay | Admitting: Urology

## 2022-03-09 VITALS — BP 107/60 | HR 74

## 2022-03-09 DIAGNOSIS — R972 Elevated prostate specific antigen [PSA]: Secondary | ICD-10-CM | POA: Diagnosis not present

## 2022-03-09 MED ORDER — ALFUZOSIN HCL ER 10 MG PO TB24: 10.0000 mg | ORAL_TABLET | Freq: Every evening | ORAL | 11 refills | Status: DC

## 2022-03-09 NOTE — Patient Instructions (Signed)

## 2022-03-09 NOTE — Progress Notes (Signed)
? ?03/09/2022 ?9:13 AM  ? ?[name] ?1940/12/17 ?[id number] ? ?Referring provider: Plotnikov, [name], MD ?[place],   [id number] ? ?Elevated PSA ? ? ?HPI: ?Sergio Hughes is a 81yo here for evaluation of elevtaed PSA. His PSAs have ranged from 5-7 for the past 6 years. PSA increased to 11 this year. IPSS 14 QOl 3. Nocturia 3-4x. Frequency every 2 hours. Urine stream strong., No straining to urinate. ? ?His records from [facility] are as follows: ?My PSA is elevated above the normal range. HPI: Sergio Hughes is a 81 year-old male established patient who is here for an elevated PSA.??His PSA is 5.9. He has had PSA's drawn prior to this one. He has not had elevated PSA's prior to this one. He indicates there were no urinary problems when the PSA was drawn. He has not had a prostate nodule on a physical examination. He has not had recurrent prostate infections or chronic prostatitis. ??He has not been on antibiotics for prostate infections previously. He has not had a prostate biopsy done. ??02/03/2017: His PSA has been around 3-4 and recently increased to 5.9. No significant LUTS ??08/04/2017: PSA decreased to 3.6. ??01/26/2018: PSA increased to 7.5 from 3.6. The patient has had a cold for the past 10 days and has been taking decongestants. ??03/30/2018: PSA decreased to 6.9 from 7.5. He is on decongestants for allergies ??07/27/2018: His PSA decreased to 1.6 after the patient stopped his decongestants ??CC: I get up too often at night to urinate. HPI: He first noticed the symptom approximately 01/22/2012. He usually gets up at night to urinate 2 times. He does not have nights when he does not get up to urinate at all. He does not have trouble falling back asleep once he has been woken up at night. ??He does not usually have swelling in his hands and feet during the day. He does not take a diuretic. He does not have to strain or bear down to start his urinary stream. ??02/03/2017: he has nocturia 2-3x. He drinks 3 cups  of tea after dinner. ??08/04/2017: He still has nocturia 2-3x. He has not stopped his caffeine intake prior to bedtime. ??01/26/2018: Nocturia is stable at 2-3x. ??03/30/2018: Nocturia is stable at 2x but he drinks over 20oz of fluid within 2 hours of bedtime. ??07/27/2018: He has stable nocturia 2-3x. He drinks over 20oz prior to bedtime. No LUTS during the day ??AUA Symptom Score: Less than 20% of the time he has the sensation of not emptying his bladder completely when finished urinating. Less than 20% of the time he has to urinate again fewer than two hours after he has finished urinating. Less than 20% of the time he has to start and stop again several times when he urinates. He never finds it difficult to postpone urination. He never has a weak urinary stream. He never has to push or strain to begin urination. He has to get up to urinate 2 times from the time he goes to bed until the time he gets up in the morning. ??Calculated AUA Symptom Score: 5??QOL Score: He would feel pleased if he had to live with his urinary condition the way it is now for the rest of his life. ??Calculated QOL Symptom Score: 1? ? ? ? ?PMH: ?Past Medical History:  ?Diagnosis Date  ? Coronary artery calcification seen on CT scan   ? Hyperlipidemia   ? Hypertension   ? Osteoarthritis   ? ? ?Surgical History: ?Past Surgical  History:  ?Procedure Laterality Date  ? COLON SURGERY    ? COLONOSCOPY    ? INGUINAL HERNIA REPAIR  2005  ? Right  ? LESION REMOVAL Left 09/23/2020  ? Procedure: MINOR EXICISION OF LESION OF LEFT EAR;  Surgeon: Izora Gala, MD;  Location: [facility];  Service: ENT;  Laterality: Left;  ? PARTIAL COLECTOMY Right 12/16/2015  ? Procedure: PARTIAL CECECTOMY AND APPENDECTOMY;  Surgeon: Judeth Horn, MD;  Location: [facility];  Service: General;  Laterality: Right;  ? POLYPECTOMY    ? VASECTOMY    ? ? ?Home Medications:  ?Allergies as of 03/09/2022   ? ?   Reactions  ? Bupropion Hcl   ? REACTION: extreme dizziness  ?  Lovastatin   ? myalgias  ? ?  ? ?  ?Medication List  ?  ? ?  ? Accurate as of March 09, 2022  9:13 AM. If you have any questions, ask your nurse or doctor.  ?  ?  ? ?  ? ?amLODipine 2.5 MG tablet ?Commonly known as: NORVASC ?TAKE 1 TABLET EVERY DAY (KEEP DECEMBER APPOINTMENT FOR FUTURE REFILLS) ?  ?aspirin 81 MG EC tablet ?Take 81 mg by mouth daily. ?  ?Fish Oil 1000 MG Caps ?  ?Glucosamine 1500 Complex Caps ?Take by mouth. ?  ?pravastatin 20 MG tablet ?Commonly known as: PRAVACHOL ?TAKE 1 TABLET EVERY DAY ?  ?Vitamin D 50 MCG (2000 UT) Caps ?Take 1 capsule by mouth daily. ?  ? ?  ? ? ?Allergies:  ?Allergies  ?Allergen Reactions  ? Bupropion Hcl   ?  REACTION: extreme dizziness  ? Lovastatin   ?  myalgias  ? ? ?Family History: ?Family History  ?Problem Relation Age of Onset  ? Hypertension Father   ? Cancer Father   ?     brain ca  ? Prostate cancer Brother 75  ? Colon cancer Neg Hx   ? Rectal cancer Neg Hx   ? Stomach cancer Neg Hx   ? Esophageal cancer Neg Hx   ? ? ?Social History:  reports that he quit smoking about 56 years ago. His smoking use included cigarettes. He has never used smokeless tobacco. He reports current alcohol use of about 21.0 standard drinks per week. He reports that he does not use drugs. ? ?ROS: ?All other review of systems were reviewed and are negative except what is noted above in HPI ? ?Physical Exam: ?BP 107/60   Pulse 74   ?Constitutional:  Alert and oriented, No acute distress. ?HEENT: [place] AT, moist mucus membranes.  Trachea midline, no masses. ?Cardiovascular: No clubbing, cyanosis, or edema. ?Respiratory: Normal respiratory effort, no increased work of breathing. ?GI: Abdomen is soft, nontender, nondistended, no abdominal masses ?GU: No CVA tenderness. Circumcised phallus. No masses/lesions on penis, testis, scrotum. Prostate 80g smooth no nodules no induration.  ?Lymph: No cervical or inguinal lymphadenopathy. ?Skin: No rashes, bruises or suspicious lesions. ?Neurologic: Grossly  intact, no focal deficits, moving all 4 extremities. ?Psychiatric: Normal mood and affect. ? ?Laboratory Data: ?Lab Results  ?Component Value Date  ? WBC 11.5 (H) 10/23/2021  ? HGB 13.9 10/23/2021  ? HCT 42.5 10/23/2021  ? MCV 91.4 10/23/2021  ? PLT 201.0 Repeated and verified X2. 10/23/2021  ? ? ?Lab Results  ?Component Value Date  ? CREATININE 2.29 (H) 10/23/2021  ? ? ?Lab Results  ?Component Value Date  ? PSA 11.03 (H) 10/23/2021  ? PSA 6.35 (H) 09/29/2017  ? PSA 6.51 (H) 05/24/2017  ? ? ?  No results found for: TESTOSTERONE ? ?Lab Results  ?Component Value Date  ? HGBA1C 5.8 (H) 12/09/2015  ? ? ?Urinalysis ?   ?Component Value Date/Time  ? [place] 10/23/2021 1016  ? APPEARANCEUR CLEAR 10/23/2021 1016  ? LABSPEC 1.020 10/23/2021 1016  ? PHURINE 5.5 10/23/2021 1016  ? GLUCOSEU NEGATIVE 10/23/2021 1016  ? HGBUR NEGATIVE 10/23/2021 1016  ? [place] NEGATIVE 10/23/2021 1016  ? [place] NEGATIVE 10/23/2021 1016  ? UROBILINOGEN 0.2 10/23/2021 1016  ? NITRITE NEGATIVE 10/23/2021 1016  ? LEUKOCYTESUR NEGATIVE 10/23/2021 1016  ? ? ?No results found for: LABMICR, [place], RBCUA, LABEPIT, MUCUS, BACTERIA ? ?Pertinent Imaging: ? ?No results found for this or any previous visit. ? ?No results found for this or any previous visit. ? ?No results found for this or any previous visit. ? ?No results found for this or any previous visit. ? ?Results for orders placed during the hospital encounter of 05/10/20 ? ?US Renal ? ?Narrative ?CLINICAL DATA:  Initial evaluation for chronic renal insufficiency, ?proteinuria. ? ?EXAM: ?RENAL / URINARY TRACT ULTRASOUND COMPLETE ? ?COMPARISON:  None available. ? ?FINDINGS: ?Right Kidney: ? ?Renal measurements: 11.3 x 3.6 x 3.3 cm = volume: 75.6 mL. Diffuse ?cortical thinning with markedly increased echogenicity within the ?renal parenchyma and poor corticomedullary differentiation. No ?nephrolithiasis or hydronephrosis. No focal renal mass. ? ?Left Kidney: ? ?Renal measurements: 11.3 x  4.6 x 4.5 cm = volume: 122.3 mL. Diffuse ?cortical thinning with markedly increased echogenicity within the ?renal parenchyma and poor corticomedullary differentiation. No ?nephrolithiasis or hydronephrosis. No

## 2022-03-10 LAB — MICROSCOPIC EXAMINATION
Bacteria, UA: NONE SEEN
Epithelial Cells (non renal): NONE SEEN /hpf (ref 0–10)
RBC, Urine: NONE SEEN /hpf (ref 0–2)
Renal Epithel, UA: NONE SEEN /hpf

## 2022-03-10 LAB — URINALYSIS, ROUTINE W REFLEX MICROSCOPIC
Bilirubin, UA: NEGATIVE
Glucose, UA: NEGATIVE
Ketones, UA: NEGATIVE
Leukocytes,UA: NEGATIVE
Nitrite, UA: NEGATIVE
RBC, UA: NEGATIVE
Specific Gravity, UA: 1.015 (ref 1.005–1.030)
Urobilinogen, Ur: 0.2 mg/dL (ref 0.2–1.0)
pH, UA: 5.5 (ref 5.0–7.5)

## 2022-03-10 LAB — PSA: Prostate Specific Ag, Serum: 11.9 ng/mL — ABNORMAL HIGH (ref 0.0–4.0)

## 2022-03-31 DIAGNOSIS — N189 Chronic kidney disease, unspecified: Secondary | ICD-10-CM | POA: Diagnosis not present

## 2022-03-31 DIAGNOSIS — N184 Chronic kidney disease, stage 4 (severe): Secondary | ICD-10-CM | POA: Diagnosis not present

## 2022-04-01 ENCOUNTER — Other Ambulatory Visit: Payer: Medicare HMO

## 2022-04-01 DIAGNOSIS — R972 Elevated prostate specific antigen [PSA]: Secondary | ICD-10-CM

## 2022-04-02 LAB — PSA: Prostate Specific Ag, Serum: 11.9 ng/mL — ABNORMAL HIGH (ref 0.0–4.0)

## 2022-04-08 ENCOUNTER — Ambulatory Visit: Payer: Medicare HMO | Admitting: Physician Assistant

## 2022-04-08 VITALS — BP 126/71 | HR 69 | Ht 65.0 in | Wt 142.4 lb

## 2022-04-08 DIAGNOSIS — R972 Elevated prostate specific antigen [PSA]: Secondary | ICD-10-CM

## 2022-04-08 DIAGNOSIS — N401 Enlarged prostate with lower urinary tract symptoms: Secondary | ICD-10-CM | POA: Diagnosis not present

## 2022-04-08 DIAGNOSIS — R351 Nocturia: Secondary | ICD-10-CM

## 2022-04-08 LAB — URINALYSIS, ROUTINE W REFLEX MICROSCOPIC
Bilirubin, UA: NEGATIVE
Glucose, UA: NEGATIVE
Ketones, UA: NEGATIVE
Leukocytes,UA: NEGATIVE
Nitrite, UA: NEGATIVE
RBC, UA: NEGATIVE
Specific Gravity, UA: 1.02 (ref 1.005–1.030)
Urobilinogen, Ur: 0.2 mg/dL (ref 0.2–1.0)
pH, UA: 5.5 (ref 5.0–7.5)

## 2022-04-08 MED ORDER — LEVOFLOXACIN 750 MG PO TABS: 750.0000 mg | ORAL_TABLET | Freq: Once | ORAL | 0 refills | Status: AC

## 2022-04-08 NOTE — Patient Instructions (Addendum)
? ?  Appointment Time:12:15 ?Appointment Date:04/29/2022 ? ?Location: [facility]  ? ?Prostate Biopsy Instructions ? ?Stop all aspirin or blood thinners (aspirin, plavix, coumadin, warfarin, motrin, ibuprofen, advil, aleve, naproxen, naprosyn) for 7 days prior to the procedure.  If you have any questions about stopping these medications, please contact your primary care physician or cardiologist. ? ?Having a light meal prior to the procedure is recommended.  If you are diabetic or have low blood sugar please bring a small snack or glucose tablet. ? ?A Fleets enema is needed to be purchased over the counter at a local pharmacy and used 2 hours before you scheduled appointment.  This can be purchased over the counter at any pharmacy. ? ?Antibiotics will be administered in the clinic at the time of the procedure and 1 tablet has been sent to your pharmacy. Please take the antibiotic as prescribed.   ? ?Please bring someone with you to the procedure to drive you home if you are given a valium to take prior to your procedure. ? ? ?If you have any questions or concerns, please feel free to call the office at [phone] or send a Mychart message. ? ? ? ?Thank you, ?[facility]  ?

## 2022-04-08 NOTE — Progress Notes (Signed)
? ?Assessment: ?1. Elevated PSA ?- Urinalysis, Routine w reflex microscopic ?- [place] Transrectal Complete ?- [place] PROSTATE BIOPSY MULTIPLE ?- US Guided Needle Placement ? ?2. Benign prostatic hyperplasia with nocturia ?  ? ?Plan: ?Pt advised to fill Rx for Uroxatrol that Dr. Alyson Ingles sent last visit. ?I had a long discussion with the patient regarding persistent PSA elevation with it remaining above 11.  I discussed the pros and cons of further evaluation including TRUS and prostate Bx.  Potential adverse events and complications as well as standard instructions were given.  Patient expressed his understanding of these issues. Ordered placed and procedure scheduled. ? ?Chief Complaint: ?No chief complaint on file. ? ? ?HPI: ?Sergio Hughes is a 81 y.o. male who presents for continued evaluation of PSA elevation and BPH/LUTS. Pt did not begin Uroxatrol as Rx. Voids 3x per night and has occasional urgencyu and frequency. No hematuria. PSA last week remains at 11.9. IPSS=9, QOL=2 ? ?Sergio Hughes is a 81yo here for evaluation of elevtaed PSA. His PSAs have ranged from 5-7 for the past 6 years. PSA increased to 11 this year. IPSS 14 QOl 3. Nocturia 3-4x. Frequency every 2 hours. Urine stream strong., No straining to urinate. ?  ?03/09/22 ?His records from [facility] are as follows: ?My PSA is elevated above the normal range. HPI: Sergio Hughes is a 81 year-old male established patient who is here for an elevated PSA.? ??His PSA is 5.9. He has had PSA's drawn prior to this one. He has not had elevated PSA's prior to this one. He indicates there were no urinary problems when the PSA was drawn. He has not had a prostate nodule on a physical examination. He has not had recurrent prostate infections or chronic prostatitis. ??He has not been on antibiotics for prostate infections previously. He has not had a prostate biopsy done. ??02/03/2017: His PSA has been around 3-4 and recently increased to 5.9. No significant LUTS ??08/04/2017: PSA decreased  to 3.6. ??01/26/2018: PSA increased to 7.5 from 3.6. The patient has had a cold for the past 10 days and has been taking decongestants. ??03/30/2018: PSA decreased to 6.9 from 7.5. He is on decongestants for allergies ??07/27/2018: His PSA decreased to 1.6 after the patient stopped his decongestants ??CC: I get up too often at night to urinate. HPI: He first noticed the symptom approximately 01/22/2012. He usually gets up at night to urinate 2 times. He does not have nights when he does not get up to urinate at all. He does not have trouble falling back asleep once he has been woken up at night. ??He does not usually have swelling in his hands and feet during the day. He does not take a diuretic. He does not have to strain or bear down to start his urinary stream. ??02/03/2017: he has nocturia 2-3x. He drinks 3 cups of tea after dinner. ??08/04/2017: He still has nocturia 2-3x. He has not stopped his caffeine intake prior to bedtime. ??01/26/2018: Nocturia is stable at 2-3x. ??03/30/2018: Nocturia is stable at 2x but he drinks over 20oz of fluid within 2 hours of bedtime. ??07/27/2018: He has stable nocturia 2-3x. He drinks over 20oz prior to bedtime. No LUTS during the day ??AUA Symptom Score: Less than 20% of the time he has the sensation of not emptying his bladder completely when finished urinating. Less than 20% of the time he has to urinate again fewer than two hours after he has finished urinating. Less than 20% of the time he  has to start and stop again several times when he urinates. He never finds it difficult to postpone urination. He never has a weak urinary stream. He never has to push or strain to begin urination. He has to get up to urinate 2 times from the time he goes to bed until the time he gets up in the morning. ??Calculated AUA Symptom Score: 5??QOL Score: He would feel pleased if he had to live with his urinary condition the way it is now for the rest of his life. ??Calculated QOL Symptom Score:  1? ? ?Portions of the above documentation were copied from a prior visit for review purposes only. ? ?Allergies: ?Allergies  ?Allergen Reactions  ? Bupropion Hcl   ?  REACTION: extreme dizziness  ? Lovastatin   ?  myalgias  ? ? ?PMH: ?Past Medical History:  ?Diagnosis Date  ? Coronary artery calcification seen on CT scan   ? Hyperlipidemia   ? Hypertension   ? Osteoarthritis   ? ? ?PSH: ?Past Surgical History:  ?Procedure Laterality Date  ? COLON SURGERY    ? COLONOSCOPY    ? INGUINAL HERNIA REPAIR  2005  ? Right  ? LESION REMOVAL Left 09/23/2020  ? Procedure: MINOR EXICISION OF LESION OF LEFT EAR;  Surgeon: Izora Gala, MD;  Location: [facility];  Service: ENT;  Laterality: Left;  ? PARTIAL COLECTOMY Right 12/16/2015  ? Procedure: PARTIAL CECECTOMY AND APPENDECTOMY;  Surgeon: Judeth Horn, MD;  Location: [facility];  Service: General;  Laterality: Right;  ? POLYPECTOMY    ? VASECTOMY    ? ? ?SH: ?Social History  ? ?Tobacco Use  ? Smoking status: Former  ?  Types: Cigarettes  ?  Quit date: 12/14/1965  ?  Years since quitting: 56.3  ? Smokeless tobacco: Never  ?Vaping Use  ? Vaping Use: Never used  ?Substance Use Topics  ? Alcohol use: Yes  ?  Alcohol/week: 21.0 standard drinks  ?  Types: 7 Glasses of wine, 14 Standard drinks or equivalent per week  ?  Comment: 1-2 glasses of wine or beer each night  ? Drug use: No  ? ? ?ROS: ?Constitutional:  Negative for fever, chills, weight loss ?CV: Negative for chest pain ?Respiratory:  Negative for shortness of breath, wheezing, sleep apnea, frequent cough ?GI:  Negative for nausea, vomiting, bloody stool, GERD ? ?PE: ?BP 126/71   Pulse 69   Ht '5\' 5"'$  (1.651 m)   Wt 142 lb 6.4 oz (64.6 kg)   BMI 23.70 kg/m?  ?GENERAL APPEARANCE:  Well appearing, well developed, well nourished, NAD ?HEENT:  Atraumatic, normocephalic ?NECK:  Supple. Trachea midline ?ABDOMEN:  Soft, non-tender, no masses ?NEUROLOGIC:  Alert and oriented x 3, normal gait, CN II-XII grossly intact ?MENTAL  STATUS:  appropriate ?BACK:  Non-tender to palpation, No CVAT ?SKIN:  Warm, dry, and intact ? ? ?Results: ?Laboratory Data: ?Lab Results  ?Component Value Date  ? WBC 11.5 (H) 10/23/2021  ? HGB 13.9 10/23/2021  ? HCT 42.5 10/23/2021  ? MCV 91.4 10/23/2021  ? PLT 201.0 Repeated and verified X2. 10/23/2021  ? ? ?Lab Results  ?Component Value Date  ? CREATININE 2.29 (H) 10/23/2021  ? ? ?Lab Results  ?Component Value Date  ? PSA 11.03 (H) 10/23/2021  ? PSA 6.35 (H) 09/29/2017  ? PSA 6.51 (H) 05/24/2017  ? ? ?Lab Results  ?Component Value Date  ? HGBA1C 5.8 (H) 12/09/2015  ? ? ?Urinalysis ?   ?Component Value Date/Time  ?  [place] 10/23/2021 1016  ? APPEARANCEUR Clear 03/09/2022 0917  ? LABSPEC 1.020 10/23/2021 1016  ? PHURINE 5.5 10/23/2021 1016  ? GLUCOSEU Negative 03/09/2022 0917  ? GLUCOSEU NEGATIVE 10/23/2021 1016  ? HGBUR NEGATIVE 10/23/2021 1016  ? BILIRUBINUR Negative 03/09/2022 0917  ? [place] NEGATIVE 10/23/2021 1016  ? PROTEINUR 1+ (A) 03/09/2022 0917  ? UROBILINOGEN 0.2 10/23/2021 1016  ? NITRITE Negative 03/09/2022 0917  ? NITRITE NEGATIVE 10/23/2021 1016  ? LEUKOCYTESUR Negative 03/09/2022 0917  ? LEUKOCYTESUR NEGATIVE 10/23/2021 1016  ? ? ?Lab Results  ?Component Value Date  ? LABMICR See below: 03/09/2022  ? WBCUA 0-5 03/09/2022  ? LABEPIT None seen 03/09/2022  ? BACTERIA None seen 03/09/2022  ? ? ?Pertinent Imaging: ?No results found for this or any previous visit. ? ?No results found for this or any previous visit. ? ?No results found for this or any previous visit. ? ?No results found for this or any previous visit. ? ?Results for orders placed during the hospital encounter of 05/10/20 ? ?US Renal ? ?Narrative ?CLINICAL DATA:  Initial evaluation for chronic renal insufficiency, ?proteinuria. ? ?EXAM: ?RENAL / URINARY TRACT ULTRASOUND COMPLETE ? ?COMPARISON:  None available. ? ?FINDINGS: ?Right Kidney: ? ?Renal measurements: 11.3 x 3.6 x 3.3 cm = volume: 75.6 mL. Diffuse ?cortical thinning  with markedly increased echogenicity within the ?renal parenchyma and poor corticomedullary differentiation. No ?nephrolithiasis or hydronephrosis. No focal renal mass. ? ?Left Kidney: ? ?Renal measurements: 11.3 x 4.6

## 2022-04-24 ENCOUNTER — Encounter: Payer: Self-pay | Admitting: Internal Medicine

## 2022-04-24 ENCOUNTER — Ambulatory Visit (INDEPENDENT_AMBULATORY_CARE_PROVIDER_SITE_OTHER): Payer: Medicare HMO | Admitting: Internal Medicine

## 2022-04-24 VITALS — BP 120/74 | HR 68 | Ht 65.0 in | Wt 142.0 lb

## 2022-04-24 DIAGNOSIS — K219 Gastro-esophageal reflux disease without esophagitis: Secondary | ICD-10-CM

## 2022-04-24 DIAGNOSIS — H9313 Tinnitus, bilateral: Secondary | ICD-10-CM

## 2022-04-24 DIAGNOSIS — R0989 Other specified symptoms and signs involving the circulatory and respiratory systems: Secondary | ICD-10-CM | POA: Insufficient documentation

## 2022-04-24 DIAGNOSIS — R972 Elevated prostate specific antigen [PSA]: Secondary | ICD-10-CM | POA: Diagnosis not present

## 2022-04-24 DIAGNOSIS — M542 Cervicalgia: Secondary | ICD-10-CM

## 2022-04-24 DIAGNOSIS — H9319 Tinnitus, unspecified ear: Secondary | ICD-10-CM | POA: Insufficient documentation

## 2022-04-24 MED ORDER — LORATADINE 10 MG PO TABS: 10.0000 mg | ORAL_TABLET | Freq: Every day | ORAL | 3 refills | Status: DC

## 2022-04-24 MED ORDER — FAMOTIDINE 40 MG PO TABS: 40.0000 mg | ORAL_TABLET | Freq: Every day | ORAL | 3 refills | Status: DC

## 2022-04-24 NOTE — Progress Notes (Signed)
Subjective:  Patient ID: Sergio Hughes, male    DOB: Jan 13, 1941  Age: 81 y.o. MRN: [id number]  CC: No chief complaint on file.   HPI Sergio Hughes presents for a number of side effects that happened after he had to go Uroxatrol.  Sergio Hughes started to go to [facility] He got straps to improve his posture.  Seeing his urologist: he started Uroxatrol - he took it once at hs, next morning he felt weak cutting shrubs. He developed "pulse in the ears" sensation. He stopped Uroxatrol in 6 days after starting it. Still having tinnitus in both ears, more on the right. He had some mild GERD symptoms lately  Outpatient Medications Prior to Visit  Medication Sig Dispense Refill   amLODipine (NORVASC) 2.5 MG tablet TAKE 1 TABLET EVERY DAY (KEEP DECEMBER APPOINTMENT FOR FUTURE REFILLS) 90 tablet 0   aspirin 81 MG EC tablet Take 81 mg by mouth daily.     Cholecalciferol (VITAMIN D) 2000 UNITS CAPS Take 1 capsule by mouth daily.     Glucosamine-Chondroit-Vit C-Mn (GLUCOSAMINE 1500 COMPLEX) CAPS Take by mouth.     Omega-3 Fatty Acids (FISH OIL) 1000 MG CAPS      pravastatin (PRAVACHOL) 20 MG tablet TAKE 1 TABLET EVERY DAY 90 tablet 3   alfuzosin (UROXATRAL) 10 MG 24 hr tablet Take 1 tablet (10 mg total) by mouth at bedtime. 30 tablet 11   No facility-administered medications prior to visit.    ROS: Review of Systems  Constitutional:  Negative for fatigue.  HENT:  Positive for tinnitus. Negative for ear pain and hearing loss.   Psychiatric/Behavioral:  The patient is not nervous/anxious.    Objective:  BP 120/74 (BP Location: Right Arm, Patient Position: Sitting, Cuff Size: Large)   Pulse 68   Ht '5\' 5"'$  (1.651 m)   Wt 142 lb (64.4 kg)   SpO2 96%   BMI 23.63 kg/m   BP Readings from Last 3 Encounters:  04/24/22 120/74  04/08/22 126/71  03/09/22 107/60    Wt Readings from Last 3 Encounters:  04/24/22 142 lb (64.4 kg)  04/08/22 142 lb 6.4 oz (64.6 kg)  10/23/21 142 lb 6.4 oz (64.6 kg)    Physical  Exam Constitutional:      General: He is not in acute distress.    Appearance: Normal appearance. He is well-developed.     Comments: NAD  Eyes:     Conjunctiva/sclera: Conjunctivae normal.     Pupils: Pupils are equal, round, and reactive to light.  Neck:     Thyroid: No thyromegaly.     Vascular: No JVD.  Cardiovascular:     Rate and Rhythm: Normal rate and regular rhythm.     Heart sounds: Normal heart sounds. No murmur heard.   No friction rub. No gallop.  Pulmonary:     Effort: Pulmonary effort is normal. No respiratory distress.     Breath sounds: Normal breath sounds. No wheezing or rales.  Chest:     Chest wall: No tenderness.  Abdominal:     General: Bowel sounds are normal. There is no distension.     Palpations: Abdomen is soft. There is no mass.     Tenderness: There is no abdominal tenderness. There is no guarding or rebound.  Musculoskeletal:        General: No tenderness. Normal range of motion.     Cervical back: Normal range of motion.  Lymphadenopathy:     Cervical: No cervical adenopathy.  Skin:  General: Skin is warm and dry.     Findings: No rash.  Neurological:     Mental Status: He is alert and oriented to person, place, and time.     Cranial Nerves: No cranial nerve deficit.     Motor: No abnormal muscle tone.     Coordination: Coordination normal.     Gait: Gait normal.     Deep Tendon Reflexes: Reflexes are normal and symmetric.  Psychiatric:        Behavior: Behavior normal.        Thought Content: Thought content normal.        Judgment: Judgment normal.  Fluid behind B TMs Cervical spine with decreased range of motion, firm right trap muscle  Lab Results  Component Value Date   WBC 11.5 (H) 10/23/2021   HGB 13.9 10/23/2021   HCT 42.5 10/23/2021   PLT 201.0 Repeated and verified X2. 10/23/2021   GLUCOSE 100 (H) 10/23/2021   CHOL 181 10/23/2021   TRIG 76.0 10/23/2021   HDL 82.20 10/23/2021   LDLDIRECT 144.9 11/14/2013   LDLCALC 83  10/23/2021   ALT 15 10/23/2021   AST 22 10/23/2021   NA 139 10/23/2021   K 4.5 10/23/2021   CL 105 10/23/2021   CREATININE 2.29 (H) 10/23/2021   BUN 51 (H) 10/23/2021   CO2 26 10/23/2021   TSH 0.59 10/23/2021   PSA 11.03 (H) 10/23/2021   INR 1.02 12/09/2015   HGBA1C 5.8 (H) 12/09/2015    No results found.  Assessment & Plan:   Problem List Items Addressed This Visit     Bilateral carotid bruits - Primary    Check Doppler US       Relevant Orders   US Carotid Bilateral   Elevated PSA    Prostate bx next week       GERD (gastroesophageal reflux disease)    Rare Use Pepcid prn       Relevant Medications   famotidine (PEPCID) 40 MG tablet   Neck pain on right side    Use rice heating pad Blue-Emu cream was recommended to use 2-3 times a day      Tinnitus    It was triggered by Uraxotral - d/c'd Treat allergies  Claritin, Afrin           Meds ordered this encounter  Medications   famotidine (PEPCID) 40 MG tablet    Sig: Take 1 tablet (40 mg total) by mouth daily.    Dispense:  30 tablet    Refill:  3   loratadine (CLARITIN) 10 MG tablet    Sig: Take 1 tablet (10 mg total) by mouth daily.    Dispense:  30 tablet    Refill:  3      Follow-up: No follow-ups on file.  Walker Kehr, MD

## 2022-04-24 NOTE — Assessment & Plan Note (Signed)
Use rice heating pad Blue-Emu cream was recommended to use 2-3 times a day

## 2022-04-24 NOTE — Assessment & Plan Note (Signed)
Prostate bx next week

## 2022-04-24 NOTE — Assessment & Plan Note (Signed)
It was triggered by Uraxotral - d/c'd Treat allergies  Claritin, Afrin

## 2022-04-24 NOTE — Assessment & Plan Note (Signed)
Rare Use Pepcid prn

## 2022-04-24 NOTE — Patient Instructions (Signed)
Use rice heating pad Blue-Emu cream was recommended to use 2-3 times a day

## 2022-04-24 NOTE — Assessment & Plan Note (Signed)
Check Doppler US 

## 2022-04-28 ENCOUNTER — Ambulatory Visit: Payer: Medicare HMO | Admitting: Internal Medicine

## 2022-04-29 ENCOUNTER — Encounter: Payer: Self-pay | Admitting: Urology

## 2022-04-29 ENCOUNTER — Other Ambulatory Visit: Payer: Self-pay | Admitting: Urology

## 2022-04-29 ENCOUNTER — Encounter (HOSPITAL_COMMUNITY): Payer: Self-pay

## 2022-04-29 ENCOUNTER — Ambulatory Visit (HOSPITAL_BASED_OUTPATIENT_CLINIC_OR_DEPARTMENT_OTHER): Payer: Medicare HMO | Admitting: Urology

## 2022-04-29 ENCOUNTER — Ambulatory Visit (HOSPITAL_COMMUNITY)
Admission: RE | Admit: 2022-04-29 | Discharge: 2022-04-29 | Disposition: A | Discharge status: Home / Self Care | Payer: Medicare HMO | Source: Ambulatory Visit | Attending: Urology | Admitting: Urology

## 2022-04-29 DIAGNOSIS — R972 Elevated prostate specific antigen [PSA]: Secondary | ICD-10-CM

## 2022-04-29 DIAGNOSIS — C61 Malignant neoplasm of prostate: Secondary | ICD-10-CM

## 2022-04-29 MED ORDER — LIDOCAINE HCL (PF) 2 % IJ SOLN
INTRAMUSCULAR | Status: AC
  Administered 2022-04-29: 10 mL
  Filled 2022-04-29: qty 10

## 2022-04-29 MED ORDER — GENTAMICIN SULFATE 40 MG/ML IJ SOLN
INTRAMUSCULAR | Status: AC
  Administered 2022-04-29: 80 mg via INTRAMUSCULAR
  Filled 2022-04-29: qty 2

## 2022-04-29 MED ORDER — LIDOCAINE HCL (PF) 2 % IJ SOLN: 10.0000 mL | Freq: Once | INTRAMUSCULAR | Status: AC

## 2022-04-29 MED ORDER — GENTAMICIN SULFATE 40 MG/ML IJ SOLN: 80.0000 mg | Freq: Once | INTRAMUSCULAR | Status: AC

## 2022-04-29 NOTE — Progress Notes (Signed)
Prostate Biopsy Procedure   Informed consent was obtained after discussing risks/benefits of the procedure.  A time out was performed to ensure correct patient identity.  Pre-Procedure: - Last PSA Level:  Lab Results  Component Value Date   PSA 11.03 (H) 10/23/2021   PSA 6.35 (H) 09/29/2017   PSA 6.51 (H) 05/24/2017   - Gentamicin given prophylactically - Levaquin 500 mg administered PO -Transrectal Ultrasound performed revealing a 27.6 gm prostate -No significant hypoechoic or median lobe noted  Procedure: - Prostate block performed using 10 cc 1% lidocaine and biopsies taken from sextant areas, a total of 12 under ultrasound guidance.  Post-Procedure: - Patient tolerated the procedure well - He was counseled to seek immediate medical attention if experiences any severe pain, significant bleeding, or fevers - Return in one week to discuss biopsy results

## 2022-04-29 NOTE — Progress Notes (Signed)
PT tolerated prostate biopsy procedure and antibiotic injection well today. Labs obtained and sent for pathology. PT ambulatory at discharge with no acute distress noted and verbalized understanding of discharge instructions. PT to follow up with urologist as scheduled on 05/13/22 @ 3:45 pm.

## 2022-04-29 NOTE — Patient Instructions (Signed)
Transrectal Ultrasound-Guided Prostate Biopsy, Care After What can I expect after the procedure? After the procedure, it is common to have: Pain and discomfort near your butt (rectum), especially while sitting. Pink-colored pee (urine). This is due to small amounts of blood in your pee. A burning feeling while peeing. Blood in your poop (stool). Bleeding from your butt. Blood in your semen. Follow these instructions at home: Medicines Take over-the-counter and prescription medicines only as told by your doctor. If you were given a sedative during your procedure, do not drive or use machines until your doctor says that it is safe. A sedative is a medicine that helps you relax. If you were prescribed an antibiotic medicine, take it as told by your doctor. Do not stop taking it even if you start to feel better. Activity  Return to your normal activities when your doctor says that it is safe. Ask your doctor when it is okay for you to have sex. You may have to avoid lifting. Ask your doctor how much you can safely lift. General instructions  Drink enough water to keep your pee pale yellow. Watch your pee, poop, and semen for new bleeding or bleeding that gets worse. Keep all follow-up visits. Contact a doctor if: You have any of these: Blood clots in your pee or poop. Blood in your pee more than 2 weeks after the procedure. Blood in your semen more than 2 months after the procedure. New or worse bleeding in your pee, poop, or semen. Very bad belly pain. Your pee smells bad or unusual. You have trouble peeing. Your lower belly feels firm. You have problems getting an erection. You feel like you may vomit (are nauseous), or you vomit. Get help right away if: You have a fever or chills. You have bright red pee. You have very bad pain that does not get better with medicine. You cannot pee. Summary After this procedure, it is common to have pain and discomfort near your butt,  especially while sitting. You may have blood in your pee and poop. It is common to have blood in your semen. Get help right away if you have a fever or chills. This information is not intended to replace advice given to you by your health care provider. Make sure you discuss any questions you have with your health care provider. Document Revised: 05/05/2021 Document Reviewed: 05/05/2021 Elsevier Patient Education  2023 [employer]  

## 2022-05-04 DIAGNOSIS — I129 Hypertensive chronic kidney disease with stage 1 through stage 4 chronic kidney disease, or unspecified chronic kidney disease: Secondary | ICD-10-CM | POA: Diagnosis not present

## 2022-05-04 DIAGNOSIS — R809 Proteinuria, unspecified: Secondary | ICD-10-CM | POA: Diagnosis not present

## 2022-05-04 DIAGNOSIS — N184 Chronic kidney disease, stage 4 (severe): Secondary | ICD-10-CM | POA: Diagnosis not present

## 2022-05-06 ENCOUNTER — Other Ambulatory Visit (HOSPITAL_COMMUNITY): Payer: Self-pay | Admitting: Nephrology

## 2022-05-06 ENCOUNTER — Other Ambulatory Visit: Payer: Self-pay | Admitting: Nephrology

## 2022-05-06 DIAGNOSIS — N184 Chronic kidney disease, stage 4 (severe): Secondary | ICD-10-CM

## 2022-05-07 ENCOUNTER — Encounter: Payer: Self-pay | Admitting: Internal Medicine

## 2022-05-07 ENCOUNTER — Ambulatory Visit: Payer: Medicare HMO | Admitting: Internal Medicine

## 2022-05-11 ENCOUNTER — Other Ambulatory Visit: Payer: Self-pay | Admitting: Internal Medicine

## 2022-05-11 DIAGNOSIS — R0989 Other specified symptoms and signs involving the circulatory and respiratory systems: Secondary | ICD-10-CM

## 2022-05-13 ENCOUNTER — Encounter: Payer: Self-pay | Admitting: Urology

## 2022-05-13 ENCOUNTER — Ambulatory Visit: Payer: Medicare HMO | Admitting: Urology

## 2022-05-13 VITALS — BP 123/63 | HR 63

## 2022-05-13 DIAGNOSIS — R972 Elevated prostate specific antigen [PSA]: Secondary | ICD-10-CM | POA: Diagnosis not present

## 2022-05-13 DIAGNOSIS — C61 Malignant neoplasm of prostate: Secondary | ICD-10-CM

## 2022-05-13 NOTE — Patient Instructions (Signed)

## 2022-05-13 NOTE — Progress Notes (Unsigned)
05/13/2022 4:22 PM   Laurice Record Paulla Dolly 06/09/1941 [id number]  Referring provider: Cassandria Anger, MD [address] [place],  [place] [address]  No chief complaint on file.   HPI: Pathology revelaed Gleason 3+4=7 in 1/12 cores, 70% of core. PSA 11.9. He has mild LUTS   PMH: Past Medical History:  Diagnosis Date   Coronary artery calcification seen on CT scan    Hyperlipidemia    Hypertension    Osteoarthritis     Surgical History: Past Surgical History:  Procedure Laterality Date   COLON SURGERY     COLONOSCOPY     INGUINAL HERNIA REPAIR  2005   Right   LESION REMOVAL Left 09/23/2020   Procedure: MINOR EXICISION OF LESION OF LEFT EAR;  Surgeon: Izora Gala, MD;  Location: [facility];  Service: ENT;  Laterality: Left;   PARTIAL COLECTOMY Right 12/16/2015   Procedure: PARTIAL CECECTOMY AND APPENDECTOMY;  Surgeon: Judeth Horn, MD;  Location: [facility];  Service: General;  Laterality: Right;   POLYPECTOMY     VASECTOMY      Home Medications:  Allergies as of 05/13/2022       Reactions   Bupropion Hcl    REACTION: extreme dizziness   Lovastatin    myalgias   Uroxatral [alfuzosin]    Weakness, tinnitus        Medication List        Accurate as of May 13, 2022  4:22 PM. If you have any questions, ask your nurse or doctor.          amLODipine 2.5 MG tablet Commonly known as: NORVASC TAKE 1 TABLET EVERY DAY (KEEP DECEMBER APPOINTMENT FOR FUTURE REFILLS)   aspirin EC 81 MG tablet Take 81 mg by mouth daily.   famotidine 40 MG tablet Commonly known as: Pepcid Take 1 tablet (40 mg total) by mouth daily.   Fish Oil 1000 MG Caps   Glucosamine 1500 Complex Caps Take by mouth.   loratadine 10 MG tablet Commonly known as: CLARITIN Take 1 tablet (10 mg total) by mouth daily.   pravastatin 20 MG tablet Commonly known as: PRAVACHOL TAKE 1 TABLET EVERY DAY   Vitamin D 50 MCG (2000 UT) Caps Take 1 capsule by mouth daily.         Allergies:  Allergies  Allergen Reactions   Bupropion Hcl     REACTION: extreme dizziness   Lovastatin     myalgias   Uroxatral [Alfuzosin]     Weakness, tinnitus    Family History: Family History  Problem Relation Age of Onset   Hypertension Father    Cancer Father        brain ca   Prostate cancer Brother 33   Colon cancer Neg Hx    Rectal cancer Neg Hx    Stomach cancer Neg Hx    Esophageal cancer Neg Hx     Social History:  reports that he quit smoking about 56 years ago. His smoking use included cigarettes. He has never used smokeless tobacco. He reports current alcohol use of about 21.0 standard drinks of alcohol per week. He reports that he does not use drugs.  ROS: All other review of systems were reviewed and are negative except what is noted above in HPI  Physical Exam: BP 123/63   Pulse 63   Constitutional:  Alert and oriented, No acute distress. HEENT: [place] AT, moist mucus membranes.  Trachea midline, no masses. Cardiovascular: No clubbing, cyanosis, or edema.  Respiratory: Normal respiratory effort, no increased work of breathing. GI: Abdomen is soft, nontender, nondistended, no abdominal masses GU: No CVA tenderness.  Lymph: No cervical or inguinal lymphadenopathy. Skin: No rashes, bruises or suspicious lesions. Neurologic: Grossly intact, no focal deficits, moving all 4 extremities. Psychiatric: Normal mood and affect.  Laboratory Data: Lab Results  Component Value Date   WBC 11.5 (H) 10/23/2021   HGB 13.9 10/23/2021   HCT 42.5 10/23/2021   MCV 91.4 10/23/2021   PLT 201.0 Repeated and verified X2. 10/23/2021    Lab Results  Component Value Date   CREATININE 2.29 (H) 10/23/2021    Lab Results  Component Value Date   PSA 11.03 (H) 10/23/2021   PSA 6.35 (H) 09/29/2017   PSA 6.51 (H) 05/24/2017    No results found for: "TESTOSTERONE"  Lab Results  Component Value Date   HGBA1C 5.8 (H) 12/09/2015    Urinalysis    Component Value  Date/Time   COLORURINE YELLOW 10/23/2021 1016   APPEARANCEUR Clear 04/08/2022 0935   LABSPEC 1.020 10/23/2021 1016   PHURINE 5.5 10/23/2021 1016   GLUCOSEU Negative 04/08/2022 0935   GLUCOSEU NEGATIVE 10/23/2021 1016   HGBUR NEGATIVE 10/23/2021 1016   BILIRUBINUR Negative 04/08/2022 0935   KETONESUR NEGATIVE 10/23/2021 1016   PROTEINUR Trace (A) 04/08/2022 0935   UROBILINOGEN 0.2 10/23/2021 1016   NITRITE Negative 04/08/2022 0935   NITRITE NEGATIVE 10/23/2021 1016   LEUKOCYTESUR Negative 04/08/2022 0935   LEUKOCYTESUR NEGATIVE 10/23/2021 1016    Lab Results  Component Value Date   LABMICR Comment 04/08/2022   WBCUA 0-5 03/09/2022   LABEPIT None seen 03/09/2022   BACTERIA None seen 03/09/2022    Pertinent Imaging: *** No results found for this or any previous visit.  No results found for this or any previous visit.  No results found for this or any previous visit.  No results found for this or any previous visit.  Results for orders placed during the hospital encounter of 05/10/20  US Renal  Narrative CLINICAL DATA:  Initial evaluation for chronic renal insufficiency, proteinuria.  EXAM: RENAL / URINARY TRACT ULTRASOUND COMPLETE  COMPARISON:  None available.  FINDINGS: Right Kidney:  Renal measurements: 11.3 x 3.6 x 3.3 cm = volume: 75.6 mL. Diffuse cortical thinning with markedly increased echogenicity within the renal parenchyma and poor corticomedullary differentiation. No nephrolithiasis or hydronephrosis. No focal renal mass.  Left Kidney:  Renal measurements: 11.3 x 4.6 x 4.5 cm = volume: 122.3 mL. Diffuse cortical thinning with markedly increased echogenicity within the renal parenchyma and poor corticomedullary differentiation. No nephrolithiasis or hydronephrosis. No focal renal mass.  Bladder:  Appears normal for degree of bladder distention.  Other:  Enlarged prostate measuring up to 5.7 cm noted.  Incidental note made of multiple  echogenic stones within the gallbladder lumen. No other imaging findings to suggest acute cholecystitis.  IMPRESSION: 1. Diffuse cortical thinning with markedly increased echogenicity within the renal parenchyma, consistent with chronic medical renal disease. 2. No hydronephrosis. 3. Enlarged prostate. 4. Incidental cholelithiasis.   Electronically Signed By: Jeannine Boga M.D. On: 05/10/2020 23:30  No results found for this or any previous visit.  No results found for this or any previous visit.  No results found for this or any previous visit.   Assessment & Plan:    1. Prostate Cancer I discussed the natural history of favorable intermediate risk prostate cancer with the patient and the various treatment options including active surveillance, RALP, IMRT, brachytherapy, cryotherapy, HIFU  and ADT. After discussing the options the patient elects for Radiation Therapy. I will refer him to Dr. Tammi Klippel. I have ordered a PSMA PET prior to his appointment.     No follow-ups on file.  Nicolette Bang, MD  [facility] [place]

## 2022-05-14 ENCOUNTER — Ambulatory Visit (HOSPITAL_COMMUNITY)
Admission: RE | Admit: 2022-05-14 | Discharge: 2022-05-14 | Disposition: A | Discharge status: Home / Self Care | Payer: Medicare HMO | Source: Ambulatory Visit | Attending: Nephrology | Admitting: Nephrology

## 2022-05-14 DIAGNOSIS — N184 Chronic kidney disease, stage 4 (severe): Secondary | ICD-10-CM | POA: Diagnosis not present

## 2022-05-14 DIAGNOSIS — N189 Chronic kidney disease, unspecified: Secondary | ICD-10-CM | POA: Diagnosis not present

## 2022-05-14 DIAGNOSIS — I6523 Occlusion and stenosis of bilateral carotid arteries: Secondary | ICD-10-CM | POA: Diagnosis not present

## 2022-05-14 DIAGNOSIS — R0989 Other specified symptoms and signs involving the circulatory and respiratory systems: Secondary | ICD-10-CM | POA: Insufficient documentation

## 2022-05-14 DIAGNOSIS — I771 Stricture of artery: Secondary | ICD-10-CM | POA: Diagnosis not present

## 2022-05-21 ENCOUNTER — Ambulatory Visit (HOSPITAL_COMMUNITY)
Admission: RE | Admit: 2022-05-21 | Discharge: 2022-05-21 | Disposition: A | Discharge status: Home / Self Care | Payer: Medicare HMO | Source: Ambulatory Visit | Attending: Urology | Admitting: Urology

## 2022-05-21 ENCOUNTER — Encounter (HOSPITAL_COMMUNITY): Admission: RE | Admit: 2022-05-21 | Payer: Medicare HMO | Source: Ambulatory Visit

## 2022-05-21 DIAGNOSIS — C61 Malignant neoplasm of prostate: Secondary | ICD-10-CM | POA: Diagnosis not present

## 2022-05-21 DIAGNOSIS — K8020 Calculus of gallbladder without cholecystitis without obstruction: Secondary | ICD-10-CM | POA: Diagnosis not present

## 2022-05-21 DIAGNOSIS — I2510 Atherosclerotic heart disease of native coronary artery without angina pectoris: Secondary | ICD-10-CM | POA: Diagnosis not present

## 2022-05-21 DIAGNOSIS — K5730 Diverticulosis of large intestine without perforation or abscess without bleeding: Secondary | ICD-10-CM | POA: Diagnosis not present

## 2022-05-21 MED ORDER — GALLIUM GA 68 GOZETOTIDE 25 MCG IV KIT: 9.1900 | PACK | Freq: Once | INTRAVENOUS | Status: DC

## 2022-05-21 MED ORDER — PIFLIFOLASTAT F 18 (PYLARIFY) INJECTION
9.0000 | Freq: Once | INTRAVENOUS | Status: AC
  Administered 2022-05-21: 9.19 via INTRAVENOUS

## 2022-05-27 NOTE — Progress Notes (Signed)
GU Location of Tumor / Histology: Prostate Ca  If Prostate Cancer, Gleason Score is (3 + 4) and PSA is (11.9 as of 05/13/2022)  05/13/2022 Dr. Alyson Ingles NM PET Skull To Mid Thigh CLINICAL DATA:  Intermediate risk prostate carcinoma.  Staging.  FINDINGS:  NECK  No radiotracer activity in neck lymph nodes. Incidental CT finding: None   CHEST No radiotracer accumulation within mediastinal or hilar lymph nodes.  No suspicious pulmonary nodules on the CT scan.   Incidental CT finding: Aortic and coronary atherosclerotic calcification incidentally noted.   ABDOMEN/PELVIS Prostate: Focal intense radiotracer uptake is seen in the anterior prostate, with SUV max of 10.8. This is consistent with primary prostate carcinoma.   Lymph nodes: No abnormal radiotracer accumulation within pelvic or abdominal nodes.   Liver: No evidence of liver metastasis   Incidental CT finding: Aortic atherosclerotic calcification incidentally noted. Tiny calcified gallstones noted, without evidence of cholecystitis or biliary ductal dilatation. Left colonic diverticulosis is noted, without evidence of diverticulitis.   SKELETON No focal activity to suggest skeletal metastasis. Thoracic spine degenerative changes and dextroscoliosis noted.   IMPRESSION: Focal intense radiotracer uptake in the anterior prostate, consistent with primary prostate carcinoma. No evidence of local or distant metastatic disease. Cholelithiasis. No radiographic evidence of cholecystitis.  Colonic diverticulosis, without radiographic evidence of diverticulitis.  Past/Anticipated interventions by urology, if any: NA  Past/Anticipated interventions by medical oncology, if any: NA  Weight changes, if any: No  IPSS:  15  Bowel/Bladder complaints, if any:  No  Nausea/Vomiting, if any: No  Pain issues, if any:  0/10  SAFETY ISSUES: Prior radiation? No Pacemaker/ICD? No Possible current pregnancy? Male Is the patient on  methotrexate? No  Current Complaints / other details:

## 2022-05-28 ENCOUNTER — Ambulatory Visit
Admission: RE | Admit: 2022-05-28 | Discharge: 2022-05-28 | Disposition: A | Discharge status: Home / Self Care | Payer: Medicare HMO | Source: Ambulatory Visit | Attending: Radiation Oncology | Admitting: Radiation Oncology

## 2022-05-28 ENCOUNTER — Other Ambulatory Visit: Payer: Self-pay

## 2022-05-28 DIAGNOSIS — Z191 Hormone sensitive malignancy status: Secondary | ICD-10-CM | POA: Diagnosis not present

## 2022-05-28 DIAGNOSIS — C61 Malignant neoplasm of prostate: Secondary | ICD-10-CM | POA: Insufficient documentation

## 2022-05-28 NOTE — Progress Notes (Signed)
Radiation Oncology         [phone] ________________________________  Initial Outpatient Consultation- Conducted via MyChart video due to current COVID-19 concerns for limiting patient exposure  Name: Sergio Hughes MRN: [id number]  Date: 05/28/2022  DOB: [birth year]  VF:IEPPIRJJO, Sergio Lacks, MD  Sergio Hughes, Sergio Furbish, MD   REFERRING PHYSICIAN: Cleon Gustin, MD  DIAGNOSIS: 81 y.o. gentleman with Stage T1c adenocarcinoma of the prostate with Gleason score of 3+4, and PSA of 11.9.    ICD-10-CM   1. Malignant neoplasm of prostate ([place])  C61       HISTORY OF PRESENT ILLNESS: Sergio Hughes is a 81 y.o. male with a diagnosis of prostate cancer.  He is an established urology patient, previously followed by Dr. Alyson Hughes for elevated, fluctuating PSA ranging between 5-6 since at least 2017 but DRE's remained normal so he did not have any prostate biopsies.  More recently, he was noted to have an elevated PSA of 11.03 on 10/23/2021 by his primary care physician, Dr. Alain Hughes.  Accordingly, he was referred back to urology for evaluation by Dr. Alyson Hughes on 03/09/2022,  digital rectal examination was performed at that time revealing no nodularity or concerning findings.  A repeat PSA that day was further elevated at 11.9 and remained elevated at 11.9 when repeated again on 04/01/2022.  The patient proceeded to transrectal ultrasound with 12 biopsies of the prostate on 04/29/2022.  The prostate volume measured 27.6 cc.  Out of 12 core biopsies, 1 was positive.  The maximum Gleason score was 3+4, and this was seen in the right base lateral involving 70% of the sample.  A PSMA PET scan was performed on 05/21/2022 for disease staging and did not show any evidence of metastatic disease.  The patient reviewed the biopsy results with his urologist and he has kindly been referred today for discussion of potential radiation treatment options.   PREVIOUS RADIATION THERAPY: No  PAST MEDICAL HISTORY:  Past  Medical History:  Diagnosis Date   Coronary artery calcification seen on CT scan    Hyperlipidemia    Hypertension    Osteoarthritis       PAST SURGICAL HISTORY: Past Surgical History:  Procedure Laterality Date   COLON SURGERY     COLONOSCOPY     INGUINAL HERNIA REPAIR  2005   Right   LESION REMOVAL Left 09/23/2020   Procedure: MINOR EXICISION OF LESION OF LEFT EAR;  Surgeon: Sergio Gala, MD;  Location: [facility];  Service: ENT;  Laterality: Left;   PARTIAL COLECTOMY Right 12/16/2015   Procedure: PARTIAL CECECTOMY AND APPENDECTOMY;  Surgeon: Sergio Horn, MD;  Location: [facility];  Service: General;  Laterality: Right;   POLYPECTOMY     VASECTOMY      FAMILY HISTORY:  Family History  Problem Relation Age of Onset   Hypertension Father    Cancer Father        brain ca   Prostate cancer Brother 66   Colon cancer Neg Hx    Rectal cancer Neg Hx    Stomach cancer Neg Hx    Esophageal cancer Neg Hx     SOCIAL HISTORY:  Social History   Socioeconomic History   Marital status: Married    Spouse name: Sergio Hughes   Number of children: Not on file   Years of education: college    Highest education level: Not on file  Occupational History   Occupation: retired  Tobacco Use   Smoking status: Former  Types: Cigarettes    Quit date: 12/14/1965    Years since quitting: 56.4   Smokeless tobacco: Never  Vaping Use   Vaping Use: Never used  Substance and Sexual Activity   Alcohol use: Yes    Alcohol/week: 21.0 standard drinks of alcohol    Types: 7 Glasses of wine, 14 Standard drinks or equivalent per week    Comment: 1-2 glasses of wine or beer each night   Drug use: No   Sexual activity: Not Currently  Other Topics Concern   Not on file  Social History Narrative   Regular exercise - YES - golf   Social Determinants of Health   Financial Resource Strain: Low Risk  (12/12/2021)   Overall Financial Resource Strain (CARDIA)    Difficulty of Paying Living  Expenses: Not hard at all  Food Insecurity: No Food Insecurity (12/12/2021)   Hunger Vital Sign    Worried About Running Out of Food in the Last Year: Never true    Ran Out of Food in the Last Year: Never true  Transportation Needs: No Transportation Needs (12/12/2021)   PRAPARE - Hydrologist (Medical): No    Lack of Transportation (Non-Medical): No  Physical Activity: Insufficiently Active (12/12/2021)   Exercise Vital Sign    Days of Exercise per Week: 3 days    Minutes of Exercise per Session: 30 min  Stress: No Stress Concern Present (12/12/2021)   [place]    Feeling of Stress : Not at all  Social Connections: Moderately Integrated (12/12/2021)   Social Connection and Isolation Panel [NHANES]    Frequency of Communication with Friends and Family: Three times a week    Frequency of Social Gatherings with Friends and Family: Three times a week    Attends Religious Services: Never    Active Member of Clubs or Organizations: Yes    Attends Archivist Meetings: More than 4 times per year    Marital Status: Married  Human resources officer Violence: Not At Risk (12/12/2021)   Humiliation, Afraid, Rape, and Kick questionnaire    Fear of Current or Ex-Partner: No    Emotionally Abused: No    Physically Abused: No    Sexually Abused: No    ALLERGIES: Bupropion hcl, Lovastatin, and Uroxatral [alfuzosin]  MEDICATIONS:  Current Outpatient Medications  Medication Sig Dispense Refill   amLODipine (NORVASC) 2.5 MG tablet TAKE 1 TABLET EVERY DAY (KEEP DECEMBER APPOINTMENT FOR FUTURE REFILLS) 90 tablet 0   aspirin 81 MG EC tablet Take 81 mg by mouth daily.     Cholecalciferol (VITAMIN D) 2000 UNITS CAPS Take 1 capsule by mouth daily.     famotidine (PEPCID) 40 MG tablet Take 1 tablet (40 mg total) by mouth daily. 30 tablet 3   Glucosamine-Chondroit-Vit C-Mn (GLUCOSAMINE 1500 COMPLEX) CAPS Take  by mouth.     loratadine (CLARITIN) 10 MG tablet Take 1 tablet (10 mg total) by mouth daily. 30 tablet 3   Omega-3 Fatty Acids (FISH OIL) 1000 MG CAPS      pravastatin (PRAVACHOL) 20 MG tablet TAKE 1 TABLET EVERY DAY 90 tablet 3   No current facility-administered medications for this visit.    REVIEW OF SYSTEMS:  On review of systems, the patient reports that he is doing well overall. He denies any chest pain, shortness of breath, cough, fevers, chills, night sweats, unintended weight changes. He denies any bowel disturbances, and denies abdominal pain, nausea  or vomiting. He denies any new musculoskeletal or joint aches or pains. His IPSS was 15, indicating moderate urinary symptoms with nocturia x3, weak stream, intermittency, urgency and frequency. He has tried Uroxatral but could not tolerate due to weakness and dizziness. A complete review of systems is obtained and is otherwise negative.    PHYSICAL EXAM:  Wt Readings from Last 3 Encounters:  04/24/22 142 lb (64.4 kg)  04/08/22 142 lb 6.4 oz (64.6 kg)  10/23/21 142 lb 6.4 oz (64.6 kg)   Temp Readings from Last 3 Encounters:  04/29/22 97.8 F (36.6 C) (Oral)  10/23/21 97.7 F (36.5 C) (Oral)  10/21/20 97.8 F (36.6 C)   BP Readings from Last 3 Encounters:  05/13/22 123/63  04/29/22 (!) 144/69  04/24/22 120/74   Pulse Readings from Last 3 Encounters:  05/13/22 63  04/29/22 68  04/24/22 68    /10  In general this is a well appearing Caucasian male in no acute distress. He's alert and oriented x4 and appropriate throughout the examination. Cardiopulmonary assessment is negative for acute distress, and he exhibits normal effort.     KPS = 100  100 - Normal; no complaints; no evidence of disease. 90   - Able to carry on normal activity; minor signs or symptoms of disease. 80   - Normal activity with effort; some signs or symptoms of disease. 60   - Cares for self; unable to carry on normal activity or to do active  work. 60   - Requires occasional assistance, but is able to care for most of his personal needs. 50   - Requires considerable assistance and frequent medical care. 78   - Disabled; requires special care and assistance. 41   - Severely disabled; hospital admission is indicated although death not imminent. 93   - Very sick; hospital admission necessary; active supportive treatment necessary. 10   - Moribund; fatal processes progressing rapidly. 0     - Dead  Karnofsky DA, Abelmann [place], Craver LS and Burchenal [facility] [phone]) The use of the nitrogen mustards in the palliative treatment of carcinoma: with particular reference to bronchogenic carcinoma Cancer 1 634-56  LABORATORY DATA:  Lab Results  Component Value Date   WBC 11.5 (H) 10/23/2021   HGB 13.9 10/23/2021   HCT 42.5 10/23/2021   MCV 91.4 10/23/2021   PLT 201.0 Repeated and verified X2. 10/23/2021   Lab Results  Component Value Date   NA 139 10/23/2021   K 4.5 10/23/2021   CL 105 10/23/2021   CO2 26 10/23/2021   Lab Results  Component Value Date   ALT 15 10/23/2021   AST 22 10/23/2021   ALKPHOS 76 10/23/2021   BILITOT 0.5 10/23/2021     RADIOGRAPHY: NM PET (PSMA) SKULL TO MID THIGH  Result Date: 05/22/2022 CLINICAL DATA:  Intermediate risk prostate carcinoma.  Staging. EXAM: NUCLEAR MEDICINE PET SKULL BASE TO THIGH TECHNIQUE: 9.2 mCi F18 Piflufolastat (Pylarify) was injected intravenously. Full-ring PET imaging was performed from the skull base to thigh after the radiotracer. CT data was obtained and used for attenuation correction and anatomic localization. COMPARISON:  None Available. FINDINGS: NECK No radiotracer activity in neck lymph nodes. Incidental CT finding: None CHEST No radiotracer accumulation within mediastinal or hilar lymph nodes. No suspicious pulmonary nodules on the CT scan. Incidental CT finding: Aortic and coronary atherosclerotic calcification incidentally noted. ABDOMEN/PELVIS Prostate: Focal intense  radiotracer uptake is seen in the anterior prostate, with SUV max of 10.8. This is consistent  with primary prostate carcinoma. Lymph nodes: No abnormal radiotracer accumulation within pelvic or abdominal nodes. Liver: No evidence of liver metastasis Incidental CT finding: Aortic atherosclerotic calcification incidentally noted. Tiny calcified gallstones noted, without evidence of cholecystitis or biliary ductal dilatation. Left colonic diverticulosis is noted, without evidence of diverticulitis. SKELETON No focal activity to suggest skeletal metastasis. Thoracic spine degenerative changes and dextroscoliosis noted. IMPRESSION: Focal intense radiotracer uptake in the anterior prostate, consistent with primary prostate carcinoma. No evidence of local or distant metastatic disease. Cholelithiasis. No radiographic evidence of cholecystitis. Colonic diverticulosis, without radiographic evidence of diverticulitis. Electronically Signed   By: Marlaine Hind M.D.   On: 05/22/2022 15:10   US Carotid Bilateral  Result Date: 05/14/2022 CLINICAL DATA:  81 year old male with a history of bilateral carotid bruit EXAM: BILATERAL CAROTID DUPLEX ULTRASOUND TECHNIQUE: Pearline Cables scale imaging, color Doppler and duplex ultrasound were performed of bilateral carotid and vertebral arteries in the neck. COMPARISON:  None Available. FINDINGS: Criteria: Quantification of carotid stenosis is based on velocity parameters that correlate the residual internal carotid diameter with NASCET-based stenosis levels, using the diameter of the distal internal carotid lumen as the denominator for stenosis measurement. The following velocity measurements were obtained: RIGHT ICA:  Systolic 56 cm/sec, Diastolic 17 cm/sec CCA:  54 cm/sec SYSTOLIC ICA/CCA RATIO:  1.0 ECA:  61 cm/sec LEFT ICA:  Systolic 44 cm/sec, Diastolic 16 cm/sec CCA:  69 cm/sec SYSTOLIC ICA/CCA RATIO:  0.6 ECA:  64 cm/sec Right Brachial SBP: Not acquired Left Brachial SBP: Not acquired  RIGHT CAROTID ARTERY: No significant calcifications of the right common carotid artery. Intermediate waveform maintained. Heterogeneous and partially calcified plaque at the right carotid bifurcation. No significant lumen shadowing. Low resistance waveform of the right ICA. Tortuosity RIGHT VERTEBRAL ARTERY: Antegrade flow with low resistance waveform. LEFT CAROTID ARTERY: No significant calcifications of the left common carotid artery. Intermediate waveform maintained. Heterogeneous and partially calcified plaque at the left carotid bifurcation without significant lumen shadowing. Low resistance waveform of the left ICA. Tortuosity LEFT VERTEBRAL ARTERY:  Antegrade flow with low resistance waveform. IMPRESSION: Color duplex indicates minimal heterogeneous and calcified plaque, with no hemodynamically significant stenosis by duplex criteria in the extracranial cerebrovascular circulation. Signed, Dulcy Fanny. Nadene Rubins, RPVI [facility] Radiology Electronically Signed   By: Corrie Mckusick D.O.   On: 05/14/2022 15:08   US RENAL  Result Date: 05/14/2022 CLINICAL DATA:  Chronic renal disease EXAM: RENAL / URINARY TRACT ULTRASOUND COMPLETE COMPARISON:  Renal ultrasound 05/10/2020 FINDINGS: Right Kidney: Renal measurements: 8.5 x 4.0 x 3.8 cm = volume: 67.2 mL. Renal cortical thinning. Increased cortical echogenicity. No hydronephrosis. Left Kidney: Renal measurements: 9.5 x 4.6 x 4.2 cm = volume: 94.2 mL. Renal cortical thinning. Increased renal cortical echogenicity. No hydronephrosis. Probable 3 mm stone inferior pole. Bladder: Appears normal for degree of bladder distention. Other: None. IMPRESSION: No hydronephrosis. Increased cortical echogenicity compatible with chronic medical renal disease. Electronically Signed   By: Lovey Newcomer M.D.   On: 05/14/2022 15:05   US Guided Needle Placement  Result Date: 04/29/2022 CLINICAL DATA:  Ultrasound was provided for  use by the ordering physician.  No provider Interpretation or professional fees incurred.       IMPRESSION/PLAN: 1. 81 y.o. gentleman with Stage T1c adenocarcinoma of the prostate with Gleason Score of 3+4, and PSA of 11.9. We discussed the patient's workup and outlined the nature of prostate cancer in this setting. The patient's T stage, Gleason's score, and PSA  put him into the favorable intermediate risk group. Accordingly, he is eligible for a variety of potential treatment options including brachytherapy, 5.5 weeks of external radiation, or prostatectomy. We discussed the available radiation techniques, and focused on the details and logistics of delivery. We discussed and outlined the risks, benefits, short and long-term effects associated with radiotherapy and compared and contrasted these with prostatectomy. We discussed the role of SpaceOAR gel in reducing the rectal toxicity associated with radiotherapy. He appears to have a good understanding of his disease and our treatment recommendations which are of curative intent.  He was encouraged to ask questions that were answered to his stated satisfaction.  At the conclusion of our conversation, the patient is interested in moving forward with brachytherapy and use of SpaceOAR gel to reduce rectal toxicity from radiotherapy.  We will share our discussion with Dr. Alyson Hughes and move forward with scheduling his [facility] planning appointment in the near future.  The patient will be contacted by Romie Jumper in our office who will be working closely with him to coordinate OR scheduling and pre and post procedure appointments.  We will contact the pharmaceutical rep to ensure that [place] is available at the time of procedure.  We enjoyed meeting him today and look forward to continuing to participate in his care.  Given current concerns for patient exposure during the COVID-19 pandemic, this encounter was conducted via MyChart video meeting to allow for  face to face communication.  The patient has given verbal consent for this type of encounter. The attendants for this meeting include Tyler Pita MD, and [employer], patient, and [name]. During the encounter, Tyler Pita MD, and Andi Devon, were located at [facility] Radiation Oncology Department.  Patient, Ronson Hagins was located at home  We personally spent 60 minutes in this encounter including chart review, reviewing radiological studies, meeting face-to-face with the patient, entering orders and completing documentation.    Nicholos Johns, PA-C    Tyler Pita, MD  [facility] Direct Dial: [phone]  Fax: [phone] [email or web address]  Skype  [email or web address]

## 2022-05-29 ENCOUNTER — Telehealth: Payer: Self-pay | Admitting: *Deleted

## 2022-05-29 DIAGNOSIS — Z191 Hormone sensitive malignancy status: Secondary | ICD-10-CM | POA: Diagnosis not present

## 2022-05-29 DIAGNOSIS — C61 Malignant neoplasm of prostate: Secondary | ICD-10-CM | POA: Diagnosis not present

## 2022-05-29 NOTE — Telephone Encounter (Signed)
Called patient to ask question, lvm for a return call 

## 2022-05-29 NOTE — Progress Notes (Signed)
Called to introduce myself to the patient as the prostate nurse navigator.  Left voicemail with direct contact number for call back.  He has a virtual visit with Dr. Tammi Klippel on 7/6 to discuss radiation treatment options, and decided to proceed with brachytherapy.

## 2022-06-03 ENCOUNTER — Telehealth: Payer: Self-pay

## 2022-06-03 NOTE — Telephone Encounter (Signed)
I spoke with Sergio Hughes. We have discussed possible surgery dates and 09/03/2022 was agreed upon by all parties. Patient given information about surgery date, what to expect pre-operatively and post operatively.    We discussed that a pre-op nurse will be calling to set up the pre-op visit that will take place prior to surgery. Informed patient that our office will communicate any additional care to be provided after surgery.    Patients questions or concerns were discussed during our call. Advised to call our office should there be any additional information, questions or concerns that arise. Patient verbalized understanding.

## 2022-06-04 ENCOUNTER — Telehealth: Payer: Self-pay

## 2022-06-04 NOTE — Telephone Encounter (Signed)
Patient called and left a vm advising that he has questions regarding surgery and a date and time.     Thank you

## 2022-06-04 NOTE — Telephone Encounter (Signed)
Please call patient ASAP

## 2022-06-05 ENCOUNTER — Telehealth: Payer: Self-pay | Admitting: *Deleted

## 2022-06-05 NOTE — Progress Notes (Signed)
Spoke with Sergio Hughes  lab appointment scheduled 07-23-2022 245 pm.

## 2022-06-05 NOTE — Telephone Encounter (Signed)
Called patient to inform of pre-seed appts. for 07-23-22 and his implant for 09-03-22, spoke with patient and he is aware of these appts.

## 2022-06-08 NOTE — Telephone Encounter (Signed)
Reviewed surgery time with patient and location. Voiced understanding.

## 2022-06-12 ENCOUNTER — Ambulatory Visit (INDEPENDENT_AMBULATORY_CARE_PROVIDER_SITE_OTHER): Payer: Medicare HMO | Admitting: Urology

## 2022-06-12 ENCOUNTER — Encounter: Payer: Self-pay | Admitting: Urology

## 2022-06-12 VITALS — BP 117/70 | HR 81 | Ht 65.0 in | Wt 140.0 lb

## 2022-06-12 DIAGNOSIS — R972 Elevated prostate specific antigen [PSA]: Secondary | ICD-10-CM | POA: Diagnosis not present

## 2022-06-12 DIAGNOSIS — C61 Malignant neoplasm of prostate: Secondary | ICD-10-CM

## 2022-06-12 LAB — URINALYSIS, ROUTINE W REFLEX MICROSCOPIC
Bilirubin, UA: NEGATIVE
Glucose, UA: NEGATIVE
Ketones, UA: NEGATIVE
Leukocytes,UA: NEGATIVE
Nitrite, UA: NEGATIVE
Specific Gravity, UA: 1.01 (ref 1.005–1.030)
Urobilinogen, Ur: 0.2 mg/dL (ref 0.2–1.0)
pH, UA: 5 (ref 5.0–7.5)

## 2022-06-12 LAB — MICROSCOPIC EXAMINATION
Bacteria, UA: NONE SEEN
Epithelial Cells (non renal): NONE SEEN /hpf (ref 0–10)
Renal Epithel, UA: NONE SEEN /hpf
WBC, UA: NONE SEEN /hpf (ref 0–5)

## 2022-06-12 MED ORDER — SILODOSIN 8 MG PO CAPS: 8.0000 mg | ORAL_CAPSULE | Freq: Every day | ORAL | 11 refills | Status: DC

## 2022-06-12 NOTE — Progress Notes (Addendum)
06/12/2022 12:33 PM   Sergio Hughes August 03, 1941 [id number]  Referring provider: Cassandria Anger, MD Sergio Hughes,  Sergio Hughes [id number]  Followup prostate cancer  HPI: Sergio Hughes is a 81yo here for followup for prostate cancer. He met with Dr.Manning and has elected to proceed with brachytherapy. He has moderate LUTS. He previously failed uroxatral. No other complaints today.    PMH: Past Medical History:  Diagnosis Date   Coronary artery calcification seen on CT scan    Hyperlipidemia    Hypertension    Osteoarthritis     Surgical History: Past Surgical History:  Procedure Laterality Date   COLON SURGERY     COLONOSCOPY     INGUINAL HERNIA REPAIR  2005   Right   LESION REMOVAL Left 09/23/2020   Procedure: MINOR EXICISION OF LESION OF LEFT EAR;  Surgeon: Izora Gala, MD;  Location: [facility];  Service: ENT;  Laterality: Left;   PARTIAL COLECTOMY Right 12/16/2015   Procedure: PARTIAL CECECTOMY AND APPENDECTOMY;  Surgeon: Judeth Horn, MD;  Location: [facility];  Service: General;  Laterality: Right;   POLYPECTOMY     VASECTOMY      Home Medications:  Allergies as of 06/12/2022       Reactions   Bupropion Hcl    REACTION: extreme dizziness   Lovastatin    myalgias   Uroxatral [alfuzosin]    Weakness, tinnitus        Medication List        Accurate as of June 12, 2022 12:33 PM. If you have any questions, ask your nurse or doctor.          amLODipine 2.5 MG tablet Commonly known as: NORVASC TAKE 1 TABLET EVERY DAY (KEEP DECEMBER APPOINTMENT FOR FUTURE REFILLS)   aspirin EC 81 MG tablet Take 81 mg by mouth daily.   famotidine 40 MG tablet Commonly known as: Pepcid Take 1 tablet (40 mg total) by mouth daily.   Fish Oil 1000 MG Caps   Glucosamine 1500 Complex Caps Take by mouth.   loratadine 10 MG tablet Commonly known as: CLARITIN Take 1 tablet (10 mg total) by mouth daily.   pravastatin 20 MG tablet Commonly known as:  PRAVACHOL TAKE 1 TABLET EVERY DAY   Vitamin D 50 MCG (2000 UT) Caps Take 1 capsule by mouth daily.        Allergies:  Allergies  Allergen Reactions   Bupropion Hcl     REACTION: extreme dizziness   Lovastatin     myalgias   Uroxatral [Alfuzosin]     Weakness, tinnitus    Family History: Family History  Problem Relation Age of Onset   Hypertension Father    Cancer Father        brain ca   Prostate cancer Brother 14   Colon cancer Neg Hx    Rectal cancer Neg Hx    Stomach cancer Neg Hx    Esophageal cancer Neg Hx     Social History:  reports that he quit smoking about 56 years ago. His smoking use included cigarettes. He has never used smokeless tobacco. He reports current alcohol use of about 21.0 standard drinks of alcohol per week. He reports that he does not use drugs.  ROS: All other review of systems were reviewed and are negative except what is noted above in HPI  Physical Exam: BP 117/70   Pulse 81   Ht _0  (1.651 m)   Wt 140 lb (  63.5 kg)   BMI 23.30 kg/m   Constitutional:  Alert and oriented, No acute distress. HEENT: [place] AT, moist mucus membranes.  Trachea midline, no masses. Cardiovascular: No clubbing, cyanosis, or edema. Respiratory: Normal respiratory effort, no increased work of breathing. GI: Abdomen is soft, nontender, nondistended, no abdominal masses GU: No CVA tenderness.  Lymph: No cervical or inguinal lymphadenopathy. Skin: No rashes, bruises or suspicious lesions. Neurologic: Grossly intact, no focal deficits, moving all 4 extremities. Psychiatric: Normal mood and affect.  Laboratory Data: Lab Results  Component Value Date   WBC 11.5 (H) 10/23/2021   HGB 13.9 10/23/2021   HCT 42.5 10/23/2021   MCV 91.4 10/23/2021   PLT 201.0 Repeated and verified X2. 10/23/2021    Lab Results  Component Value Date   CREATININE 2.29 (H) 10/23/2021    Lab Results  Component Value Date   PSA 11.03 (H) 10/23/2021   PSA 6.35 (H) 09/29/2017    PSA 6.51 (H) 05/24/2017    No results found for: "TESTOSTERONE"  Lab Results  Component Value Date   HGBA1C 5.8 (H) 12/09/2015    Urinalysis    Component Value Date/Time   COLORURINE YELLOW 10/23/2021 1016   APPEARANCEUR Clear 04/08/2022 0935   LABSPEC 1.020 10/23/2021 1016   PHURINE 5.5 10/23/2021 1016   GLUCOSEU Negative 04/08/2022 0935   GLUCOSEU NEGATIVE 10/23/2021 1016   HGBUR NEGATIVE 10/23/2021 1016   BILIRUBINUR Negative 04/08/2022 0935   KETONESUR NEGATIVE 10/23/2021 1016   PROTEINUR Trace (A) 04/08/2022 0935   UROBILINOGEN 0.2 10/23/2021 1016   NITRITE Negative 04/08/2022 0935   NITRITE NEGATIVE 10/23/2021 1016   LEUKOCYTESUR Negative 04/08/2022 0935   LEUKOCYTESUR NEGATIVE 10/23/2021 1016    Lab Results  Component Value Date   LABMICR Comment 04/08/2022   WBCUA 0-5 03/09/2022   LABEPIT None seen 03/09/2022   BACTERIA None seen 03/09/2022    Pertinent Imaging:  No results found for this or any previous visit.  No results found for this or any previous visit.  No results found for this or any previous visit.  No results found for this or any previous visit.  Results for orders placed during the hospital encounter of 05/14/22  US RENAL  Narrative CLINICAL DATA:  Chronic renal disease  EXAM: RENAL / URINARY TRACT ULTRASOUND COMPLETE  COMPARISON:  Renal ultrasound 05/10/2020  FINDINGS: Right Kidney:  Renal measurements: 8.5 x 4.0 x 3.8 cm = volume: 67.2 mL. Renal cortical thinning. Increased cortical echogenicity. No hydronephrosis.  Left Kidney:  Renal measurements: 9.5 x 4.6 x 4.2 cm = volume: 94.2 mL. Renal cortical thinning. Increased renal cortical echogenicity. No hydronephrosis. Probable 3 mm stone inferior pole.  Bladder:  Appears normal for degree of bladder distention.  Other:  None.  IMPRESSION: No hydronephrosis.  Increased cortical echogenicity compatible with chronic medical renal disease.   Electronically  Signed By: Lovey Newcomer M.D. On: 05/14/2022 15:05  No results found for this or any previous visit.  No results found for this or any previous visit.  No results found for this or any previous visit.   Assessment & Plan:    1. Prostate cancer I discussed the natural history of ntermediate risk prostate cancer with the patient and the various treatment options including active surveillance, RALP, IMRT, brachytherapy, cryotherapy, HIFU and ADT. After discussing the options the patient elects for brachytherapy with SpaceOAR  - Urinalysis, Routine w reflex microscopic   No follow-ups on file.  Nicolette Bang, MD  [facility] [place]

## 2022-06-12 NOTE — Patient Instructions (Signed)

## 2022-06-12 NOTE — Addendum Note (Signed)
Addended by: Cleon Gustin on: 06/12/2022 12:37 PM   Modules accepted: Orders

## 2022-06-12 NOTE — Progress Notes (Signed)
Called patient and introduced myself to the patient as the prostate nurse navigator.  No barriers to care identified at this time.  I reviewed upcoming treatment appointments with him and provided education.   I gave him my contact information and asked him to call me with questions or concerns.  Verbalized understanding.

## 2022-06-18 ENCOUNTER — Other Ambulatory Visit: Payer: Self-pay

## 2022-06-18 MED ORDER — SILODOSIN 8 MG PO CAPS: 8.0000 mg | ORAL_CAPSULE | Freq: Every day | ORAL | 11 refills | Status: DC

## 2022-07-17 ENCOUNTER — Telehealth: Payer: Self-pay | Admitting: *Deleted

## 2022-07-17 NOTE — Telephone Encounter (Signed)
CALLED PATIENT TO ASK ABOUT COMING LATER ON 07-23-22, PATIENT AGREED TO HAVE PRE-SEED SIM @ 2 PM AND HIS FU WITH ASHLYN @ 2:30 PM ON 07-23-22

## 2022-07-22 ENCOUNTER — Telehealth: Payer: Self-pay | Admitting: *Deleted

## 2022-07-22 NOTE — Telephone Encounter (Signed)
CALLED PATIENT TO REMIND OF PRE-SEED APPTS. FOR 07-23-22, SPOKE WITH PATIENT AND HE IS AWARE OF THESE APPTS.

## 2022-07-23 ENCOUNTER — Ambulatory Visit: Payer: Medicare HMO | Admitting: Radiation Oncology

## 2022-07-23 ENCOUNTER — Encounter (HOSPITAL_COMMUNITY)
Admission: RE | Admit: 2022-07-23 | Discharge: 2022-07-23 | Disposition: A | Discharge status: Home / Self Care | Payer: Medicare HMO | Source: Ambulatory Visit | Attending: Urology | Admitting: Urology

## 2022-07-23 ENCOUNTER — Ambulatory Visit
Admission: RE | Admit: 2022-07-23 | Discharge: 2022-07-23 | Disposition: A | Discharge status: Home / Self Care | Payer: Medicare HMO | Source: Ambulatory Visit | Attending: Urology | Admitting: Urology

## 2022-07-23 ENCOUNTER — Ambulatory Visit
Admission: RE | Admit: 2022-07-23 | Discharge: 2022-07-23 | Disposition: A | Discharge status: Home / Self Care | Payer: Medicare HMO | Source: Ambulatory Visit | Attending: Radiation Oncology | Admitting: Radiation Oncology

## 2022-07-23 ENCOUNTER — Encounter: Payer: Self-pay | Admitting: Urology

## 2022-07-23 ENCOUNTER — Other Ambulatory Visit: Payer: Self-pay

## 2022-07-23 VITALS — Resp 18 | Ht 65.0 in | Wt 141.0 lb

## 2022-07-23 DIAGNOSIS — I4510 Unspecified right bundle-branch block: Secondary | ICD-10-CM | POA: Insufficient documentation

## 2022-07-23 DIAGNOSIS — C61 Malignant neoplasm of prostate: Secondary | ICD-10-CM

## 2022-07-23 DIAGNOSIS — Z191 Hormone sensitive malignancy status: Secondary | ICD-10-CM | POA: Diagnosis not present

## 2022-07-23 DIAGNOSIS — Z01810 Encounter for preprocedural cardiovascular examination: Secondary | ICD-10-CM | POA: Insufficient documentation

## 2022-07-23 DIAGNOSIS — Z01818 Encounter for other preprocedural examination: Secondary | ICD-10-CM

## 2022-07-23 NOTE — Progress Notes (Signed)
  Radiation Oncology         [phone] ________________________________  Name: Sergio Hughes MRN: [id number]  Date: 07/23/2022  DOB: 05/21/1941  SIMULATION AND TREATMENT PLANNING NOTE PUBIC ARCH STUDY  PJ:[id number], [name], MD  Alyson Ingles Candee Furbish, MD  DIAGNOSIS: 81 y.o. gentleman with Stage T1c adenocarcinoma of the prostate with Gleason score of 3+4, and PSA of 11.9.  Oncology History  Malignant neoplasm of prostate ([place])  04/29/2022 Cancer Staging   Staging form: Prostate, AJCC 8th Edition - Clinical stage from 04/29/2022: Stage IIB (cT1c, cN0, cM0, PSA: 11.9, Grade Group: 2) - Signed by Freeman Caldron, PA-C on 05/28/2022 Histopathologic type: Adenocarcinoma, NOS Prostate specific antigen (PSA) range: 10 to 19 Gleason primary pattern: 3 Gleason secondary pattern: 4 Gleason score: 7 Histologic grading system: 5 grade system Number of biopsy cores examined: 12 Number of biopsy cores positive: 1 Location of positive needle core biopsies: One side   05/28/2022 Initial Diagnosis   Malignant neoplasm of prostate ([place])       ICD-10-CM   1. Malignant neoplasm of prostate ([place])  C61       COMPLEX SIMULATION:  The patient presented today for evaluation for possible prostate seed implant. He was brought to the radiation planning suite and placed supine on the CT couch. A 3-dimensional image study set was obtained in upload to the planning computer. There, on each axial slice, I contoured the prostate gland. Then, using three-dimensional radiation planning tools I reconstructed the prostate in view of the structures from the transperineal needle pathway to assess for possible pubic arch interference. In doing so, I did not appreciate any pubic arch interference. Also, the patient's prostate volume was estimated based on the drawn structure. The volume was 28 cc.  Given the pubic arch appearance and prostate volume, patient remains a good candidate to proceed with prostate seed implant.  Today, he freely provided informed written consent to proceed.    PLAN: The patient will undergo prostate seed implant.   ________________________________  Sheral Apley. Tammi Klippel, M.D.

## 2022-07-23 NOTE — Progress Notes (Signed)
Radiation Oncology         [phone] ________________________________  Outpatient Follow up- Pre-seed visit  Name: Sergio Hughes MRN: [id number]  Date: 07/23/2022  DOB: 01/08/1941  OL:[id number], Sergio Lacks, MD  Sergio Hughes, Sergio Furbish, MD   REFERRING PHYSICIAN: Cleon Gustin, MD  DIAGNOSIS: 81 y.o. gentleman with Stage T1c adenocarcinoma of the prostate with Gleason score of 3+4, and PSA of 11.9.    ICD-10-CM   1. Malignant neoplasm of prostate ([place])  C61       HISTORY OF PRESENT ILLNESS: Sergio Hughes is a 81 y.o. male with a diagnosis of prostate cancer. He is an established urology patient, previously followed by Dr. Alyson Hughes for elevated, fluctuating PSA ranging between 5-6 since at least 2017 but DRE's remained normal so he did not have any prostate biopsies.  More recently, he was noted to have an elevated PSA of 11.03 on 10/23/2021 by his primary care physician, Dr. Alain Hughes.  Accordingly, he was referred back to urology for evaluation by Dr. Alyson Hughes on 03/09/2022,  digital rectal examination was performed at that time revealing no nodularity or concerning findings.  A repeat PSA that day was further elevated at 11.9 and remained elevated at 11.9 when repeated again on 04/01/2022.  The patient proceeded to transrectal ultrasound with 12 biopsies of the prostate on 04/29/2022.  The prostate volume measured 27.6 cc.  Out of 12 core biopsies, 1 was positive.  The maximum Gleason score was 3+4, and this was seen in the right base lateral involving 70% of the sample.   A PSMA PET scan was performed on 05/21/2022 for disease staging and did not show any evidence of metastatic disease.  The patient reviewed the biopsy results with his urologist and was kindly referred to [place] for discussion of potential radiation treatment options. We initially met the patient on 05/28/22 and he was most interested in proceeding with brachytherapy and SpaceOAR gel placement for treatment of his disease. He is here  today for his pre-procedure imaging for planning and to answer any additional questions he may have about this treatment.   PREVIOUS RADIATION THERAPY: No  PAST MEDICAL HISTORY:  Past Medical History:  Diagnosis Date   Coronary artery calcification seen on CT scan    Hyperlipidemia    Hypertension    Osteoarthritis       PAST SURGICAL HISTORY: Past Surgical History:  Procedure Laterality Date   COLON SURGERY     COLONOSCOPY     INGUINAL HERNIA REPAIR  2005   Right   LESION REMOVAL Left 09/23/2020   Procedure: MINOR EXICISION OF LESION OF LEFT EAR;  Surgeon: Izora Gala, MD;  Location: [facility];  Service: ENT;  Laterality: Left;   PARTIAL COLECTOMY Right 12/16/2015   Procedure: PARTIAL CECECTOMY AND APPENDECTOMY;  Surgeon: Judeth Horn, MD;  Location: [facility];  Service: General;  Laterality: Right;   POLYPECTOMY     VASECTOMY      FAMILY HISTORY:  Family History  Problem Relation Age of Onset   Hypertension Father    Cancer Father        brain ca   Prostate cancer Brother 46   Colon cancer Neg Hx    Rectal cancer Neg Hx    Stomach cancer Neg Hx    Esophageal cancer Neg Hx     SOCIAL HISTORY:  Social History   Socioeconomic History   Marital status: Married    Spouse name: Sergio Hughes   Number  of children: Not on file   Years of education: college    Highest education level: Not on file  Occupational History   Occupation: retired  Tobacco Use   Smoking status: Former    Types: Cigarettes    Quit date: 12/14/1965    Years since quitting: 56.6   Smokeless tobacco: Never  Vaping Use   Vaping Use: Never used  Substance and Sexual Activity   Alcohol use: Yes    Alcohol/week: 21.0 standard drinks of alcohol    Types: 7 Glasses of wine, 14 Standard drinks or equivalent per week    Comment: 1-2 glasses of wine or beer each night   Drug use: No   Sexual activity: Not Currently  Other Topics Concern   Not on file  Social History Narrative    Regular exercise - YES - golf   Social Determinants of Health   Financial Resource Strain: Low Risk  (12/12/2021)   Overall Financial Resource Strain (CARDIA)    Difficulty of Paying Living Expenses: Not hard at all  Food Insecurity: No Food Insecurity (12/12/2021)   Hunger Vital Sign    Worried About Running Out of Food in the Last Year: Never true    Ran Out of Food in the Last Year: Never true  Transportation Needs: No Transportation Needs (12/12/2021)   PRAPARE - Hydrologist (Medical): No    Lack of Transportation (Non-Medical): No  Physical Activity: Insufficiently Active (12/12/2021)   Exercise Vital Sign    Days of Exercise per Week: 3 days    Minutes of Exercise per Session: 30 min  Stress: No Stress Concern Present (12/12/2021)   [place]    Feeling of Stress : Not at all  Social Connections: Moderately Integrated (12/12/2021)   Social Connection and Isolation Panel [NHANES]    Frequency of Communication with Friends and Family: Three times a week    Frequency of Social Gatherings with Friends and Family: Three times a week    Attends Religious Services: Never    Active Member of Clubs or Organizations: Yes    Attends Archivist Meetings: More than 4 times per year    Marital Status: Married  Human resources officer Violence: Not At Risk (12/12/2021)   Humiliation, Afraid, Rape, and Kick questionnaire    Fear of Current or Ex-Partner: No    Emotionally Abused: No    Physically Abused: No    Sexually Abused: No    ALLERGIES: Bupropion hcl, Lovastatin, and Uroxatral [alfuzosin]  MEDICATIONS:  Current Outpatient Medications  Medication Sig Dispense Refill   amLODipine (NORVASC) 2.5 MG tablet TAKE 1 TABLET EVERY DAY (KEEP DECEMBER APPOINTMENT FOR FUTURE REFILLS) 90 tablet 0   aspirin 81 MG EC tablet Take 81 mg by mouth daily.     Cholecalciferol (VITAMIN D) 2000 UNITS CAPS  Take 1 capsule by mouth daily.     famotidine (PEPCID) 40 MG tablet Take 1 tablet (40 mg total) by mouth daily. 30 tablet 3   Glucosamine-Chondroit-Vit C-Mn (GLUCOSAMINE 1500 COMPLEX) CAPS Take by mouth.     loratadine (CLARITIN) 10 MG tablet Take 1 tablet (10 mg total) by mouth daily. 30 tablet 3   Omega-3 Fatty Acids (FISH OIL) 1000 MG CAPS      pravastatin (PRAVACHOL) 20 MG tablet TAKE 1 TABLET EVERY DAY 90 tablet 3   silodosin (RAPAFLO) 8 MG CAPS capsule Take 1 capsule (8 mg total) by mouth at  bedtime. 30 capsule 11   No current facility-administered medications for this encounter.    REVIEW OF SYSTEMS:  On review of systems, the patient reports that he is doing well overall. He denies any chest pain, shortness of breath, cough, fevers, chills, night sweats, unintended weight changes. He denies any bowel disturbances, and denies abdominal pain, nausea or vomiting. He denies any new musculoskeletal or joint aches or pains. His IPSS was 15, indicating moderate urinary symptoms with nocturia x3, weak stream, intermittency, urgency and frequency. He has tried Uroxatral but could not tolerate due to weakness and dizziness. A complete review of systems is obtained and is otherwise negative.    PHYSICAL EXAM:  Wt Readings from Last 3 Encounters:  07/23/22 141 lb (64 kg)  06/12/22 140 lb (63.5 kg)  05/28/22 140 lb (63.5 kg)   Temp Readings from Last 3 Encounters:  04/29/22 97.8 F (36.6 C) (Oral)  10/23/21 97.7 F (36.5 C) (Oral)  10/21/20 97.8 F (36.6 C)   BP Readings from Last 3 Encounters:  06/12/22 117/70  05/13/22 123/63  04/29/22 (!) 144/69   Pulse Readings from Last 3 Encounters:  06/12/22 81  05/13/22 63  04/29/22 68   Pain Assessment Pain Score: 0-No pain/10  In general this is a well appearing Caucasian male in no acute distress. He's alert and oriented x4 and appropriate throughout the examination. Cardiopulmonary assessment is negative for acute distress, and he  exhibits normal effort.     KPS = 100  100 - Normal; no complaints; no evidence of disease. 90   - Able to carry on normal activity; minor signs or symptoms of disease. 80   - Normal activity with effort; some signs or symptoms of disease. 44   - Cares for self; unable to carry on normal activity or to do active work. 60   - Requires occasional assistance, but is able to care for most of his personal needs. 50   - Requires considerable assistance and frequent medical care. 49   - Disabled; requires special care and assistance. 2   - Severely disabled; hospital admission is indicated although death not imminent. 67   - Very sick; hospital admission necessary; active supportive treatment necessary. 10   - Moribund; fatal processes progressing rapidly. 0     - Dead  Karnofsky DA, Abelmann [place], Craver LS and Burchenal [facility] [phone]) The use of the nitrogen mustards in the palliative treatment of carcinoma: with particular reference to bronchogenic carcinoma Cancer 1 634-56  LABORATORY DATA:  Lab Results  Component Value Date   WBC 11.5 (H) 10/23/2021   HGB 13.9 10/23/2021   HCT 42.5 10/23/2021   MCV 91.4 10/23/2021   PLT 201.0 Repeated and verified X2. 10/23/2021   Lab Results  Component Value Date   NA 139 10/23/2021   K 4.5 10/23/2021   CL 105 10/23/2021   CO2 26 10/23/2021   Lab Results  Component Value Date   ALT 15 10/23/2021   AST 22 10/23/2021   ALKPHOS 76 10/23/2021   BILITOT 0.5 10/23/2021     RADIOGRAPHY: No results found.    IMPRESSION/PLAN: 1. 81 y.o. gentleman with Stage T1c adenocarcinoma of the prostate with Gleason score of 3+4, and PSA of 11.9. The patient has elected to proceed with seed implant for treatment of his disease. We reviewed the risks, benefits, short and long-term effects associated with brachytherapy and discussed the role of SpaceOAR in reducing the rectal toxicity associated with radiotherapy.  He appears  to have a good understanding of his  disease and our treatment recommendations which are of curative intent.  He was encouraged to ask questions that were answered to his stated satisfaction. He has freely signed written consent to proceed today in the office and a copy of this document will be placed in his medical record. His procedure is tentatively scheduled for 09/03/22 in collaboration with Dr. Alyson Hughes and we will see him back for his post-procedure visit approximately 3 weeks thereafter. We look forward to continuing to participate in his care. He knows that he is welcome to call with any questions or concerns at any time in the interim.  I personally spent 20 minutes in this encounter including chart review, reviewing radiological studies, meeting face-to-face with the patient, entering orders and completing documentation.    Nicholos Johns, MMS, PA-C [facility] [place]: [phone]  Fax: [phone]

## 2022-07-23 NOTE — Progress Notes (Signed)
Pre-seed appointment. I verified patient's identity and began nursing interview. Patient reports moderate polyuria. No other issues reported at this time.  Meaningful use complete. No urinary management medications. Urology appt- Oct 2023  Resp 18   Ht '5\' 5"'$  (1.651 m)   Wt 141 lb (64 kg)   BMI 23.46 kg/m

## 2022-07-28 ENCOUNTER — Other Ambulatory Visit: Payer: Self-pay

## 2022-07-28 ENCOUNTER — Telehealth: Payer: Self-pay

## 2022-07-28 MED ORDER — SILODOSIN 8 MG PO CAPS: 8.0000 mg | ORAL_CAPSULE | Freq: Every day | ORAL | 11 refills | Status: DC

## 2022-07-28 NOTE — Telephone Encounter (Signed)
Patient called this am to ask for a printed rx for silodosin to try for 30 days due to insurance having high co pay. Patient will use good rx card per patient for 1st 30 day prescription.  Rx printed for patient and left at front desk for patient to pickup patient aware

## 2022-08-05 DIAGNOSIS — N184 Chronic kidney disease, stage 4 (severe): Secondary | ICD-10-CM | POA: Diagnosis not present

## 2022-08-10 DIAGNOSIS — N184 Chronic kidney disease, stage 4 (severe): Secondary | ICD-10-CM | POA: Diagnosis not present

## 2022-08-10 DIAGNOSIS — R809 Proteinuria, unspecified: Secondary | ICD-10-CM | POA: Diagnosis not present

## 2022-08-10 DIAGNOSIS — I129 Hypertensive chronic kidney disease with stage 1 through stage 4 chronic kidney disease, or unspecified chronic kidney disease: Secondary | ICD-10-CM | POA: Diagnosis not present

## 2022-08-21 ENCOUNTER — Encounter (HOSPITAL_BASED_OUTPATIENT_CLINIC_OR_DEPARTMENT_OTHER): Payer: Self-pay | Admitting: Urology

## 2022-08-25 ENCOUNTER — Other Ambulatory Visit: Payer: Self-pay

## 2022-08-25 ENCOUNTER — Encounter (HOSPITAL_BASED_OUTPATIENT_CLINIC_OR_DEPARTMENT_OTHER): Payer: Self-pay | Admitting: Urology

## 2022-08-25 NOTE — Progress Notes (Signed)
Spoke w/ via phone for pre-op interview---pt Lab needs dos----  I stat             Lab results------ COVID test -----patient states asymptomatic no test needed Arrive at -------1130 am NPO after MN NO Solid Food.  Clear liquids from MN until---1030 am Med rec completed Medications to take morning of surgery -----pravastatin Diabetic medication -----n/a Patient instructed no nail polish to be worn day of surgery Patient instructed to bring photo id and insurance card day of surgery Patient aware to have Driver (ride ) / caregiver  daughter in law gina [place] Deck will stay   for 24 hours after surgery  Patient Special Instructions -----fleets enema am Pre-Op special Istructions -----stay on 81 mg aspirin per dr Alyson Ingles Patient verbalized understanding of instructions that were given at this phone interview. Patient denies shortness of breath, chest pain, fever, cough at this phone interview.   Anesthesia Review:htn (off Amlodipine last 3 to 4 months due to low bp),bilateral carotid bruit, coronary artery calcification seen on ct scan, ckd stage 4 , prostate cancer  Reviewed patient history with dr c Glennon Mac mda , per dr c Glennon Mac mda pt needs cardiac clearance with dr Domenic Polite and to see if patient needs updated stress test prior to 09-03-2022 surgery. Called Gibson Ramp at dr Alyson Ingles office and left message pt needs cardiac clearance and to see if needs updated stress test prior to 09-03-2022 surgery  PCP: dr Trixie Rude Cardiologist :dr Ree Edman  09-13-2020 epic Chest x-ray : EKG :07-23-2022 chart/epic Echo :none Stress test:09-07-2019 epic Cardiac Cath : none Activity level: active, primary caregiver for wife Sleep Study/ CPAP :none ASA / Instructions/ Last Dose :  stay on 81 mg aspirin per dr Alyson Ingles instructions

## 2022-08-26 ENCOUNTER — Telehealth: Payer: Self-pay

## 2022-08-26 NOTE — Telephone Encounter (Signed)
Patient called see it would be ok to have his clovid/flu vaccine would not interfere with his upcoming procedure on 10/18.  Returned his call and informed him that per Dr. Alyson Ingles the vaccine will not interfere with his procedure.  Patient voiced understanding.

## 2022-08-27 ENCOUNTER — Telehealth: Payer: Self-pay | Admitting: Cardiology

## 2022-08-27 NOTE — Telephone Encounter (Signed)
   Pre-operative Risk Assessment    Patient Name: Sergio Hughes  DOB: [birth year] MRN: [id number]      Request for Surgical Clearance    Procedure:   Radioactive Seed Implant/Brachytherapy Implant  Date of Surgery:  Clearance 09/03/22                                 Surgeon:  Dr. Nicolette Bang Surgeon's Group or Practice Name:  [facility] [place] Phone number:  [phone] Fax number:  [phone]   Type of Clearance Requested:   - Medical    Type of Anesthesia:  General    Additional requests/questions:    SignedHipolito Bayley   08/27/2022, 8:24 AM

## 2022-08-27 NOTE — Telephone Encounter (Signed)
Pt has appt 08/28/22 with Melina Copa, PAC 2:45.

## 2022-08-27 NOTE — Telephone Encounter (Signed)
Message has been sent to [facility] office to see if they can help schedule a in office appt for pre op clearance.  Procedure:   Radioactive Seed Implant/Brachytherapy Implant   Date of Surgery:  Clearance 09/03/22                                 Surgeon:  Dr. Nicolette Bang

## 2022-08-27 NOTE — Telephone Encounter (Signed)
    Primary Cardiologist:Samuel Domenic Polite, MD  Chart reviewed as part of pre-operative protocol coverage. Because of Sergio Hughes's past medical history and time since last visit, he/she will require a follow-up visit in order to better assess preoperative cardiovascular risk.  Pre-op covering staff: - Please schedule appointment and call patient to inform them. - Please contact requesting surgeon's office via preferred method (i.e, phone, fax) to inform them of need for appointment prior to surgery.  If applicable, this message will also be routed to pharmacy pool and/or primary cardiologist for input on holding anticoagulant/antiplatelet agent as requested below so that this information is available at time of patient's appointment.   Deberah Pelton, NP  08/27/2022, 10:52 AM

## 2022-08-28 ENCOUNTER — Telehealth: Payer: Self-pay | Admitting: Physician Assistant

## 2022-08-28 ENCOUNTER — Ambulatory Visit: Payer: Medicare HMO | Attending: Physician Assistant | Admitting: Physician Assistant

## 2022-08-28 ENCOUNTER — Encounter: Payer: Self-pay | Admitting: Physician Assistant

## 2022-08-28 VITALS — BP 112/58 | HR 65 | Ht 65.0 in | Wt 141.0 lb

## 2022-08-28 DIAGNOSIS — I4510 Unspecified right bundle-branch block: Secondary | ICD-10-CM

## 2022-08-28 DIAGNOSIS — E785 Hyperlipidemia, unspecified: Secondary | ICD-10-CM | POA: Diagnosis not present

## 2022-08-28 DIAGNOSIS — I2584 Coronary atherosclerosis due to calcified coronary lesion: Secondary | ICD-10-CM

## 2022-08-28 DIAGNOSIS — Z01810 Encounter for preprocedural cardiovascular examination: Secondary | ICD-10-CM

## 2022-08-28 DIAGNOSIS — I10 Essential (primary) hypertension: Secondary | ICD-10-CM

## 2022-08-28 DIAGNOSIS — D649 Anemia, unspecified: Secondary | ICD-10-CM

## 2022-08-28 DIAGNOSIS — I2510 Atherosclerotic heart disease of native coronary artery without angina pectoris: Secondary | ICD-10-CM

## 2022-08-28 LAB — CBC
Hematocrit: 31.9 % — ABNORMAL LOW (ref 37.5–51.0)
Hemoglobin: 10.7 g/dL — ABNORMAL LOW (ref 13.0–17.7)
MCH: 30.1 pg (ref 26.6–33.0)
MCHC: 33.5 g/dL (ref 31.5–35.7)
MCV: 90 fL (ref 79–97)
Platelets: 166 10*3/uL (ref 150–450)
RBC: 3.56 x10E6/uL — ABNORMAL LOW (ref 4.14–5.80)
RDW: 13.1 % (ref 11.6–15.4)
WBC: 6.1 10*3/uL (ref 3.4–10.8)

## 2022-08-28 NOTE — Telephone Encounter (Signed)
Notes have been faxed to correct office today

## 2022-08-28 NOTE — Patient Instructions (Signed)
Medication Instructions:  Your physician recommends that you continue on your current medications as directed. Please refer to the Current Medication list given to you today.  *If you need a refill on your cardiac medications before your next appointment, please call your pharmacy*   Lab Work: CBC If you have labs (blood work) drawn today and your tests are completely normal, you will receive your results only by: [place] (if you have MyChart) OR A paper copy in the mail If you have any lab test that is abnormal or we need to change your treatment, we will call you to review the results.   Testing/Procedures: Your physician has requested that you have an echocardiogram. Echocardiography is a painless test that uses sound waves to create images of your heart. It provides your doctor with information about the size and shape of your heart and how well your heart's chambers and valves are working. This procedure takes approximately one hour. There are no restrictions for this procedure.    Follow-Up: At [facility], you and your health needs are our priority.  As part of our continuing mission to provide you with exceptional heart care, we have created designated Provider Care Teams.  These Care Teams include your primary Cardiologist (physician) and Advanced Practice Providers (APPs -  Physician Assistants and Nurse Practitioners) who all work together to provide you with the care you need, when you need it.   Your next appointment:   1 year(s)  The format for your next appointment:   In Person  Provider:   You may see Rozann Lesches, MD or Melina Copa  Important Information About Sugar

## 2022-08-28 NOTE — Telephone Encounter (Addendum)
At today's visit I ordered a repeat CBC given the downtrend in Hgb noted from prior labs. His baseline was previously 14-15. In 10/2021 he was downtrending to 13.9. KPN 07/2022 showed Hgb of 11.3. Repeat today is 10.7. He reports several weeks of hematuria around the time of his biopsy but that has since cleared up. He is not having any symptoms of GI bleeding. He's otherwise been feeling well. It was also noted at today's visit that his BP was normal despite no longer requiring any blood pressure medicines. I would favor stopping aspirin until his anemia can be evaluated and deemed safe to restart. This could be due in part to his CKD and hematuria but the downtrend concerns me. I have called the patient and let him know to hold his aspirin. I will route to the patient's PCP Dr. Alain Marion as well as Dr. Arty Baumgartner and Dr. Noah Delaine for input. I will also addend today's office note to reflect the anemia - I am not sure if this will impact the plans for his surgery from a medical standpoint. Cardiac wise I have no specific concerns- just planning on getting a non-urgent echo for his baseline RBBB that did not need to be done prior to surgery. Otherwise he was doing well from a cardiac standpoint and won't require follow-up for a year.

## 2022-08-28 NOTE — Telephone Encounter (Signed)
Patient seen for preop clearance today in our office ([facility]). He was cleared for his surgery which is scheduled 09/03/22. It was not outlined on the clearance whether or not he had to hold his aspirin. From our standpoint, would be OK to hold if needed but otherwise continue if he does not need to hold. I did fax a copy of my note to urology but given that surgery is just next week, I asked our clinic nurse to reach out to clarify with urology office whether they felt patient needed to hold this. Bevely Palmer called [facility] who transferred her to the extension where she left a message for them to call back to clarify.

## 2022-08-28 NOTE — Progress Notes (Addendum)
Cardiology Office Note    Date:  08/28/2022   ID:  Sergio Hughes, Sergio Hughes Oct 24, 1941, MRN [id number]  PCP:  Sergio Anger, MD  Cardiologist:  Sergio Lesches, MD  Electrophysiologist:  None   Chief Complaint: preop evaluation  History of Present Illness:   Sergio Hughes is a 81 y.o. male with history of coronary calcification seen on CT, HLD, HTN, CKD IV by labs, RBBB, minimal carotid plaque by duplex 04/2022, prostate CA who is seen for pre-op eval. He had prior calcium score in 05/2019 with CAC 1709, 83%ile. This was followed up by a nuclears tress test that showed a small, mild intensity, apical anteroseptal defect that is partially reversible and suggestive of a mild ischemic territory, EF 69%. This was managed medically since he had not had any anginal symptoms.  He is pending radioactive seed implant/brachytherapy implant and cardiac clearance is requested. He was recently diagnosed with prostate cancer. He is also now seeing Dr. Arty Hughes for his progressive CKD - last [place] labs reviewed below. He's been doing a lot of reading about renal diet online. He otherwise reports he's been doing great from a cardiac standpoint without any recent CP, SOB, orthopnea, edema, palpitations, or syncope. He reports he will get mild dyspnea with much higher levels of exertion but states this is proportionate to level of activity that declined during Covid.   Labwork independently reviewed: KPN BUN 65, Cr 3.090, Hgb 11.3 10/2021 TSH wnl, Hgb 13.9, plt 201, LDL 83, 4.5, Cr 2.29, AST/ALT wnl   Cardiology Studies:   Studies reviewed are outlined and summarized above. Reports included below if pertinent.   Carotid 04/2022  IMPRESSION: Color duplex indicates minimal heterogeneous and calcified plaque, with no hemodynamically significant stenosis by duplex criteria in the extracranial cerebrovascular circulation.   NST 08/2019 arrative & Impression  No diagnostic ST segment changes to indicate  ischemia. Small, mild intensity, apical anteroseptal defect that is partially reversible and suggestive of a mild ischemic territory. This is a low risk study. Nuclear stress EF: 69%.   Calcium score 05/2019 MPRESSION: Coronary calcium score of 1709. This was 3 rd percentile for age and sex matched control.   Circumflex hard to score due to dense mitral annular calcification Consider f/u perfusion study given   How high score is   Sergio Hughes     Electronically Signed   By: Sergio Hughes M.D.   On: 06/21/2019 15:29  Overread   IMPRESSION: Evidence of benign granulomatous disease.   Electronically Signed: By: Sergio Hughes M.D. On: 06/21/2019 10:01        Past Medical History:  Diagnosis Date   Bilateral carotid bruits    US carotid bilateral 05-14-2022 epic   Chronic kidney disease stage 4    Coronary artery calcification seen on CT scan    Hyperlipidemia    Hypertension    per pt on 08-25-2022 off amlodipine last 3 to 4 months per nephrology dr Sergio Hughes   Osteoarthritis    Prostate cancer [facility])    RBBB     Past Surgical History:  Procedure Laterality Date   COLONOSCOPY     INGUINAL HERNIA REPAIR  2005   Right   LESION REMOVAL Left 09/23/2020   Procedure: MINOR EXICISION OF LESION OF LEFT EAR;  Surgeon: Sergio Gala, MD;  Location: [facility];  Service: ENT;  Laterality: Left;   PARTIAL COLECTOMY Right 12/16/2015   Procedure: PARTIAL CECECTOMY AND APPENDECTOMY;  Surgeon: Sergio Horn, MD;  Location: [facility] OR;  Service: General;  Laterality: Right;   POLYPECTOMY     VASECTOMY      Current Medications: Current Meds  Medication Sig   aspirin 81 MG EC tablet Take 81 mg by mouth daily.   Cholecalciferol (VITAMIN D) 2000 UNITS CAPS Take 1 capsule by mouth daily.   famotidine (PEPCID) 40 MG tablet Take 1 tablet (40 mg total) by mouth daily. (Patient taking differently: Take 40 mg by mouth as needed.)   Glucosamine-Chondroit-Vit C-Mn  (GLUCOSAMINE 1500 COMPLEX) CAPS Take by mouth daily at 2 PM.   loratadine (CLARITIN) 10 MG tablet Take 1 tablet (10 mg total) by mouth daily. (Patient taking differently: Take 10 mg by mouth daily as needed.)   Omega-3 Fatty Acids (FISH OIL) 1000 MG CAPS 1,200 mg daily at 2 PM.   pravastatin (PRAVACHOL) 20 MG tablet TAKE 1 TABLET EVERY DAY   silodosin (RAPAFLO) 8 MG CAPS capsule Take 1 capsule (8 mg total) by mouth at bedtime.     Allergies:   Bupropion hcl, Lovastatin, and Uroxatral [alfuzosin]   Social History   Socioeconomic History   Marital status: Married    Spouse name: Sergio Hughes   Number of children: Not on file   Years of education: college    Highest education level: Not on file  Occupational History   Occupation: retired  Tobacco Use   Smoking status: Former    Packs/day: 0.50    Years: 10.00    Total pack years: 5.00    Types: Cigarettes    Quit date: 12/14/1965    Years since quitting: 56.7   Smokeless tobacco: Never  Vaping Use   Vaping Use: Never used  Substance and Sexual Activity   Alcohol use: Not Currently   Drug use: No   Sexual activity: Not Currently  Other Topics Concern   Not on file  Social History Narrative   Regular exercise - YES - golf   Social Determinants of Health   Financial Resource Strain: Low Risk  (12/12/2021)   Overall Financial Resource Strain (CARDIA)    Difficulty of Paying Living Expenses: Not hard at all  Food Insecurity: No Food Insecurity (12/12/2021)   Hunger Vital Sign    Worried About Running Out of Food in the Last Year: Never true    [place] in the Last Year: Never true  Transportation Needs: No Transportation Needs (12/12/2021)   PRAPARE - Hydrologist (Medical): No    Lack of Transportation (Non-Medical): No  Physical Activity: Insufficiently Active (12/12/2021)   Exercise Vital Sign    Days of Exercise per Week: 3 days    Minutes of Exercise per Session: 30 min  Stress: No  Stress Concern Present (12/12/2021)   [place]    Feeling of Stress : Not at all  Social Connections: Moderately Integrated (12/12/2021)   Social Connection and Isolation Panel [NHANES]    Frequency of Communication with Friends and Family: Three times a week    Frequency of Social Gatherings with Friends and Family: Three times a week    Attends Religious Services: Never    Active Member of Clubs or Organizations: Yes    Attends Music therapist: More than 4 times per year    Marital Status: Married     Family History:  The patient's family history includes Cancer in his father; Hypertension in his father; Prostate cancer (age of  onset: 58) in his brother. There is no history of Colon cancer, Rectal cancer, Stomach cancer, or Esophageal cancer.  ROS:   Please see the history of present illness.  All other systems are reviewed and otherwise negative.    EKG(s)/Additional Labs   EKG:  EKG is ordered today, personally reviewed, demonstrating NSR 65bpm, LAD, RBBB, nonspecific TW changes. No acute change from prior.  Recent Labs: 10/23/2021: ALT 15; BUN 51; Creatinine, Ser 2.29; Hemoglobin 13.9; Platelets 201.0 Repeated and verified X2.; Potassium 4.5; Sodium 139; TSH 0.59  Recent Lipid Panel    Component Value Date/Time   CHOL 181 10/23/2021 1016   TRIG 76.0 10/23/2021 1016   HDL 82.20 10/23/2021 1016   CHOLHDL 2 10/23/2021 1016   VLDL 15.2 10/23/2021 1016   LDLCALC 83 10/23/2021 1016   LDLDIRECT 144.9 11/14/2013 0859    PHYSICAL EXAM:    VS:  BP (!) 112/58   Pulse 65   Ht '5\' 5"'$  (1.651 m)   Wt 141 lb (64 kg)   SpO2 95%   BMI 23.46 kg/m   BMI: Body mass index is 23.46 kg/m.  GEN: Well nourished, well developed male in no acute distress HEENT: normocephalic, atraumatic Neck: no JVD, carotid bruits, or masses Cardiac: RRR; no murmurs, rubs, or gallops, no edema  Respiratory:  clear to  auscultation bilaterally, normal work of breathing GI: soft, nontender, nondistended, + BS MS: no deformity or atrophy Skin: warm and dry, no rash Neuro:  Alert and Oriented x 3, Strength and sensation are intact, follows commands Psych: euthymic mood, full affect  Wt Readings from Last 3 Encounters:  08/28/22 141 lb (64 kg)  07/23/22 141 lb (64 kg)  06/12/22 140 lb (63.5 kg)     ASSESSMENT & PLAN:   1. Preop cardiovascular evaluation - if incorporating his prior coronary diagnosis and stress test into the revised cardiac risk index, he scores 6.6% for moderate CV risk. I discussed his case with Dr. Stanford Breed in Dr. Myles Gip absence, given his known coronary calcification and prior abnormal but low risk stress test. Dr. Stanford Breed felt that if he was not having any unstable coronary symptoms that he could proceed with surgery without any further cardiac testing. To this day Mr. Rottenberg denies any concerns of anginal symptoms and is able complete at least 6.6 METS without angina or dyspnea. Therefore, based on ACC/AHA guidelines, the patient would be at acceptable risk for the planned procedure without further cardiovascular testing. The patient was advised that if he develops new symptoms prior to surgery to contact our office to arrange for a follow-up visit, and he verbalized understanding. We are not asked to hold aspirin for the procedure. Would continue if able, but if he needs to hold this for the surgery, he may do so given no formal hx of PCI that would preclude holding. The patient knows to notify for any new symptoms. I am ordering a baseline echo giving his baseline RBBB but this does not need to be completed prior to his surgery. I will route a copy of this note to requesting surgeon. ADDENDUM 08/28/2022 5:15 PM: At today's visit I ordered a repeat CBC given the downtrend in Hgb noted from prior labs. His baseline was previously 14-15. In 10/2021 he was downtrending to 13.9. KPN 07/2022 showed  Hgb of 11.3. Repeat today is 10.7. He reports several weeks of hematuria around the time of his biopsy but that has since cleared up  (that was earlier this summer). He is  not having any symptoms of GI bleeding. It was also noted at today's visit that his BP was normal despite no longer requiring any blood pressure medicines. I would favor stopping aspirin until his anemia can be evaluated and deemed safe to restart. This could be due in part to his CKD and hematuria but the downtrend concerns me. I have called the patient and let him know to hold his aspirin. I will route to the patient's PCP Dr. Alain Marion as well as Dr. Arty Hughes and Dr. Noah Delaine for input. I will also addend today's office note to reflect the anemia - I am not sure if this will impact the plans for his surgery from a medical standpoint, will defer to surgical team. Will re-fax note.  2. Coronary artery disease on CT, HLD - doing well without angina. Continue ASA (as long as anemia not prohibitive), pravastatin. We discussed changing his statin to higher potency to address a goal LDL of <70 but he is not presently interested in disrupting his medicine regimen which is completely understandable.   3. Essential HTN - listed in hx but BP is normal and not requiring any antihypertensives. No med changes made today.  4. RBBB - chronic by EKGs, no symptoms to suggest bradycardia. I would recommend a baseline echocardiogram given this finding but it does not need to be done prior to surgery.  5. Anemia - last Hgb by [facility] 07/2022 was 11.3. This looks to be a downtrend from prior values. He does not recall anyone mentioning this. I will recheck today in clinic and plan to share results with his PCP and nephrologist. He denies any symptoms of blood loss.     Disposition: F/u with Dr. Domenic Polite in 1 year.   Medication Adjustments/Labs and Tests Ordered: Current medicines are reviewed at length with the patient today.  Concerns regarding  medicines are outlined above. Medication changes, Labs and Tests ordered today are summarized above and listed in the Patient Instructions accessible in Encounters.   Signed, Charlie Pitter, PA-C  08/28/2022 2:54 PM    [place] Phone: [phone]; Fax: [phone]

## 2022-08-28 NOTE — Telephone Encounter (Signed)
Caller returned RN's call.  Caller stated that this patient is not seen in their office.  Caller stated this patient is seen by Dr. Nicolette Bang at [phone].

## 2022-08-28 NOTE — Progress Notes (Signed)
Pt had cardiac office visit 08-28-2022 and was given cardiac clearance for surgery on 09-03-2022 by Melina Copa PA w/ consultation with Dr Stanford Breed on 08-28-2022 with no further testing needed prior to surgery. in epic (printed and place in chart)

## 2022-08-31 NOTE — Telephone Encounter (Signed)
Thank you! For the meantime pre-surgery, will have him hold his aspirin, but what are your thoughts on when OK to restart? Would ideally like to see back him on it with his prior coronary calcification/abnormal stress test when he's safe to do so (want to ensure he has f/u of the anemia) - appreciate your input.

## 2022-08-31 NOTE — Telephone Encounter (Addendum)
Sergio Hughes, Sergio Hughes, Sergio Hughes can have radioactive seed implantation procedure done with his current degree of anemia on his latest blood work.  His anemia is likely related to his renal disease.  He can follow-up with Dr. Marval Regal, or we can have him see a hematologist in the near future. Thanks, AP

## 2022-08-31 NOTE — Telephone Encounter (Signed)
Okay to restart aspirin per your surgical protocol. Thank you, AP

## 2022-09-01 NOTE — Telephone Encounter (Signed)
Butch Penny with [facility] advised Dayna Dunn's note re: the ASA and she will send a note to his Surgeon Dr Alyson Ingles.   I also spoke with the pt and he verbalized understanding and will talk with Dr Alyson Ingles post op to be sure when to restart the ASA and he will make an appt with Dr Alain Marion... he also says he will watch for signs of bleeding once he restarts it.

## 2022-09-01 NOTE — Telephone Encounter (Signed)
Left a message for the pt to call back.    LM for [facility] with [facility]... [phone].

## 2022-09-01 NOTE — Telephone Encounter (Signed)
[facility] is returning call. Transferred to Rockney Ghee, RN.

## 2022-09-01 NOTE — Telephone Encounter (Signed)
Triage - please let pt know that primary care gave him the OK to restart aspirin after his prostate surgery. If he notices any bleeding such as blood in stool, black tarry stools, blood in urine, nosebleeds or any other unusual bleeding, he should reach out to his primary doctor. I would also recommend he schedule a post-surgical visit with primary care to further follow his anemia. Can you also please let the surgical team know that we have held his aspirin pre-operatively but recommend to restart when they feel safe after surgery is complete? Thank you!

## 2022-09-02 ENCOUNTER — Ambulatory Visit: Payer: Medicare HMO | Admitting: Urology

## 2022-09-02 ENCOUNTER — Telehealth: Payer: Self-pay | Admitting: *Deleted

## 2022-09-02 NOTE — Telephone Encounter (Signed)
CALLED PATIENT TO REMIND OF PROCEDURE FOR 09-03-22, SPOKE WITH PATIENT AND HE IS AWARE OF THIS PROCEDURE

## 2022-09-03 ENCOUNTER — Ambulatory Visit (HOSPITAL_COMMUNITY): Payer: Medicare HMO

## 2022-09-03 ENCOUNTER — Ambulatory Visit (HOSPITAL_BASED_OUTPATIENT_CLINIC_OR_DEPARTMENT_OTHER)
Admission: RE | Admit: 2022-09-03 | Discharge: 2022-09-03 | Disposition: A | Discharge status: Home / Self Care | Payer: Medicare HMO | Source: Ambulatory Visit | Attending: Urology | Admitting: Urology

## 2022-09-03 ENCOUNTER — Encounter (HOSPITAL_BASED_OUTPATIENT_CLINIC_OR_DEPARTMENT_OTHER)
Admission: RE | Disposition: A | Discharge status: Home / Self Care | Payer: Self-pay | Source: Ambulatory Visit | Attending: Urology

## 2022-09-03 ENCOUNTER — Ambulatory Visit (HOSPITAL_BASED_OUTPATIENT_CLINIC_OR_DEPARTMENT_OTHER): Payer: Medicare HMO | Admitting: Certified Registered Nurse Anesthetist

## 2022-09-03 ENCOUNTER — Other Ambulatory Visit: Payer: Self-pay

## 2022-09-03 DIAGNOSIS — K219 Gastro-esophageal reflux disease without esophagitis: Secondary | ICD-10-CM | POA: Insufficient documentation

## 2022-09-03 DIAGNOSIS — Z87891 Personal history of nicotine dependence: Secondary | ICD-10-CM

## 2022-09-03 DIAGNOSIS — C61 Malignant neoplasm of prostate: Secondary | ICD-10-CM

## 2022-09-03 DIAGNOSIS — I2510 Atherosclerotic heart disease of native coronary artery without angina pectoris: Secondary | ICD-10-CM | POA: Insufficient documentation

## 2022-09-03 DIAGNOSIS — E785 Hyperlipidemia, unspecified: Secondary | ICD-10-CM | POA: Diagnosis not present

## 2022-09-03 DIAGNOSIS — Z01818 Encounter for other preprocedural examination: Secondary | ICD-10-CM

## 2022-09-03 DIAGNOSIS — N184 Chronic kidney disease, stage 4 (severe): Secondary | ICD-10-CM | POA: Insufficient documentation

## 2022-09-03 DIAGNOSIS — Z191 Hormone sensitive malignancy status: Secondary | ICD-10-CM | POA: Diagnosis not present

## 2022-09-03 DIAGNOSIS — I4510 Unspecified right bundle-branch block: Secondary | ICD-10-CM | POA: Diagnosis not present

## 2022-09-03 DIAGNOSIS — M1990 Unspecified osteoarthritis, unspecified site: Secondary | ICD-10-CM | POA: Diagnosis not present

## 2022-09-03 DIAGNOSIS — I129 Hypertensive chronic kidney disease with stage 1 through stage 4 chronic kidney disease, or unspecified chronic kidney disease: Secondary | ICD-10-CM | POA: Diagnosis not present

## 2022-09-03 DIAGNOSIS — I10 Essential (primary) hypertension: Secondary | ICD-10-CM

## 2022-09-03 DIAGNOSIS — Z79899 Other long term (current) drug therapy: Secondary | ICD-10-CM | POA: Insufficient documentation

## 2022-09-03 HISTORY — PX: CYSTOSCOPY: SHX5120

## 2022-09-03 HISTORY — DX: Malignant neoplasm of prostate: C61

## 2022-09-03 HISTORY — PX: RADIOACTIVE SEED IMPLANT: SHX5150

## 2022-09-03 HISTORY — DX: Other specified symptoms and signs involving the circulatory and respiratory systems: R09.89

## 2022-09-03 HISTORY — PX: SPACE OAR INSTILLATION: SHX6769

## 2022-09-03 HISTORY — DX: Chronic kidney disease, unspecified: N18.9

## 2022-09-03 LAB — POCT I-STAT, CHEM 8
BUN: 73 mg/dL — ABNORMAL HIGH (ref 8–23)
Calcium, Ion: 1.23 mmol/L (ref 1.15–1.40)
Chloride: 107 mmol/L (ref 98–111)
Creatinine, Ser: 3.5 mg/dL — ABNORMAL HIGH (ref 0.61–1.24)
Glucose, Bld: 88 mg/dL (ref 70–99)
HCT: 35 % — ABNORMAL LOW (ref 39.0–52.0)
Hemoglobin: 11.9 g/dL — ABNORMAL LOW (ref 13.0–17.0)
Potassium: 3.9 mmol/L (ref 3.5–5.1)
Sodium: 143 mmol/L (ref 135–145)
TCO2: 24 mmol/L (ref 22–32)

## 2022-09-03 SURGERY — INSERTION, RADIATION SOURCE, PROSTATE
Anesthesia: General | Site: Prostate

## 2022-09-03 MED ORDER — ROCURONIUM BROMIDE 10 MG/ML (PF) SYRINGE
PREFILLED_SYRINGE | INTRAVENOUS | Status: AC
  Filled 2022-09-03: qty 10

## 2022-09-03 MED ORDER — STERILE WATER FOR IRRIGATION IR SOLN
Status: DC | PRN
  Administered 2022-09-03: 7 mL

## 2022-09-03 MED ORDER — TRAMADOL HCL 50 MG PO TABS: 50.0000 mg | ORAL_TABLET | Freq: Four times a day (QID) | ORAL | 0 refills | Status: DC | PRN

## 2022-09-03 MED ORDER — CEFAZOLIN SODIUM-DEXTROSE 2-4 GM/100ML-% IV SOLN
INTRAVENOUS | Status: AC
  Filled 2022-09-03: qty 100

## 2022-09-03 MED ORDER — IOHEXOL 300 MG/ML  SOLN
INTRAMUSCULAR | Status: DC | PRN
  Administered 2022-09-03: 7 mL

## 2022-09-03 MED ORDER — EPHEDRINE 5 MG/ML INJ
INTRAVENOUS | Status: AC
  Filled 2022-09-03: qty 5

## 2022-09-03 MED ORDER — PHENYLEPHRINE 80 MCG/ML (10ML) SYRINGE FOR IV PUSH (FOR BLOOD PRESSURE SUPPORT)
PREFILLED_SYRINGE | INTRAVENOUS | Status: AC
  Filled 2022-09-03: qty 10

## 2022-09-03 MED ORDER — HYDROMORPHONE HCL 1 MG/ML IJ SOLN
INTRAMUSCULAR | Status: AC
  Filled 2022-09-03: qty 1

## 2022-09-03 MED ORDER — FENTANYL CITRATE (PF) 250 MCG/5ML IJ SOLN
INTRAMUSCULAR | Status: DC | PRN
  Administered 2022-09-03: 50 ug via INTRAVENOUS
  Administered 2022-09-03: 25 ug via INTRAVENOUS

## 2022-09-03 MED ORDER — AMISULPRIDE (ANTIEMETIC) 5 MG/2ML IV SOLN: 10.0000 mg | Freq: Once | INTRAVENOUS | Status: DC | PRN

## 2022-09-03 MED ORDER — FLEET ENEMA 7-19 GM/118ML RE ENEM: 1.0000 | ENEMA | Freq: Once | RECTAL | Status: DC

## 2022-09-03 MED ORDER — CEFAZOLIN SODIUM-DEXTROSE 2-4 GM/100ML-% IV SOLN
2.0000 g | Freq: Once | INTRAVENOUS | Status: AC
  Administered 2022-09-03: 2 g via INTRAVENOUS

## 2022-09-03 MED ORDER — LACTATED RINGERS IV SOLN: INTRAVENOUS | Status: DC

## 2022-09-03 MED ORDER — OXYCODONE HCL 5 MG PO TABS
ORAL_TABLET | ORAL | Status: AC
  Filled 2022-09-03: qty 1

## 2022-09-03 MED ORDER — LIDOCAINE 2% (20 MG/ML) 5 ML SYRINGE
INTRAMUSCULAR | Status: DC | PRN
  Administered 2022-09-03: 60 mg via INTRAVENOUS

## 2022-09-03 MED ORDER — HYDROMORPHONE HCL 1 MG/ML IJ SOLN
0.2500 mg | INTRAMUSCULAR | Status: DC | PRN
  Administered 2022-09-03: 0.25 mg via INTRAVENOUS

## 2022-09-03 MED ORDER — OXYCODONE HCL 5 MG PO TABS
5.0000 mg | ORAL_TABLET | Freq: Once | ORAL | Status: AC | PRN
  Administered 2022-09-03: 5 mg via ORAL

## 2022-09-03 MED ORDER — SODIUM CHLORIDE 0.9 % IV SOLN
INTRAVENOUS | Status: AC | PRN
  Administered 2022-09-03: 200 mL via INTRAMUSCULAR

## 2022-09-03 MED ORDER — LIDOCAINE HCL (PF) 2 % IJ SOLN
INTRAMUSCULAR | Status: AC
  Filled 2022-09-03: qty 5

## 2022-09-03 MED ORDER — SODIUM CHLORIDE (PF) 0.9 % IJ SOLN
INTRAMUSCULAR | Status: DC | PRN
  Administered 2022-09-03: 10 mL

## 2022-09-03 MED ORDER — FENTANYL CITRATE (PF) 100 MCG/2ML IJ SOLN
INTRAMUSCULAR | Status: AC
  Filled 2022-09-03: qty 2

## 2022-09-03 MED ORDER — PROPOFOL 10 MG/ML IV BOLUS
INTRAVENOUS | Status: DC | PRN
  Administered 2022-09-03: 120 mg via INTRAVENOUS

## 2022-09-03 MED ORDER — DEXAMETHASONE SODIUM PHOSPHATE 10 MG/ML IJ SOLN
INTRAMUSCULAR | Status: DC | PRN
  Administered 2022-09-03: 4 mg via INTRAVENOUS

## 2022-09-03 MED ORDER — PHENYLEPHRINE 80 MCG/ML (10ML) SYRINGE FOR IV PUSH (FOR BLOOD PRESSURE SUPPORT)
PREFILLED_SYRINGE | INTRAVENOUS | Status: DC | PRN
  Administered 2022-09-03 (×3): 80 ug via INTRAVENOUS

## 2022-09-03 MED ORDER — DEXAMETHASONE SODIUM PHOSPHATE 10 MG/ML IJ SOLN
INTRAMUSCULAR | Status: AC
  Filled 2022-09-03: qty 1

## 2022-09-03 MED ORDER — PROMETHAZINE HCL 25 MG/ML IJ SOLN: 6.2500 mg | INTRAMUSCULAR | Status: DC | PRN

## 2022-09-03 MED ORDER — OXYCODONE HCL 5 MG/5ML PO SOLN: 5.0000 mg | Freq: Once | ORAL | Status: AC | PRN

## 2022-09-03 MED ORDER — PROPOFOL 10 MG/ML IV BOLUS
INTRAVENOUS | Status: AC
  Filled 2022-09-03: qty 20

## 2022-09-03 MED ORDER — ONDANSETRON HCL 4 MG/2ML IJ SOLN
INTRAMUSCULAR | Status: DC | PRN
  Administered 2022-09-03: 4 mg via INTRAVENOUS

## 2022-09-03 MED ORDER — ROCURONIUM BROMIDE 10 MG/ML (PF) SYRINGE
PREFILLED_SYRINGE | INTRAVENOUS | Status: DC | PRN
  Administered 2022-09-03: 50 mg via INTRAVENOUS

## 2022-09-03 MED ORDER — ONDANSETRON HCL 4 MG/2ML IJ SOLN
INTRAMUSCULAR | Status: AC
  Filled 2022-09-03: qty 2

## 2022-09-03 MED ORDER — SODIUM CHLORIDE 0.9 % IV SOLN: INTRAVENOUS | Status: DC | PRN

## 2022-09-03 SURGICAL SUPPLY — 46 items
BAG DRN RND TRDRP ANRFLXCHMBR (UROLOGICAL SUPPLIES) ×3
BAG URINE DRAIN 2000ML AR STRL (UROLOGICAL SUPPLIES) ×3 IMPLANT
BLADE CLIPPER SENSICLIP SURGIC (BLADE) ×3 IMPLANT
CATH FOLEY 2WAY SLVR  5CC 16FR (CATHETERS) ×6
CATH FOLEY 2WAY SLVR 5CC 16FR (CATHETERS) ×6 IMPLANT
CATH ROBINSON RED A/P 20FR (CATHETERS) ×3 IMPLANT
CLOTH BEACON ORANGE TIMEOUT ST (SAFETY) ×3 IMPLANT
CNTNR URN SCR LID CUP LEK RST (MISCELLANEOUS) ×3 IMPLANT
CONT SPEC 4OZ STRL OR WHT (MISCELLANEOUS) ×3
COVER BACK TABLE 60X90IN (DRAPES) ×3 IMPLANT
COVER MAYO STAND STRL (DRAPES) ×3 IMPLANT
DRSG TEGADERM 4X4.75 (GAUZE/BANDAGES/DRESSINGS) ×3 IMPLANT
DRSG TEGADERM 8X12 (GAUZE/BANDAGES/DRESSINGS) ×3 IMPLANT
GAUZE SPONGE 4X4 12PLY STRL LF (GAUZE/BANDAGES/DRESSINGS) IMPLANT
GEL ULTRASOUND 20GR AQUASONIC (MISCELLANEOUS) ×6 IMPLANT
GLOVE BIO SURGEON STRL SZ 6.5 (GLOVE) ×3 IMPLANT
GLOVE BIO SURGEON STRL SZ7.5 (GLOVE) IMPLANT
GLOVE BIO SURGEON STRL SZ8 (GLOVE) ×3 IMPLANT
GLOVE BIOGEL PI IND STRL 6.5 (GLOVE) IMPLANT
GLOVE SURG ORTHO 8.5 STRL (GLOVE) ×3 IMPLANT
GLOVE SURG SS PI 6.5 STRL IVOR (GLOVE) IMPLANT
GOWN STRL REUS W/TWL XL LVL3 (GOWN DISPOSABLE) ×3 IMPLANT
GRID BRACH TEMP 18GA 2.8X3X.75 (MISCELLANEOUS) ×3 IMPLANT
HOLDER FOLEY CATH W/STRAP (MISCELLANEOUS) ×3 IMPLANT
IMPL SPACEOAR VUE SYSTEM (Spacer) ×3 IMPLANT
IMPLANT SPACEOAR VUE SYSTEM (Spacer) ×3 IMPLANT
IV NS 1000ML (IV SOLUTION) ×3
IV NS 1000ML BAXH (IV SOLUTION) ×3 IMPLANT
KIT TURNOVER CYSTO (KITS) ×3 IMPLANT
MANIFOLD NEPTUNE II (INSTRUMENTS) IMPLANT
NDL BRACHY 18G 5PK (NEEDLE) ×12 IMPLANT
NDL BRACHY 18G SINGLE (NEEDLE) IMPLANT
NDL PK MORGANSTERN STABILIZ (NEEDLE) ×3 IMPLANT
NEEDLE BRACHY 18G 5PK (NEEDLE) ×12 IMPLANT
NEEDLE BRACHY 18G SINGLE (NEEDLE) IMPLANT
NEEDLE PK MORGANSTERN STABILIZ (NEEDLE) ×3 IMPLANT
PACK CYSTO (CUSTOM PROCEDURE TRAY) ×3 IMPLANT
SHEATH ULTRASOUND LF (SHEATH) IMPLANT
SHEATH ULTRASOUND LTX NONSTRL (SHEATH) IMPLANT
SYR 10ML LL (SYRINGE) ×6 IMPLANT
SYR CONTROL 10ML LL (SYRINGE) ×3 IMPLANT
TOWEL OR 17X26 10 PK STRL BLUE (TOWEL DISPOSABLE) ×6 IMPLANT
UNDERPAD 30X36 HEAVY ABSORB (UNDERPADS AND DIAPERS) ×6 IMPLANT
WATER STERILE IRR 3000ML UROMA (IV SOLUTION) ×3 IMPLANT
WATER STERILE IRR 500ML POUR (IV SOLUTION) ×3 IMPLANT
bard quicklink I-125 seeds IMPLANT

## 2022-09-03 NOTE — Transfer of Care (Signed)
Immediate Anesthesia Transfer of Care Note  Patient: Sergio Hughes  Procedure(s) Performed: RADIOACTIVE SEED IMPLANT/BRACHYTHERAPY IMPLANT (Prostate) SPACE OAR INSTILLATION (Perineum) CYSTOSCOPY (Bladder)  Patient Location: PACU  Anesthesia Type:General  Level of Consciousness: awake, alert  and oriented  Airway & Oxygen Therapy: Patient Spontanous Breathing  Post-op Assessment: Report given to RN and Post -op Vital signs reviewed and stable  Post vital signs: Reviewed and stable  Last Vitals:  Vitals Value Taken Time  BP 123/58 09/03/22 1501  Temp    Pulse 73 09/03/22 1504  Resp 12 09/03/22 1504  SpO2 96 % 09/03/22 1504  Vitals shown include unvalidated device data.  Last Pain:  Vitals:   09/03/22 1154  TempSrc: Oral         Complications: No notable events documented.

## 2022-09-03 NOTE — Discharge Instructions (Addendum)

## 2022-09-03 NOTE — Anesthesia Procedure Notes (Signed)
Procedure Name: Intubation Date/Time: 09/03/2022 1:51 PM  Performed by: Clearnce Sorrel, CRNAPre-anesthesia Checklist: Patient identified, Emergency Drugs available, Suction available and Patient being monitored Patient Re-evaluated:Patient Re-evaluated prior to induction Oxygen Delivery Method: Circle System Utilized Preoxygenation: Pre-oxygenation with 100% oxygen Induction Type: IV induction Ventilation: Mask ventilation without difficulty Laryngoscope Size: Mac and 3 Tube type: Oral Tube size: 7.5 mm Number of attempts: 1 Airway Equipment and Method: Stylet and Oral airway Placement Confirmation: ETT inserted through vocal cords under direct vision, positive ETCO2 and breath sounds checked- equal and bilateral Secured at: 23 cm Tube secured with: Tape Dental Injury: Teeth and Oropharynx as per pre-operative assessment

## 2022-09-03 NOTE — Op Note (Signed)
PRE-OPERATIVE DIAGNOSIS:  Adenocarcinoma of the prostate  POST-OPERATIVE DIAGNOSIS:  Same  PROCEDURE:  Procedure(s): 1. I-125 radioactive seed implantation 2. Cystoscopy 3. SpaceOAR placement  SURGEON:  Surgeon(s): Patrick Mckenzie, MD  Radiation oncologist: Matthew Manning, MD  ANESTHESIA:  General  EBL:  Minimal  DRAINS: 16 French Foley catheter  INDICATION: Sergio Hughes is a 81 year old with a history of T1c prostate cancer. After discussing treatment options he has elected to proceed with brachytherapy  Description of procedure: After informed consent the patient was brought to the major OR, placed on the table and administered general anesthesia. He was then moved to the modified lithotomy position with his perineum perpendicular to the floor. His perineum and genitalia were then sterilely prepped. An official timeout was then performed. A 16 French Foley catheter was then placed in the bladder and filled with dilute contrast, a rectal tube was placed in the rectum and the transrectal ultrasound probe was placed in the rectum and affixed to the stand. He was then sterilely draped.  Real time ultrasonography was used along with the seed planning software [employer] Prostate vs. 4.2.21. This was used to develop the seed plan including the number of needles as well as number of seeds required for complete and adequate coverage. Real-time ultrasonography was then used along with the previously developed plan and the Nucletron device to implant a total of 67 seeds using 19 needles. This proceeded without difficulty or complication.  We then proceeded to mix the SpaceOAR using the kit supplied from the manufacturer. Once this was complete we placed a sinal needle into the perirectal fat between the rectum and the prostate. Once this was accomplished we injected 2cc of normal saline to hydrodissect the plain. We then instilled the the SpaceOAR through the spinal needle and noted good  distribution in the perirectal fat.    A Foley catheter was then removed as well as the transrectal ultrasound probe and rectal probe. Flexible cystoscopy was then performed using the 17 French flexible scope which revealed a normal urethra throughout its length down to the sphincter which appeared intact. The prostatic urethra revealed bilobar hypertrophy but no evidence of obstruction, seeds, spacers or lesions. The bladder was then entered and fully and systematically inspected. The ureteral orifices were noted to be of normal configuration and position. The mucosa revealed no evidence of tumors. There were also no stones identified within the bladder. I noted no seeds or spacers on the floor of the bladder and retroflexion of the scope revealed no seeds protruding from the base of the prostate.  The cystoscope was then removed and a new 16 French Foley catheter was then inserted and the balloon was filled with 10 cc of sterile water. This was connected to closed system drainage and the patient was awakened and taken to recovery room in stable and satisfactory condition. He tolerated procedure well and there were no intraoperative complications.  

## 2022-09-03 NOTE — Anesthesia Preprocedure Evaluation (Signed)
Anesthesia Evaluation  Patient identified by MRN, date of birth, ID band Patient awake    Reviewed: Allergy & Precautions, H&P , NPO status , Patient's Chart, lab work & pertinent test results  History of Anesthesia Complications Negative for: history of anesthetic complications  Airway Mallampati: II  TM Distance: >3 FB Neck ROM: full    Dental no notable dental hx.    Pulmonary former smoker,    Pulmonary exam normal breath sounds clear to auscultation       Cardiovascular hypertension, Pt. on medications + CAD  Normal cardiovascular exam Rhythm:regular Rate:Normal     Neuro/Psych  Headaches,    GI/Hepatic Neg liver ROS, GERD  ,  Endo/Other  negative endocrine ROS  Renal/GU Renal InsufficiencyRenal disease     Musculoskeletal  (+) Arthritis , Osteoarthritis,    Abdominal   Peds  Hematology negative hematology ROS (+)   Anesthesia Other Findings EKG reviewed with RBBB  Reproductive/Obstetrics negative OB ROS                             Anesthesia Physical  Anesthesia Plan  ASA: 3  Anesthesia Plan: General   Post-op Pain Management: Minimal or no pain anticipated   Induction: Intravenous  PONV Risk Score and Plan: 2 and Ondansetron, Midazolam and Treatment may vary due to age or medical condition  Airway Management Planned: Oral ETT  Additional Equipment:   Intra-op Plan:   Post-operative Plan: Extubation in OR  Informed Consent: I have reviewed the patients History and Physical, chart, labs and discussed the procedure including the risks, benefits and alternatives for the proposed anesthesia with the patient or authorized representative who has indicated his/her understanding and acceptance.     Dental Advisory Given  Plan Discussed with: Anesthesiologist and CRNA  Anesthesia Plan Comments:         Anesthesia Quick Evaluation

## 2022-09-03 NOTE — H&P (Signed)
Urology Admission H&P  Chief Complaint: prostate cancer  History of Present Illness: Sergio Hughes is a 81yo here for brachytherapy with SpaceOAR for prostate cancer. He is currently on rapaflo '8mg'$  daily for his BPH. No other complaints today  Past Medical History:  Diagnosis Date   Bilateral carotid bruits    US carotid bilateral 05-14-2022 epic   Chronic kidney disease stage 4    Coronary artery calcification seen on CT scan    Hyperlipidemia    Hypertension    per pt on 08-25-2022 off amlodipine last 3 to 4 months per nephrology dr Louanna Raw   Osteoarthritis    Prostate cancer [facility])    RBBB    Past Surgical History:  Procedure Laterality Date   COLONOSCOPY     INGUINAL HERNIA REPAIR  2005   Right   LESION REMOVAL Left 09/23/2020   Procedure: MINOR EXICISION OF LESION OF LEFT EAR;  Surgeon: Izora Gala, MD;  Location: [place];  Service: ENT;  Laterality: Left;   PARTIAL COLECTOMY Right 12/16/2015   Procedure: PARTIAL CECECTOMY AND APPENDECTOMY;  Surgeon: Judeth Horn, MD;  Location: [place];  Service: General;  Laterality: Right;   POLYPECTOMY     VASECTOMY      Home Medications:  Current Facility-Administered Medications  Medication Dose Route Frequency Provider Last Rate Last Admin   ceFAZolin (ANCEF) IVPB 2g/100 mL premix  2 g Intravenous Once Youssef Footman, Candee Furbish, MD       iohexol (OMNIPAQUE) 300 MG/ML solution    PRN Cleon Gustin, MD   7 mL at 09/03/22 1249   lactated ringers infusion   Intravenous Continuous Ellender, Karyl Kinnier, MD 50 mL/hr at 09/03/22 1214 New Bag at 09/03/22 1214   sodium chloride (PF) 0.9 % injection    PRN Cleon Gustin, MD   10 mL at 09/03/22 1250   [START ON 09/04/2022] sodium phosphate (FLEET) 7-19 GM/118ML enema 1 enema  1 enema Rectal Once Alyson Ingles Candee Furbish, MD       Allergies:  Allergies  Allergen Reactions   Bupropion Hcl     REACTION: extreme dizziness   Lovastatin     myalgias   Uroxatral [Alfuzosin]      Weakness, tinnitus    Family History  Problem Relation Age of Onset   Hypertension Father    Cancer Father        brain ca   Prostate cancer Brother 32   Colon cancer Neg Hx    Rectal cancer Neg Hx    Stomach cancer Neg Hx    Esophageal cancer Neg Hx    Social History:  reports that he quit smoking about 56 years ago. His smoking use included cigarettes. He has a 5.00 pack-year smoking history. He has never used smokeless tobacco. He reports that he does not currently use alcohol. He reports that he does not use drugs.  Review of Systems  All other systems reviewed and are negative.   Physical Exam:  Vital signs in last 24 hours: Temp:  [97.5 F (36.4 C)] 97.5 F (36.4 C) (10/12 1154) Pulse Rate:  [69] 69 (10/12 1154) Resp:  [16] 16 (10/12 1154) BP: (139)/(68) 139/68 (10/12 1154) SpO2:  [97 %] 97 % (10/12 1154) Weight:  [62.4 kg] 62.4 kg (10/12 1154) Physical Exam Vitals reviewed.  Constitutional:      Appearance: Normal appearance.  HENT:     Head: Normocephalic and atraumatic.     Nose: Nose normal. No congestion.  Mouth/Throat:     Mouth: Mucous membranes are dry.  Eyes:     Extraocular Movements: Extraocular movements intact.     Pupils: Pupils are equal, round, and reactive to light.  Cardiovascular:     Rate and Rhythm: Normal rate and regular rhythm.  Pulmonary:     Effort: Pulmonary effort is normal. No respiratory distress.  Abdominal:     General: Abdomen is flat. There is no distension.  Musculoskeletal:        General: No swelling. Normal range of motion.     Cervical back: Normal range of motion and neck supple.  Skin:    General: Skin is warm and dry.  Neurological:     General: No focal deficit present.     Mental Status: He is alert and oriented to person, place, and time.  Psychiatric:        Mood and Affect: Mood normal.        Behavior: Behavior normal.        Thought Content: Thought content normal.        Judgment: Judgment normal.      Laboratory Data:  Results for orders placed or performed during the hospital encounter of 09/03/22 (from the past 24 hour(s))  I-STAT, chem 8     Status: Abnormal   Collection Time: 09/03/22 12:12 PM  Result Value Ref Range   Sodium 143 135 - 145 mmol/L   Potassium 3.9 3.5 - 5.1 mmol/L   Chloride 107 98 - 111 mmol/L   BUN 73 (H) 8 - 23 mg/dL   Creatinine, Ser 3.50 (H) 0.61 - 1.24 mg/dL   Glucose, Bld 88 70 - 99 mg/dL   Calcium, Ion 1.23 1.15 - 1.40 mmol/L   TCO2 24 22 - 32 mmol/L   Hemoglobin 11.9 (L) 13.0 - 17.0 g/dL   HCT 35.0 (L) 39.0 - 52.0 %   No results found for this or any previous visit (from the past 240 hour(s)). Creatinine: Recent Labs    09/03/22 1212  CREATININE 3.50*   Baseline Creatinine: 2.3  Impression/Assessment:  81yo with prostate cancer  Plan:  I discussed the natural history of intermediate risk prostate cancer with the patient and the various treatment options including active surveillance, RALP, IMRT, brachytherapy, cryotherapy, HIFU and ADT. After discussing the options the patient elects for brachytherapy with Sergio Hughes 09/03/2022, 1:33 PM

## 2022-09-03 NOTE — Progress Notes (Signed)
Radiation Oncology         [phone] ________________________________  Name: Sergio Hughes MRN: [id number]  Date: 09/03/2022  DOB: 03/29/1941       Prostate Seed Implant  GU:[id number], [name], MD  No ref. provider found  DIAGNOSIS:  Oncology History  Malignant neoplasm of prostate ([place])  04/29/2022 Cancer Staging   Staging form: Prostate, AJCC 8th Edition - Clinical stage from 04/29/2022: Stage IIB (cT1c, cN0, cM0, PSA: 11.9, Grade Group: 2) - Signed by Freeman Caldron, PA-C on 05/28/2022 Histopathologic type: Adenocarcinoma, NOS Prostate specific antigen (PSA) range: 10 to 19 Gleason primary pattern: 3 Gleason secondary pattern: 4 Gleason score: 7 Histologic grading system: 5 grade system Number of biopsy cores examined: 12 Number of biopsy cores positive: 1 Location of positive needle core biopsies: One side   05/28/2022 Initial Diagnosis   Malignant neoplasm of prostate ([place])       ICD-10-CM   1. Pre-op testing  Z01.818 CBG per Guidelines for Diabetes Management for Patients Undergoing Surgery ([facility], [facility], and [facility] only)    CBG per protocol    I-Stat, Chem 8 on day of surgery per protocol    CBG per Guidelines for Diabetes Management for Patients Undergoing Surgery ([facility], [facility], and [facility] only)    CBG per protocol    I-Stat, Chem 8 on day of surgery per protocol    CANCELED: EKG 12 lead per protocol      PROCEDURE: Insertion of radioactive I-125 seeds into the prostate gland.  RADIATION DOSE: 145 Gy, definitive therapy.  TECHNIQUE: Sergio Hughes was brought to the operating room with the urologist. He was placed in the dorsolithotomy position. He was catheterized and a rectal tube was inserted. The perineum was shaved, prepped and draped. The ultrasound probe was then introduced into the rectum to see the prostate gland.  TREATMENT DEVICE: A needle grid was attached to the ultrasound probe stand and anchor needles were placed.  3D PLANNING: The prostate was imaged in 3D using a  sagittal sweep of the prostate probe. These images were transferred to the planning computer. There, the prostate, urethra and rectum were defined on each axial reconstructed image. Then, the software created an optimized 3D plan and a few seed positions were adjusted. The quality of the plan was reviewed using [facility] information for the target and the following two organs at risk:  Urethra and Rectum.  Then the accepted plan was printed and handed off to the radiation therapist.  Under my supervision, the custom loading of the seeds and spacers was carried out and loaded into sealed vicryl sleeves.  These pre-loaded needles were then placed into the needle holder.Marland Kitchen  PROSTATE VOLUME STUDY:  Using transrectal ultrasound the volume of the prostate was verified to be 30 cc.  SPECIAL TREATMENT PROCEDURE/SUPERVISION AND HANDLING: The pre-loaded needles were then delivered under sagittal guidance. A total of 19 needles were used to deposit 67 seeds in the prostate gland. The individual seed activity was 0.369 mCi.  SpaceOAR:  Yes  COMPLEX SIMULATION: At the end of the procedure, an anterior radiograph of the pelvis was obtained to document seed positioning and count. Cystoscopy was performed to check the urethra and bladder.  MICRODOSIMETRY: At the end of the procedure, the patient was emitting 0.07 mR/hr at 1 meter. Accordingly, he was considered safe for hospital discharge.  PLAN: The patient will return to the radiation oncology clinic for post implant CT dosimetry in three weeks.   ________________________________  Rodman Key  Frazier Richards, M.D.

## 2022-09-04 ENCOUNTER — Encounter (HOSPITAL_BASED_OUTPATIENT_CLINIC_OR_DEPARTMENT_OTHER): Payer: Self-pay | Admitting: Urology

## 2022-09-04 NOTE — Anesthesia Postprocedure Evaluation (Signed)
Anesthesia Post Note  Patient: Sergio Hughes  Procedure(s) Performed: RADIOACTIVE SEED IMPLANT/BRACHYTHERAPY IMPLANT (Prostate) SPACE OAR INSTILLATION (Perineum) CYSTOSCOPY (Bladder)     Patient location during evaluation: PACU Anesthesia Type: General Level of consciousness: awake and alert Pain management: pain level controlled Vital Signs Assessment: post-procedure vital signs reviewed and stable Respiratory status: spontaneous breathing, nonlabored ventilation and respiratory function stable Cardiovascular status: blood pressure returned to baseline and stable Postop Assessment: no apparent nausea or vomiting Anesthetic complications: no   No notable events documented.  Last Vitals:  Vitals:   09/03/22 1530 09/03/22 1620  BP: 132/69 122/66  Pulse: 73 73  Resp: (!) 22 16  Temp: (!) 36.2 C   SpO2: 98% 97%    Last Pain:  Vitals:   09/03/22 1620  TempSrc:   PainSc: 2                  Lynda Rainwater

## 2022-09-09 ENCOUNTER — Ambulatory Visit (INDEPENDENT_AMBULATORY_CARE_PROVIDER_SITE_OTHER): Payer: Medicare HMO | Admitting: Urology

## 2022-09-09 VITALS — BP 119/68 | HR 87

## 2022-09-09 DIAGNOSIS — C61 Malignant neoplasm of prostate: Secondary | ICD-10-CM

## 2022-09-09 NOTE — Progress Notes (Signed)
09/09/2022 9:28 AM   Laurice Record Paulla Dolly 1941/08/09 [id number]  Referring provider: Cassandria Anger, MD [place],  [place] [id number]  Followup voiding trail   HPI: Mr Westling is a 81yo here for voiding trial after brachytherapy. Voiding trial passed today. No other complaints   PMH: Past Medical History:  Diagnosis Date   Bilateral carotid bruits    US carotid bilateral 05-14-2022 epic   Chronic kidney disease stage 4    Coronary artery calcification seen on CT scan    Hyperlipidemia    Hypertension    per pt on 08-25-2022 off amlodipine last 3 to 4 months per nephrology dr Louanna Raw   Osteoarthritis    Prostate cancer [facility])    RBBB     Surgical History: Past Surgical History:  Procedure Laterality Date   COLONOSCOPY     CYSTOSCOPY N/A 09/03/2022   Procedure: CYSTOSCOPY;  Surgeon: Cleon Gustin, MD;  Location: [facility];  Service: Urology;  Laterality: N/A;   INGUINAL HERNIA REPAIR  2005   Right   LESION REMOVAL Left 09/23/2020   Procedure: MINOR EXICISION OF LESION OF LEFT EAR;  Surgeon: Izora Gala, MD;  Location: [place];  Service: ENT;  Laterality: Left;   PARTIAL COLECTOMY Right 12/16/2015   Procedure: PARTIAL CECECTOMY AND APPENDECTOMY;  Surgeon: Judeth Horn, MD;  Location: [place];  Service: General;  Laterality: Right;   POLYPECTOMY     RADIOACTIVE SEED IMPLANT N/A 09/03/2022   Procedure: RADIOACTIVE SEED IMPLANT/BRACHYTHERAPY IMPLANT;  Surgeon: Cleon Gustin, MD;  Location: [facility];  Service: Urology;  Laterality: N/A;   SPACE OAR INSTILLATION N/A 09/03/2022   Procedure: SPACE OAR INSTILLATION;  Surgeon: Cleon Gustin, MD;  [facility];  Service: Urology;  Laterality: N/A;   VASECTOMY      Home Medications:  Allergies as of 09/09/2022       Reactions   Bupropion Hcl    REACTION: extreme dizziness   Lovastatin    myalgias   Uroxatral [alfuzosin]     Weakness, tinnitus        Medication List        Accurate as of September 09, 2022  9:28 AM. If you have any questions, ask your nurse or doctor.          aspirin EC 81 MG tablet Take 81 mg by mouth daily.   famotidine 40 MG tablet Commonly known as: Pepcid Take 1 tablet (40 mg total) by mouth daily. What changed:  when to take this reasons to take this   Fish Oil 1000 MG Caps 1,200 mg daily at 2 PM.   Glucosamine 1500 Complex Caps Take by mouth daily at 2 PM.   loratadine 10 MG tablet Commonly known as: CLARITIN Take 1 tablet (10 mg total) by mouth daily. What changed:  when to take this reasons to take this   pravastatin 20 MG tablet Commonly known as: PRAVACHOL TAKE 1 TABLET EVERY DAY   silodosin 8 MG Caps capsule Commonly known as: RAPAFLO Take 1 capsule (8 mg total) by mouth at bedtime.   traMADol 50 MG tablet Commonly known as: Ultram Take 1 tablet (50 mg total) by mouth every 6 (six) hours as needed.   Vitamin D 50 MCG (2000 UT) Caps Take 1 capsule by mouth daily.        Allergies:  Allergies  Allergen Reactions   Bupropion Hcl     REACTION:  extreme dizziness   Lovastatin     myalgias   Uroxatral [Alfuzosin]     Weakness, tinnitus    Family History: Family History  Problem Relation Age of Onset   Hypertension Father    Cancer Father        brain ca   Prostate cancer Brother 32   Colon cancer Neg Hx    Rectal cancer Neg Hx    Stomach cancer Neg Hx    Esophageal cancer Neg Hx     Social History:  reports that he quit smoking about 56 years ago. His smoking use included cigarettes. He has a 5.00 pack-year smoking history. He has never used smokeless tobacco. He reports that he does not currently use alcohol. He reports that he does not use drugs.  ROS: All other review of systems were reviewed and are negative except what is noted above in HPI  Physical Exam: BP 119/68   Pulse 87   Constitutional:  Alert and oriented, No  acute distress. HEENT: [place] AT, moist mucus membranes.  Trachea midline, no masses. Cardiovascular: No clubbing, cyanosis, or edema. Respiratory: Normal respiratory effort, no increased work of breathing. GI: Abdomen is soft, nontender, nondistended, no abdominal masses GU: No CVA tenderness.  Lymph: No cervical or inguinal lymphadenopathy. Skin: No rashes, bruises or suspicious lesions. Neurologic: Grossly intact, no focal deficits, moving all 4 extremities. Psychiatric: Normal mood and affect.  Laboratory Data: Lab Results  Component Value Date   WBC 6.1 08/28/2022   HGB 11.9 (L) 09/03/2022   HCT 35.0 (L) 09/03/2022   MCV 90 08/28/2022   PLT 166 08/28/2022    Lab Results  Component Value Date   CREATININE 3.50 (H) 09/03/2022    Lab Results  Component Value Date   PSA 11.03 (H) 10/23/2021   PSA 6.35 (H) 09/29/2017   PSA 6.51 (H) 05/24/2017    No results found for: "TESTOSTERONE"  Lab Results  Component Value Date   HGBA1C 5.8 (H) 12/09/2015    Urinalysis    Component Value Date/Time   COLORURINE YELLOW 10/23/2021 1016   APPEARANCEUR Clear 06/12/2022 1229   LABSPEC 1.020 10/23/2021 1016   PHURINE 5.5 10/23/2021 1016   GLUCOSEU Negative 06/12/2022 1229   GLUCOSEU NEGATIVE 10/23/2021 1016   HGBUR NEGATIVE 10/23/2021 1016   BILIRUBINUR Negative 06/12/2022 1229   KETONESUR NEGATIVE 10/23/2021 1016   PROTEINUR 2+ (A) 06/12/2022 1229   UROBILINOGEN 0.2 10/23/2021 1016   NITRITE Negative 06/12/2022 1229   NITRITE NEGATIVE 10/23/2021 1016   LEUKOCYTESUR Negative 06/12/2022 1229   LEUKOCYTESUR NEGATIVE 10/23/2021 1016    Lab Results  Component Value Date   LABMICR See below: 06/12/2022   WBCUA None seen 06/12/2022   LABEPIT None seen 06/12/2022   MUCUS Present 06/12/2022   BACTERIA None seen 06/12/2022    Pertinent Imaging:  No results found for this or any previous visit.  No results found for this or any previous visit.  No results found for this or  any previous visit.  No results found for this or any previous visit.  Results for orders placed during the hospital encounter of 05/14/22  US RENAL  Narrative CLINICAL DATA:  Chronic renal disease  EXAM: RENAL / URINARY TRACT ULTRASOUND COMPLETE  COMPARISON:  Renal ultrasound 05/10/2020  FINDINGS: Right Kidney:  Renal measurements: 8.5 x 4.0 x 3.8 cm = volume: 67.2 mL. Renal cortical thinning. Increased cortical echogenicity. No hydronephrosis.  Left Kidney:  Renal measurements: 9.5 x 4.6 x 4.2 cm =  volume: 94.2 mL. Renal cortical thinning. Increased renal cortical echogenicity. No hydronephrosis. Probable 3 mm stone inferior pole.  Bladder:  Appears normal for degree of bladder distention.  Other:  None.  IMPRESSION: No hydronephrosis.  Increased cortical echogenicity compatible with chronic medical renal disease.   Electronically Signed By: Lovey Newcomer M.D. On: 05/14/2022 15:05  No valid procedures specified. No results found for this or any previous visit.  No results found for this or any previous visit.   Assessment & Plan:    1. Prostate cancer ([place]) -Voiding trial passed today. RTC 2 weeks for symptom check. - Bladder Voiding Trial   No follow-ups on file.  Nicolette Bang, MD  [facility]

## 2022-09-09 NOTE — Progress Notes (Signed)
Fill and Pull Catheter Removal  Patient is present today for a catheter removal.  Patient was cleaned and prepped in a sterile fashion 230m of sterile water/ saline was instilled into the bladder when the patient felt the urge to urinate. 128mof water was then drained from the balloon.  A 16FR foley cath was removed from the bladder no complications were noted .  Patient as then given some time to void on their own.  Patient can void  [id number] their own after some time.  Patient tolerated well.  Performed by: Pearce Littlefield LPN  Follow up/ Additional notes: Per MD note

## 2022-09-14 ENCOUNTER — Telehealth: Payer: Self-pay

## 2022-09-14 ENCOUNTER — Ambulatory Visit (INDEPENDENT_AMBULATORY_CARE_PROVIDER_SITE_OTHER): Payer: Medicare HMO | Admitting: Internal Medicine

## 2022-09-14 ENCOUNTER — Encounter: Payer: Self-pay | Admitting: Internal Medicine

## 2022-09-14 VITALS — BP 138/72 | HR 86 | Temp 98.9°F | Ht 65.0 in | Wt 138.8 lb

## 2022-09-14 DIAGNOSIS — C61 Malignant neoplasm of prostate: Secondary | ICD-10-CM

## 2022-09-14 DIAGNOSIS — R972 Elevated prostate specific antigen [PSA]: Secondary | ICD-10-CM | POA: Diagnosis not present

## 2022-09-14 DIAGNOSIS — R300 Dysuria: Secondary | ICD-10-CM

## 2022-09-14 LAB — URINALYSIS, ROUTINE W REFLEX MICROSCOPIC
Bilirubin Urine: NEGATIVE
Ketones, ur: NEGATIVE
Nitrite: POSITIVE — AB
Specific Gravity, Urine: 1.01 (ref 1.000–1.030)
Total Protein, Urine: 100 — AB
Urine Glucose: NEGATIVE
Urobilinogen, UA: 0.2 (ref 0.0–1.0)
pH: 5.5 (ref 5.0–8.0)

## 2022-09-14 MED ORDER — CIPROFLOXACIN HCL 500 MG PO TABS: 500.0000 mg | ORAL_TABLET | Freq: Two times a day (BID) | ORAL | 0 refills | Status: AC

## 2022-09-14 NOTE — Assessment & Plan Note (Signed)
New On Rapaflo Try AZO s/p XRT seeds on 09/03/22 UA Cipro x 7 d if abn UA

## 2022-09-14 NOTE — Patient Instructions (Signed)
AZO for urinary symptoms

## 2022-09-14 NOTE — Telephone Encounter (Addendum)
Patient called with concerns with symptoms of urine frequency and burning with urination.  Patient was unable to come in office today due to not having a caregiver for his wife, he has an apt with PCP today.  He will have them check for a UTI and forward results to our office.  Verbal from Dr. Ander Slade that the symptoms he is experiencing are common post brachytherapy, if pt feels he is not emptying his bladder he can increase rapaflo BID, 1 in the am and 1 in pm.  Patient is aware of MD response and still wants to have PCP check his urine.  He also voiced understanding on increasing rx if necessary.

## 2022-09-14 NOTE — Assessment & Plan Note (Signed)
s/p XRT seeds on 09/03/22

## 2022-09-14 NOTE — Progress Notes (Signed)
Subjective:  Patient ID: Sergio Hughes, male    DOB: Apr 11, 1941  Age: 81 y.o. MRN: [id number]  CC: Follow-up (NEED TO DISCUSS PROSTATE SEED IMPLANT)   HPI CASHTYN POULIOT presents for prostate cancer - s/p XRT seeds on 09/03/22. C/o urgency and pain. On Rapaflo. C/o nocturia, pain. Cath is out since last Wed  Outpatient Medications Prior to Visit  Medication Sig Dispense Refill   aspirin 81 MG EC tablet Take 81 mg by mouth daily.     Cholecalciferol (VITAMIN D) 2000 UNITS CAPS Take 1 capsule by mouth daily.     famotidine (PEPCID) 40 MG tablet Take 1 tablet (40 mg total) by mouth daily. (Patient taking differently: Take 40 mg by mouth as needed.) 30 tablet 3   Glucosamine-Chondroit-Vit C-Mn (GLUCOSAMINE 1500 COMPLEX) CAPS Take by mouth daily at 2 PM.     loratadine (CLARITIN) 10 MG tablet Take 1 tablet (10 mg total) by mouth daily. (Patient taking differently: Take 10 mg by mouth daily as needed.) 30 tablet 3   Omega-3 Fatty Acids (FISH OIL) 1000 MG CAPS 1,200 mg daily at 2 PM.     pravastatin (PRAVACHOL) 20 MG tablet TAKE 1 TABLET EVERY DAY 90 tablet 3   silodosin (RAPAFLO) 8 MG CAPS capsule Take 1 capsule (8 mg total) by mouth at bedtime. 30 capsule 11   traMADol (ULTRAM) 50 MG tablet Take 1 tablet (50 mg total) by mouth every 6 (six) hours as needed. 15 tablet 0   No facility-administered medications prior to visit.    ROS: Review of Systems  Constitutional:  Positive for chills and fatigue. Negative for appetite change, fever and unexpected weight change.  HENT:  Negative for congestion, nosebleeds, sneezing, sore throat and trouble swallowing.   Eyes:  Negative for itching and visual disturbance.  Respiratory:  Negative for cough.   Cardiovascular:  Negative for chest pain, palpitations and leg swelling.  Gastrointestinal:  Negative for abdominal distention, blood in stool, diarrhea and nausea.  Genitourinary:  Positive for dysuria. Negative for frequency and hematuria.   Musculoskeletal:  Negative for back pain, gait problem, joint swelling and neck pain.  Skin:  Negative for rash.  Neurological:  Negative for dizziness, tremors, speech difficulty and weakness.  Psychiatric/Behavioral:  Negative for agitation, dysphoric mood, sleep disturbance and suicidal ideas. The patient is not nervous/anxious.     Objective:  BP 138/72 (BP Location: Left Arm)   Pulse 86   Temp 98.9 F (37.2 C) (Oral)   Ht '5\' 5"'$  (1.651 m)   Wt 138 lb 12.8 oz (63 kg)   SpO2 95%   BMI 23.10 kg/m   BP Readings from Last 3 Encounters:  09/14/22 138/72  09/09/22 119/68  09/03/22 122/66    Wt Readings from Last 3 Encounters:  09/14/22 138 lb 12.8 oz (63 kg)  09/03/22 137 lb 9.6 oz (62.4 kg)  08/28/22 141 lb (64 kg)    Physical Exam Constitutional:      General: He is not in acute distress.    Appearance: Normal appearance. He is well-developed.     Comments: NAD  Eyes:     Conjunctiva/sclera: Conjunctivae normal.     Pupils: Pupils are equal, round, and reactive to light.  Neck:     Thyroid: No thyromegaly.     Vascular: No JVD.  Cardiovascular:     Rate and Rhythm: Normal rate and regular rhythm.     Heart sounds: Normal heart sounds. No murmur heard.  No friction rub. No gallop.  Pulmonary:     Effort: Pulmonary effort is normal. No respiratory distress.     Breath sounds: Normal breath sounds. No wheezing or rales.  Chest:     Chest wall: No tenderness.  Abdominal:     General: Bowel sounds are normal. There is no distension.     Palpations: Abdomen is soft. There is no mass.     Tenderness: There is no abdominal tenderness. There is no guarding or rebound.  Musculoskeletal:        General: No tenderness. Normal range of motion.     Cervical back: Normal range of motion.  Lymphadenopathy:     Cervical: No cervical adenopathy.  Skin:    General: Skin is warm and dry.     Findings: No rash.  Neurological:     Mental Status: He is alert and oriented to  person, place, and time.     Cranial Nerves: No cranial nerve deficit.     Motor: No abnormal muscle tone.     Coordination: Coordination normal.     Deep Tendon Reflexes: Reflexes are normal and symmetric.  Psychiatric:        Behavior: Behavior normal.        Thought Content: Thought content normal.        Judgment: Judgment normal.     Lab Results  Component Value Date   WBC 6.1 08/28/2022   HGB 11.9 (L) 09/03/2022   HCT 35.0 (L) 09/03/2022   PLT 166 08/28/2022   GLUCOSE 88 09/03/2022   CHOL 181 10/23/2021   TRIG 76.0 10/23/2021   HDL 82.20 10/23/2021   LDLDIRECT 144.9 11/14/2013   LDLCALC 83 10/23/2021   ALT 15 10/23/2021   AST 22 10/23/2021   NA 143 09/03/2022   K 3.9 09/03/2022   CL 107 09/03/2022   CREATININE 3.50 (H) 09/03/2022   BUN 73 (H) 09/03/2022   CO2 26 10/23/2021   TSH 0.59 10/23/2021   PSA 11.03 (H) 10/23/2021   INR 1.02 12/09/2015   HGBA1C 5.8 (H) 12/09/2015    DG C-Arm 1-60 Min-No Report  Result Date: 09/03/2022 Fluoroscopy was utilized by the requesting physician.  No radiographic interpretation.    Assessment & Plan:   Problem List Items Addressed This Visit     Dysuria - Primary    New On Rapaflo Try AZO s/p XRT seeds on 09/03/22 UA Cipro x 7 d if abn UA      Relevant Orders   Urinalysis   Elevated PSA    s/p XRT seeds on 09/03/22      Malignant neoplasm of prostate ([place])    s/p XRT seeds on 09/03/22      Relevant Medications   ciprofloxacin (CIPRO) 500 MG tablet      Meds ordered this encounter  Medications   ciprofloxacin (CIPRO) 500 MG tablet    Sig: Take 1 tablet (500 mg total) by mouth 2 (two) times daily for 7 days.    Dispense:  14 tablet    Refill:  0      Follow-up: Return in about 3 months (around 12/15/2022) for a follow-up visit.  Walker Kehr, MD

## 2022-09-17 ENCOUNTER — Ambulatory Visit: Payer: Medicare HMO | Admitting: Internal Medicine

## 2022-09-17 ENCOUNTER — Telehealth: Payer: Self-pay | Admitting: *Deleted

## 2022-09-17 NOTE — Telephone Encounter (Signed)
Returned patient's phone call, spoke with patient 

## 2022-09-21 ENCOUNTER — Ambulatory Visit (HOSPITAL_COMMUNITY)
Admission: RE | Admit: 2022-09-21 | Discharge: 2022-09-21 | Disposition: A | Discharge status: Home / Self Care | Payer: Medicare HMO | Source: Ambulatory Visit | Attending: Physician Assistant | Admitting: Physician Assistant

## 2022-09-21 DIAGNOSIS — I10 Essential (primary) hypertension: Secondary | ICD-10-CM | POA: Diagnosis not present

## 2022-09-21 DIAGNOSIS — I4510 Unspecified right bundle-branch block: Secondary | ICD-10-CM | POA: Diagnosis not present

## 2022-09-21 DIAGNOSIS — D649 Anemia, unspecified: Secondary | ICD-10-CM | POA: Insufficient documentation

## 2022-09-21 DIAGNOSIS — Z01810 Encounter for preprocedural cardiovascular examination: Secondary | ICD-10-CM | POA: Diagnosis not present

## 2022-09-21 LAB — ECHOCARDIOGRAM COMPLETE
AR max vel: 2.22 cm2
AV Area VTI: 1.93 cm2
AV Area mean vel: 1.83 cm2
AV Mean grad: 3 mmHg
AV Peak grad: 5.8 mmHg
Ao pk vel: 1.2 m/s
Area-P 1/2: 3.85 cm2
MV VTI: 1.88 cm2
S' Lateral: 2.9 cm

## 2022-09-21 NOTE — Progress Notes (Signed)
*  PRELIMINARY RESULTS* Echocardiogram 2D Echocardiogram has been performed.  Sergio Hughes 09/21/2022, 3:01 PM

## 2022-09-23 ENCOUNTER — Ambulatory Visit: Payer: Medicare HMO | Admitting: Physician Assistant

## 2022-09-23 VITALS — BP 118/66 | HR 76 | Wt 138.0 lb

## 2022-09-23 DIAGNOSIS — Z87440 Personal history of urinary (tract) infections: Secondary | ICD-10-CM

## 2022-09-23 DIAGNOSIS — R972 Elevated prostate specific antigen [PSA]: Secondary | ICD-10-CM | POA: Diagnosis not present

## 2022-09-23 DIAGNOSIS — R300 Dysuria: Secondary | ICD-10-CM | POA: Diagnosis not present

## 2022-09-23 DIAGNOSIS — N401 Enlarged prostate with lower urinary tract symptoms: Secondary | ICD-10-CM

## 2022-09-23 DIAGNOSIS — R351 Nocturia: Secondary | ICD-10-CM | POA: Diagnosis not present

## 2022-09-23 DIAGNOSIS — C61 Malignant neoplasm of prostate: Secondary | ICD-10-CM

## 2022-09-23 LAB — MICROSCOPIC EXAMINATION: RBC, Urine: NONE SEEN /hpf (ref 0–2)

## 2022-09-23 LAB — URINALYSIS, ROUTINE W REFLEX MICROSCOPIC
Bilirubin, UA: NEGATIVE
Glucose, UA: NEGATIVE
Ketones, UA: NEGATIVE
Nitrite, UA: NEGATIVE
Specific Gravity, UA: 1.01 (ref 1.005–1.030)
Urobilinogen, Ur: 0.2 mg/dL (ref 0.2–1.0)
pH, UA: 5 (ref 5.0–7.5)

## 2022-09-23 LAB — BLADDER SCAN AMB NON-IMAGING: Scan Result: 41

## 2022-09-23 MED ORDER — GEMTESA 75 MG PO TABS: 75.0000 mg | ORAL_TABLET | Freq: Every day | ORAL | 0 refills | Status: DC

## 2022-09-23 NOTE — Patient Instructions (Signed)
Need urine to be around pH of 7.0. Currently 5. Diet recommendations for increasing urine pH.

## 2022-09-23 NOTE — Progress Notes (Signed)
2:27 PM post void residual =41

## 2022-09-23 NOTE — Progress Notes (Signed)
post void residual =49m

## 2022-09-23 NOTE — Progress Notes (Signed)
Assessment: 1. Burning with urination  2. Benign prostatic hyperplasia with nocturia - Urinalysis, Routine w reflex microscopic  3. Prostate cancer ([place]) - PSA; Future  4. Nocturia  5. Elevated PSA - BLADDER SCAN AMB NON-IMAGING - PSA; Future  6. History of UTI    Plan: Trial of Gemtesa for OAB sxs and keep FU in January for recheck PSA post brachytx placement on 09/03/22.  He will continue silodosin BID as previously instructed. If sxs worsen prior to FU, he will return for eval.  Meds ordered this encounter  Medications   Vibegron (GEMTESA) 75 MG TABS    Sig: Take 75 mg by mouth daily.    Dispense:  28 tablet    Refill:  0     Chief Complaint: No chief complaint on file.   HPI: Sergio Hughes is a 81 y.o. male who presents for continued evaluation of new onset urinary urgency and nocturia reported as voiding every hour post brachytherapy/seed placement on 09/03/22.  Also complains of persistent burning at the end of his penis throughout each void.  He has occasional weakness of stream but denies straining to void.  He sometimes feels that he is not completely empty.  He denies gross hematuria, dysuria, abdominal pain.  No fever, chills, nausea vomiting.  UA=clear PVR=50m IPSS=   25        QOL=5 GFR 26.33 on 10/23/21 Portions of the above documentation were copied from a prior visit for review purposes only.  Allergies: Allergies  Allergen Reactions   Bupropion Hcl     REACTION: extreme dizziness   Lovastatin     myalgias   Uroxatral [Alfuzosin]     Weakness, tinnitus    PMH: Past Medical History:  Diagnosis Date   Bilateral carotid bruits    uKoreacarotid bilateral 05-14-2022 epic   Chronic kidney disease stage 4    Coronary artery calcification seen on CT scan    Hyperlipidemia    Hypertension    per pt on 08-25-2022 off amlodipine last 3 to 4 months per nephrology dr cLouanna Raw  Osteoarthritis    Prostate cancer ([facility]    RBBB     PSH: Past  Surgical History:  Procedure Laterality Date   COLONOSCOPY     CYSTOSCOPY N/A 09/03/2022   Procedure: CYSTOSCOPY;  Surgeon: MCleon Gustin MD;  [facility]  Service: Urology;  Laterality: N/A;   INGUINAL HERNIA REPAIR  2005   Right   LESION REMOVAL Left 09/23/2020   Procedure: MINOR EXICISION OF LESION OF LEFT EAR;  Surgeon: RIzora Gala MD;  [place]  Service: ENT;  Laterality: Left;   PARTIAL COLECTOMY Right 12/16/2015   Procedure: PARTIAL CECECTOMY AND APPENDECTOMY;  Surgeon: JJudeth Horn MD;  [place]  Service: General;  Laterality: Right;   POLYPECTOMY     RADIOACTIVE SEED IMPLANT N/A 09/03/2022   Procedure: RADIOACTIVE SEED IMPLANT/BRACHYTHERAPY IMPLANT;  Surgeon: MCleon Gustin MD;  [facility]  Service: Urology;  Laterality: N/A;   SPACE OAR INSTILLATION N/A 09/03/2022   Procedure: SPACE OAR INSTILLATION;  Surgeon: MCleon Gustin MD;  [facility]  Service: Urology;  Laterality: N/A;   VASECTOMY      SH: Social History   Tobacco Use   Smoking status: Former    Packs/day: 0.50    Years: 10.00    Total pack years: 5.00    Types: Cigarettes    Quit  date: 12/14/1965    Years since quitting: 56.8   Smokeless tobacco: Never  Vaping Use   Vaping Use: Never used  Substance Use Topics   Alcohol use: Not Currently   Drug use: No    ROS: All other review of systems were reviewed and are negative except what is noted above in HPI  PE: BP 118/66   Pulse 76   Wt 138 lb (62.6 kg)   BMI 22.96 kg/m  GENERAL APPEARANCE:  Well appearing, well developed, well nourished, NAD HEENT:  Atraumatic, normocephalic NECK:  Supple. Trachea midline ABDOMEN:  Soft, non-tender, no masses EXTREMITIES:  Moves all extremities well, without clubbing, cyanosis, or edema NEUROLOGIC:  Alert and oriented x 3, normal gait, CN II-XII grossly intact MENTAL STATUS:   appropriate BACK:  Non-tender to palpation, No CVAT SKIN:  Warm, dry, and intact   Results: Laboratory Data: Lab Results  Component Value Date   WBC 6.1 08/28/2022   HGB 11.9 (L) 09/03/2022   HCT 35.0 (L) 09/03/2022   MCV 90 08/28/2022   PLT 166 08/28/2022    Lab Results  Component Value Date   CREATININE 3.50 (H) 09/03/2022    Lab Results  Component Value Date   PSA 11.03 (H) 10/23/2021   PSA 6.35 (H) 09/29/2017   PSA 6.51 (H) 05/24/2017    No results found for: "TESTOSTERONE"  Lab Results  Component Value Date   HGBA1C 5.8 (H) 12/09/2015    Urinalysis    Component Value Date/Time   COLORURINE YELLOW 09/14/2022 1410   APPEARANCEUR Cloudy (A) 09/14/2022 1410   APPEARANCEUR Clear 06/12/2022 1229   LABSPEC 1.010 09/14/2022 1410   PHURINE 5.5 09/14/2022 1410   GLUCOSEU NEGATIVE 09/14/2022 1410   HGBUR MODERATE (A) 09/14/2022 1410   BILIRUBINUR NEGATIVE 09/14/2022 1410   BILIRUBINUR Negative 06/12/2022 1229   KETONESUR NEGATIVE 09/14/2022 1410   PROTEINUR 2+ (A) 06/12/2022 1229   UROBILINOGEN 0.2 09/14/2022 1410   NITRITE POSITIVE (A) 09/14/2022 1410   LEUKOCYTESUR LARGE (A) 09/14/2022 1410    Lab Results  Component Value Date   LABMICR See below: 06/12/2022   WBCUA None seen 06/12/2022   LABEPIT None seen 06/12/2022   MUCUS Present 06/12/2022   BACTERIA Many(>50/hpf) (A) 09/14/2022    Pertinent Imaging: No results found for this or any previous visit.  No results found for this or any previous visit.  No results found for this or any previous visit.  No results found for this or any previous visit.  Results for orders placed during the hospital encounter of 05/14/22  US RENAL  Narrative CLINICAL DATA:  Chronic renal disease  EXAM: RENAL / URINARY TRACT ULTRASOUND COMPLETE  COMPARISON:  Renal ultrasound 05/10/2020  FINDINGS: Right Kidney:  Renal measurements: 8.5 x 4.0 x 3.8 cm = volume: 67.2 mL. Renal cortical thinning. Increased  cortical echogenicity. No hydronephrosis.  Left Kidney:  Renal measurements: 9.5 x 4.6 x 4.2 cm = volume: 94.2 mL. Renal cortical thinning. Increased renal cortical echogenicity. No hydronephrosis. Probable 3 mm stone inferior pole.  Bladder:  Appears normal for degree of bladder distention.  Other:  None.  IMPRESSION: No hydronephrosis.  Increased cortical echogenicity compatible with chronic medical renal disease.   Electronically Signed By: Lovey Newcomer M.D. On: 05/14/2022 15:05  No valid procedures specified. No results found for this or any previous visit.  No results found for this or any previous visit.  No results found for this or any previous visit (from the past 24  hour(s)).

## 2022-09-25 ENCOUNTER — Ambulatory Visit: Payer: Medicare HMO | Admitting: Urology

## 2022-09-25 ENCOUNTER — Ambulatory Visit: Payer: Self-pay | Admitting: Urology

## 2022-09-25 ENCOUNTER — Ambulatory Visit: Payer: Medicare HMO | Admitting: Radiation Oncology

## 2022-09-29 ENCOUNTER — Telehealth: Payer: Self-pay | Admitting: *Deleted

## 2022-09-29 ENCOUNTER — Encounter: Payer: Medicare HMO | Attending: Nephrology | Admitting: Skilled Nursing Facility1

## 2022-09-29 ENCOUNTER — Encounter: Payer: Self-pay | Admitting: Skilled Nursing Facility1

## 2022-09-29 VITALS — Ht 65.0 in | Wt 138.3 lb

## 2022-09-29 DIAGNOSIS — Z8546 Personal history of malignant neoplasm of prostate: Secondary | ICD-10-CM | POA: Insufficient documentation

## 2022-09-29 DIAGNOSIS — N184 Chronic kidney disease, stage 4 (severe): Secondary | ICD-10-CM | POA: Diagnosis not present

## 2022-09-29 DIAGNOSIS — Z713 Dietary counseling and surveillance: Secondary | ICD-10-CM | POA: Insufficient documentation

## 2022-09-29 DIAGNOSIS — N189 Chronic kidney disease, unspecified: Secondary | ICD-10-CM | POA: Insufficient documentation

## 2022-09-29 DIAGNOSIS — E785 Hyperlipidemia, unspecified: Secondary | ICD-10-CM | POA: Insufficient documentation

## 2022-09-29 NOTE — Telephone Encounter (Signed)
Called patient to remind of post seed appts. for 10-01-22, spoke with patient and he is aware of these appts.

## 2022-09-29 NOTE — Progress Notes (Unsigned)
Medical Nutrition Therapy  Appointment Start time:  ***  Appointment End time:  ***  Primary concerns today: wants diet to help with his CKD.  Referral diagnosis: *** Preferred learning style: *** (auditory, visual, hands on, no preference indicated) Learning readiness: *** (not ready, contemplating, ready, change in progress)   NUTRITION ASSESSMENT    Clinical Medical Hx: arhtris, prostate cancer, Hyperlipidemia, CKD stage 4 Medications: see list  Labs: BUN 65, GFR 20, creatinine 3.09, phosphorus 3.3 Notable Signs/Symptoms: pt reports burning during urination   Lifestyle & Dietary Hx  Pt sates he was recently taken off his bp medicine and aspirin his bp was running lower.  Pt states he has made changes to his diet to tighten it up.    Pt tsates he would like to talk about CKD what are the big no ons, caregiver o his wife so his diet is his wifes diet, seed implant burning when urinating (did take an antibiotic). Pt states he has a good appetite.    Estimated daily fluid intake: 50 oz Supplements: vitmain D, fish oil, b12 Sleep: wakes up every hour to use the restroom, falls back asleep fairly quickly  Stress / self-care: none reported Current average weekly physical activity:  24-Hr Dietary Recall First Meal: oatmeal + peanut butter + blueberries + cinnamon + soymilk Snack: nuts Second Meal: 2 egg whites + salad + celery + peppers + tomatoes + lite dressing + triscuits + guacamole  or hummus Snack:  Third Meal: chicken and salad and green beans +  Snack: (once a week) skinny pop popcorn with tea Beverages: 30 ounces green tea and lemon ginger tea, 20 ounces soy milk,   Estimated Energy Needs Calories: *** Carbohydrate: ***g Protein: ***g Fat: ***g   NUTRITION DIAGNOSIS  {CHL AMB NUTRITIONAL DIAGNOSIS:[phone]}   NUTRITION INTERVENTION  Nutrition education (E-1) on the following topics:  Gave information on how to add more protein to diet. Educated him on  the specific foods in question (granola, beans, etc.) and how they relate to CKD. Talked about introducing plain water into his diet along with his tea and soy milk.  Handouts Provided Include  Plant based protein ideas to increase protein in diet.  Learning Style & Readiness for Change Teaching method utilized: Visual & Auditory  Demonstrated degree of understanding via: Teach Back  Barriers to learning/adherence to lifestyle change: none  Goals Established by Pt Try replacing 10 ounces of your tea with plain water   MONITORING & EVALUATION Dietary intake, weekly physical activity, and *** in ***.  Next Steps  Patient is to ***.

## 2022-10-01 ENCOUNTER — Ambulatory Visit
Admission: RE | Admit: 2022-10-01 | Discharge: 2022-10-01 | Disposition: A | Discharge status: Home / Self Care | Payer: Medicare HMO | Source: Ambulatory Visit | Attending: Radiation Oncology | Admitting: Radiation Oncology

## 2022-10-01 ENCOUNTER — Ambulatory Visit
Admission: RE | Admit: 2022-10-01 | Discharge: 2022-10-01 | Disposition: A | Discharge status: Home / Self Care | Payer: Medicare HMO | Source: Ambulatory Visit | Attending: Urology | Admitting: Urology

## 2022-10-01 ENCOUNTER — Encounter: Payer: Self-pay | Admitting: Radiation Oncology

## 2022-10-01 ENCOUNTER — Other Ambulatory Visit: Payer: Self-pay

## 2022-10-01 VITALS — BP 122/65 | HR 74 | Temp 97.7°F | Resp 20 | Ht 65.0 in | Wt 137.8 lb

## 2022-10-01 DIAGNOSIS — Z79899 Other long term (current) drug therapy: Secondary | ICD-10-CM | POA: Diagnosis not present

## 2022-10-01 DIAGNOSIS — Z7982 Long term (current) use of aspirin: Secondary | ICD-10-CM | POA: Diagnosis not present

## 2022-10-01 DIAGNOSIS — Z923 Personal history of irradiation: Secondary | ICD-10-CM | POA: Insufficient documentation

## 2022-10-01 DIAGNOSIS — C61 Malignant neoplasm of prostate: Secondary | ICD-10-CM | POA: Insufficient documentation

## 2022-10-01 NOTE — Progress Notes (Signed)
  Radiation Oncology         (336) [phone] ________________________________  Name: Sergio Hughes MRN: [id number]  Date: 10/01/2022  DOB: [birth year]  COMPLEX SIMULATION NOTE  NARRATIVE:  The patient was brought to the [facility] today following prostate seed implantation approximately one month ago.  Identity was confirmed.  All relevant records and images related to the planned course of therapy were reviewed.  Then, the patient was set-up supine.  CT images were obtained.  The CT images were loaded into the planning software.  Then the prostate and rectum were contoured.  Treatment planning then occurred.  The implanted iodine 125 seeds were identified by the physics staff for projection of radiation distribution  I have requested : 3D Simulation  I have requested a DVH of the following structures: Prostate and rectum.    ________________________________  Sheral Apley Tammi Klippel, M.D.

## 2022-10-01 NOTE — [place] Notes (Signed)
Post-seed nursing interview for an 81 y.o. gentleman with Stage T1c adenocarcinoma of the prostate with Gleason score of 3+4, and PSA of 11.9.  I verified patient's identity and began nursing interview. Patient reports groin discomfort (pressure) and dysuria 8/10. No other issues reported at this time.  Meaningful use complete. I-PSS score of 28-severe. Rapaflo as directed. Urology appt- Feb 7th, 2023 at [facility] [place] w/ Dr. Nicolette Bang  BP 122/65 (BP Location: Left Arm, Patient Position: Sitting, Cuff Size: Normal)   Pulse 74   Temp 97.7 F (36.5 C) (Temporal)   Resp 20   Ht '5\' 5"'$  (1.651 m)   Wt 137 lb 12.8 oz (62.5 kg)   SpO2 100%   BMI 22.93 kg/m   This concludes this nursing interview.  Leandra Kern, LPN

## 2022-10-01 NOTE — [place] Notes (Signed)
Radiation Oncology         (336) [phone] ________________________________  Name: Sergio Hughes MRN: [id number]  Date: 10/01/2022  DOB: [birth year]  Follow-Up Visit Note  CC: Plotnikov, [name], MD  Alyson Ingles Candee Furbish, MD  Diagnosis:   81 y.o. gentleman with Stage T1c adenocarcinoma of the prostate with Gleason score of 3+4, and PSA of 11.9.    ICD-10-CM   1. Malignant neoplasm of prostate ([place])  C61       Interval Since Last Radiation:  4 weeks s/p seed implant 09/03/22 to 145 Gy  Narrative:  The patient returns today for routine follow-up.  He is complaining of increased urinary frequency and urinary hesitation symptoms. He filled out a questionnaire regarding urinary function today providing and overall IPSS score of 28 characterizing his symptoms as severe.  His pre-implant score was 15. He denies any bowel symptoms.  ALLERGIES:  is allergic to bupropion hcl, lovastatin, and uroxatral [alfuzosin].  Meds: Current Outpatient Medications  Medication Sig Dispense Refill   aspirin 81 MG EC tablet Take 81 mg by mouth daily.     Cholecalciferol (VITAMIN D) 2000 UNITS CAPS Take 1 capsule by mouth daily.     famotidine (PEPCID) 40 MG tablet Take 1 tablet (40 mg total) by mouth daily. (Patient taking differently: Take 40 mg by mouth as needed.) 30 tablet 3   Glucosamine-Chondroit-Vit C-Mn (GLUCOSAMINE 1500 COMPLEX) CAPS Take by mouth daily at 2 PM.     loratadine (CLARITIN) 10 MG tablet Take 1 tablet (10 mg total) by mouth daily. (Patient taking differently: Take 10 mg by mouth daily as needed.) 30 tablet 3   Omega-3 Fatty Acids (FISH OIL) 1000 MG CAPS 1,200 mg daily at 2 PM.     pravastatin (PRAVACHOL) 20 MG tablet TAKE 1 TABLET EVERY DAY 90 tablet 3   silodosin (RAPAFLO) 8 MG CAPS capsule Take 1 capsule (8 mg total) by mouth at bedtime. 30 capsule 11   traMADol (ULTRAM) 50 MG tablet Take 1 tablet (50 mg total) by mouth every 6 (six) hours as needed. 15 tablet 0   Vibegron (GEMTESA) 75  MG TABS Take 75 mg by mouth daily. 28 tablet 0   No current facility-administered medications for this visit.    Physical Findings: The patient is in no acute distress. Patient is alert and oriented.  vitals were not taken for this visit. .  No significant changes.  Lab Findings: Lab Results  Component Value Date   WBC 6.1 08/28/2022   HGB 11.9 (L) 09/03/2022   HCT 35.0 (L) 09/03/2022   MCV 90 08/28/2022   PLT 166 08/28/2022    Radiographic Findings:  Patient underwent CT imaging in our clinic for post implant dosimetry. The CT appears to demonstrate an adequate distribution of radioactive seeds throughout the prostate gland. There no seeds in her near the rectum. I suspect the final radiation plan and dosimetry will show appropriate coverage of the prostate gland.   Impression: The patient is recovering from the effects of radiation. His urinary symptoms should gradually improve over the next 4-6 months. We talked about this today. He is encouraged by his improvement already and is otherwise please with his outcome.   Plan: Today, I spent time talking to the patient about his prostate seed implant and resolving urinary symptoms. We also talked about long-term follow-up for prostate cancer following seed implant. He understands that ongoing PSA determinations and digital rectal exams will help perform surveillance to rule out disease recurrence.  He understands what to expect with his PSA measures. Patient was also educated today about some of the long-term effects from radiation including a small risk for rectal bleeding and possibly erectile dysfunction. We talked about some of the general management approaches to these potential complications. However, I did encourage the patient to contact our office or return at any point if he has questions or concerns related to his previous radiation and prostate cancer.  _____________________________________  Sheral Apley. Tammi Klippel, M.D.

## 2022-10-06 ENCOUNTER — Encounter: Payer: Self-pay | Admitting: Radiation Oncology

## 2022-10-06 DIAGNOSIS — C61 Malignant neoplasm of prostate: Secondary | ICD-10-CM | POA: Diagnosis not present

## 2022-10-06 DIAGNOSIS — Z191 Hormone sensitive malignancy status: Secondary | ICD-10-CM | POA: Diagnosis not present

## 2022-10-06 NOTE — [place] Notes (Signed)
  Radiation Oncology         (336) [phone] ________________________________  Name: ARKEL CARTWRIGHT MRN: [id number]  Date: 10/06/2022  DOB: Dec 16, 1940  3D Planning Note   Prostate Brachytherapy Post-Implant Dosimetry  Diagnosis: 81 y.o. gentleman with Stage T1c adenocarcinoma of the prostate with Gleason score of 3+4, and PSA of 11.9.  Narrative: On a previous date, BRANDELL MAREADY returned following prostate seed implantation for post implant planning. He underwent CT scan complex simulation to delineate the three-dimensional structures of the pelvis and demonstrate the radiation distribution.  Since that time, the seed localization, and complex isodose planning with dose volume histograms have now been completed.  Results:   Prostate Coverage - The dose of radiation delivered to the 90% or more of the prostate gland (D90) was 115.24% of the prescription dose. This exceeds our goal of greater than 90%. Rectal Sparing - The volume of rectal tissue receiving the prescription dose or higher was 0.23 cc. This falls under our thresholds tolerance of 1.0 cc.  Impression: The prostate seed implant appears to show adequate target coverage and appropriate rectal sparing.  Plan:  The patient will continue to follow with urology for ongoing PSA determinations. I would anticipate a high likelihood for local tumor control with minimal risk for rectal morbidity.  ________________________________  Sheral Apley Tammi Klippel, M.D.

## 2022-10-29 ENCOUNTER — Ambulatory Visit: Payer: Medicare HMO | Admitting: Internal Medicine

## 2022-11-02 DIAGNOSIS — N184 Chronic kidney disease, stage 4 (severe): Secondary | ICD-10-CM | POA: Diagnosis not present

## 2022-11-02 DIAGNOSIS — D631 Anemia in chronic kidney disease: Secondary | ICD-10-CM | POA: Diagnosis not present

## 2022-11-02 DIAGNOSIS — I129 Hypertensive chronic kidney disease with stage 1 through stage 4 chronic kidney disease, or unspecified chronic kidney disease: Secondary | ICD-10-CM | POA: Diagnosis not present

## 2022-11-02 DIAGNOSIS — N189 Chronic kidney disease, unspecified: Secondary | ICD-10-CM | POA: Diagnosis not present

## 2022-11-02 DIAGNOSIS — R809 Proteinuria, unspecified: Secondary | ICD-10-CM | POA: Diagnosis not present

## 2022-11-02 DIAGNOSIS — N2581 Secondary hyperparathyroidism of renal origin: Secondary | ICD-10-CM | POA: Diagnosis not present

## 2022-11-02 LAB — COMPREHENSIVE METABOLIC PANEL
Albumin: 4.1 (ref 3.5–5.0)
Calcium: 9.3 (ref 8.7–10.7)
eGFR: 20

## 2022-11-02 LAB — IRON,TIBC AND FERRITIN PANEL
%SAT: 21
Ferritin: 136
Iron: 68
TIBC: 319
UIBC: 251

## 2022-11-02 LAB — BASIC METABOLIC PANEL
BUN: 81 — AB (ref 4–21)
CO2: 24 — AB (ref 13–22)
Chloride: 101 (ref 99–108)
Creatinine: 3 — AB (ref 0.6–1.3)
Glucose: 121
Potassium: 5 mEq/L (ref 3.5–5.1)
Sodium: 138 (ref 137–147)

## 2022-11-02 LAB — CBC AND DIFFERENTIAL
HCT: 33 — AB (ref 41–53)
Hemoglobin: 11.1 — AB (ref 13.5–17.5)
Platelets: 242 10*3/uL (ref 150–400)
WBC: 8.2

## 2022-11-02 LAB — PROTEIN / CREATININE RATIO, URINE: Creatinine, Urine: 81.7

## 2022-11-02 LAB — MICROALBUMIN / CREATININE URINE RATIO: Microalb Creat Ratio: 349

## 2022-11-02 LAB — VITAMIN D 25 HYDROXY (VIT D DEFICIENCY, FRACTURES): Vit D, 25-Hydroxy: 63.2

## 2022-11-02 LAB — CBC: RBC: 3.71 — AB (ref 3.87–5.11)

## 2022-11-09 ENCOUNTER — Encounter: Payer: Self-pay | Admitting: Urology

## 2022-11-10 NOTE — Telephone Encounter (Signed)
Please see patient request for medication change

## 2022-11-11 ENCOUNTER — Encounter: Payer: Self-pay | Admitting: Internal Medicine

## 2022-11-30 ENCOUNTER — Telehealth: Payer: Self-pay

## 2022-11-30 MED ORDER — TAMSULOSIN HCL 0.4 MG PO CAPS: 0.4000 mg | ORAL_CAPSULE | Freq: Two times a day (BID) | ORAL | 11 refills | Status: DC

## 2022-11-30 NOTE — Telephone Encounter (Signed)
Patient is requesting Flomax to be escribed to [facility] on [address].  Thanks, Helene Kelp

## 2022-11-30 NOTE — Telephone Encounter (Signed)
Made patient aware that his prescription was sent to [facility]. Patient voiced understanding.

## 2022-12-02 ENCOUNTER — Encounter: Payer: Self-pay | Admitting: Urology

## 2022-12-03 ENCOUNTER — Other Ambulatory Visit: Payer: Self-pay

## 2022-12-03 MED ORDER — TAMSULOSIN HCL 0.4 MG PO CAPS: 0.4000 mg | ORAL_CAPSULE | Freq: Two times a day (BID) | ORAL | 11 refills | Status: DC

## 2022-12-09 ENCOUNTER — Encounter: Payer: Self-pay | Admitting: Vascular Surgery

## 2022-12-09 ENCOUNTER — Ambulatory Visit: Payer: Medicare HMO | Admitting: Vascular Surgery

## 2022-12-09 VITALS — BP 122/71 | HR 65 | Temp 97.5°F | Ht 65.0 in | Wt 140.6 lb

## 2022-12-09 DIAGNOSIS — N184 Chronic kidney disease, stage 4 (severe): Secondary | ICD-10-CM

## 2022-12-09 NOTE — Progress Notes (Signed)
[facility] [place]  Patient name: Sergio Hughes MRN: [id number] DOB: 1941-08-11 Sex: male  REASON FOR CONSULT: Discuss access for hemodialysis  Nephrologist: [place]  HPI: Sergio Hughes is a 82 y.o. male, who is here today for discussion of access options for hemodialysis.  He is stage IV.  His creatinine is in the 3.0-3.5 range.  He has done some reading regarding options for hemodialysis and currently is leaning towards peritoneal dialysis.  He does report a prior partial hemicolectomy years ago but does not have any other abdominal surgery.  He does have prostate cancer.  He is right-handed.  He does not have a pacemaker.  He is not on anticoagulation.  Past Medical History:  Diagnosis Date   Bilateral carotid bruits    US carotid bilateral 05-14-2022 epic   Chronic kidney disease stage 4    Coronary artery calcification seen on CT scan    Hyperlipidemia    Hypertension    per pt on 08-25-2022 off amlodipine last 3 to 4 months per nephrology dr Louanna Raw   Osteoarthritis    Prostate cancer [facility])    RBBB     Family History  Problem Relation Age of Onset   Hypertension Father    Cancer Father        brain ca   Prostate cancer Brother 67   Colon cancer Neg Hx    Rectal cancer Neg Hx    Stomach cancer Neg Hx    Esophageal cancer Neg Hx     SOCIAL HISTORY: Social History   Socioeconomic History   Marital status: Married    Spouse name: Sergio Hughes   Number of children: Not on file   Years of education: college    Highest education level: Not on file  Occupational History   Occupation: retired  Tobacco Use   Smoking status: Former    Packs/day: 0.50    Years: 10.00    Total pack years: 5.00    Types: Cigarettes    Quit date: 12/14/1965    Years since quitting: 57.0   Smokeless tobacco: Never  Vaping Use   Vaping Use: Never used  Substance and Sexual Activity   Alcohol use: Not Currently   Drug use:  No   Sexual activity: Not Currently  Other Topics Concern   Not on file  Social History Narrative   Regular exercise - YES - golf   Social Determinants of Health   Financial Resource Strain: Low Risk  (12/12/2021)   Overall Financial Resource Strain (CARDIA)    Difficulty of Paying Living Expenses: Not hard at all  Food Insecurity: No Food Insecurity (12/12/2021)   Hunger Vital Sign    Worried About Running Out of Food in the Last Year: Never true    [place] in the Last Year: Never true  Transportation Needs: No Transportation Needs (12/12/2021)   PRAPARE - Hydrologist (Medical): No    Lack of Transportation (Non-Medical): No  Physical Activity: Insufficiently Active (12/12/2021)   Exercise Vital Sign    Days of Exercise per Week: 3 days    Minutes of Exercise per Session: 30 min  Stress: No Stress Concern Present (12/12/2021)   [place]    Feeling of Stress : Not at all  Social Connections: Moderately Integrated (12/12/2021)   Social Connection and Isolation Panel [NHANES]    Frequency of Communication with Friends  and Family: Three times a week    Frequency of Social Gatherings with Friends and Family: Three times a week    Attends Religious Services: Never    Active Member of Clubs or Organizations: Yes    Attends Archivist Meetings: More than 4 times per year    Marital Status: Married  Human resources officer Violence: Not At Risk (12/12/2021)   Humiliation, Afraid, Rape, and Kick questionnaire    Fear of Current or Ex-Partner: No    Emotionally Abused: No    Physically Abused: No    Sexually Abused: No    Allergies  Allergen Reactions   Bupropion Hcl     REACTION: extreme dizziness   Lovastatin     myalgias   Uroxatral [Alfuzosin]     Weakness, tinnitus    Current Outpatient Medications  Medication Sig Dispense Refill   Cholecalciferol (VITAMIN D) 2000  UNITS CAPS Take 1 capsule by mouth daily.     Glucosamine-Chondroit-Vit C-Mn (GLUCOSAMINE 1500 COMPLEX) CAPS Take by mouth daily at 2 PM.     Omega-3 Fatty Acids (FISH OIL) 1000 MG CAPS 1,200 mg daily at 2 PM.     pravastatin (PRAVACHOL) 20 MG tablet TAKE 1 TABLET EVERY DAY 90 tablet 3   tamsulosin (FLOMAX) 0.4 MG CAPS capsule Take 1 capsule (0.4 mg total) by mouth 2 (two) times daily. 60 capsule 11   aspirin 81 MG EC tablet Take 81 mg by mouth daily. (Patient not taking: Reported on 12/09/2022)     famotidine (PEPCID) 40 MG tablet Take 1 tablet (40 mg total) by mouth daily. (Patient not taking: Reported on 12/09/2022) 30 tablet 3   loratadine (CLARITIN) 10 MG tablet Take 1 tablet (10 mg total) by mouth daily. (Patient not taking: Reported on 12/09/2022) 30 tablet 3   silodosin (RAPAFLO) 8 MG CAPS capsule Take 1 capsule (8 mg total) by mouth at bedtime. (Patient not taking: Reported on 12/09/2022) 30 capsule 11   traMADol (ULTRAM) 50 MG tablet Take 1 tablet (50 mg total) by mouth every 6 (six) hours as needed. (Patient not taking: Reported on 12/09/2022) 15 tablet 0   Vibegron (GEMTESA) 75 MG TABS Take 75 mg by mouth daily. (Patient not taking: Reported on 12/09/2022) 28 tablet 0   No current facility-administered medications for this visit.    REVIEW OF SYSTEMS:  '[X]'$  denotes positive finding, '[ ]'$  denotes negative finding Cardiac  Comments:  Chest pain or chest pressure:    Shortness of breath upon exertion:    Short of breath when lying flat:    Irregular heart rhythm:        Vascular    Pain in calf, thigh, or hip brought on by ambulation:    Pain in feet at night that wakes you up from your sleep:     Blood clot in your veins:    Leg swelling:         Pulmonary    Oxygen at home:    Productive cough:     Wheezing:         Neurologic    Sudden weakness in arms or legs:     Sudden numbness in arms or legs:     Sudden onset of difficulty speaking or slurred speech:    Temporary loss  of vision in one eye:     Problems with dizziness:         Gastrointestinal    Blood in stool:     Vomited blood:  Genitourinary    Burning when urinating:  x   Blood in urine:        Psychiatric    Major depression:         Hematologic    Bleeding problems:    Problems with blood clotting too easily:        Skin    Rashes or ulcers:        Constitutional    Fever or chills:      PHYSICAL EXAM: Vitals:   12/09/22 1410  BP: 122/71  Pulse: 65  Temp: (!) 97.5 F (36.4 C)  SpO2: 100%  Weight: 140 lb 9.6 oz (63.8 kg)  Height: '5\' 5"'$  (1.651 m)    GENERAL: The patient is a well-nourished male, in no acute distress. The vital signs are documented above. CARDIOVASCULAR: 2+ radial and 2+ brachial pulses bilaterally.  Small surface veins bilaterally. PULMONARY: There is good air exchange  MUSCULOSKELETAL: There are no major deformities or cyanosis. NEUROLOGIC: No focal weakness or paresthesias are detected. SKIN: There are no ulcers or rashes noted. PSYCHIATRIC: The patient has a normal affect.  DATA:  I imaged his veins in both arms with SonoSite ultrasound.  He does have small cephalic and basilic veins bilaterally.  MEDICAL ISSUES: Had long discussion with patient regarding options for hemodialysis.  I discussed tunneled hemodialysis catheter for acute temporary use and also AV fistula and AV graft placement.  He does not appear to be a candidate for AV fistula attempt due to very small surface veins bilaterally.  I would recommend a left upper arm AV Gore-Tex graft as his initial dialysis option for hemodialysis.  He is leaning towards peritoneal dialysis.  I would reserve graft placement until near time for needed dialysis.  I explained to him the maintenance required with clotting of Gore-Tex graft.  He will continue his discussion regarding timing of dialysis and PD versus hemodialysis with Dr. Marval Regal.  We will see him again on an as-needed basis   Rosetta Posner, MD [facility] [place] [facility] [phone] Pager [phone]  Note: Portions of this report may have been transcribed using voice recognition software.  Every effort has been made to ensure accuracy; however, inadvertent computerized transcription errors may still be present.

## 2022-12-16 ENCOUNTER — Ambulatory Visit (INDEPENDENT_AMBULATORY_CARE_PROVIDER_SITE_OTHER): Payer: Medicare HMO | Admitting: Internal Medicine

## 2022-12-16 ENCOUNTER — Encounter: Payer: Self-pay | Admitting: Internal Medicine

## 2022-12-16 VITALS — BP 102/68 | HR 62 | Temp 98.2°F | Ht 65.0 in | Wt 140.0 lb

## 2022-12-16 DIAGNOSIS — K219 Gastro-esophageal reflux disease without esophagitis: Secondary | ICD-10-CM

## 2022-12-16 DIAGNOSIS — E785 Hyperlipidemia, unspecified: Secondary | ICD-10-CM | POA: Diagnosis not present

## 2022-12-16 DIAGNOSIS — M159 Polyosteoarthritis, unspecified: Secondary | ICD-10-CM | POA: Diagnosis not present

## 2022-12-16 DIAGNOSIS — N62 Hypertrophy of breast: Secondary | ICD-10-CM | POA: Insufficient documentation

## 2022-12-16 DIAGNOSIS — I2583 Coronary atherosclerosis due to lipid rich plaque: Secondary | ICD-10-CM

## 2022-12-16 DIAGNOSIS — I10 Essential (primary) hypertension: Secondary | ICD-10-CM

## 2022-12-16 DIAGNOSIS — I2510 Atherosclerotic heart disease of native coronary artery without angina pectoris: Secondary | ICD-10-CM | POA: Diagnosis not present

## 2022-12-16 DIAGNOSIS — R972 Elevated prostate specific antigen [PSA]: Secondary | ICD-10-CM | POA: Diagnosis not present

## 2022-12-16 LAB — BASIC METABOLIC PANEL
BUN: 78 mg/dL — ABNORMAL HIGH (ref 6–23)
CO2: 27 mEq/L (ref 19–32)
Calcium: 9.1 mg/dL (ref 8.4–10.5)
Chloride: 102 mEq/L (ref 96–112)
Creatinine, Ser: 3.09 mg/dL — ABNORMAL HIGH (ref 0.40–1.50)
GFR: 18.23 mL/min — ABNORMAL LOW (ref >60.00)
Glucose, Bld: 128 mg/dL — ABNORMAL HIGH (ref 70–99)
Potassium: 4.5 mEq/L (ref 3.5–5.1)
Sodium: 136 mEq/L (ref 135–145)

## 2022-12-16 MED ORDER — TRIAMCINOLONE ACETONIDE 0.1 % EX CREA: 1.0000 | TOPICAL_CREAM | Freq: Two times a day (BID) | CUTANEOUS | 3 refills | Status: DC

## 2022-12-16 NOTE — Assessment & Plan Note (Signed)
Tramadol prn 

## 2022-12-16 NOTE — Assessment & Plan Note (Signed)
Cont with Pravastatin

## 2022-12-16 NOTE — Assessment & Plan Note (Signed)
Mild B - ?tamsulosyn related Will watch Mammo suggested

## 2022-12-16 NOTE — Assessment & Plan Note (Signed)
ASA 81 mg/d Cont w/Pravastatin

## 2022-12-16 NOTE — Assessment & Plan Note (Signed)
Started Norvasc

## 2022-12-16 NOTE — Assessment & Plan Note (Signed)
Check PSA. ?

## 2022-12-16 NOTE — Assessment & Plan Note (Signed)
Cont on Pepcid

## 2022-12-16 NOTE — Patient Instructions (Addendum)
Try Magnesium oil spray for cramps  Use aquaphor spray on skin

## 2022-12-16 NOTE — Progress Notes (Signed)
Subjective:  Patient ID: Sergio Hughes, male    DOB: 07-21-1941  Age: 82 y.o. MRN: [id number]  CC: No chief complaint on file.   HPI JADA KUHNERT presents for CAD, dyslipidemia, GERD  C/o L nipple pain x 1 week C/o back itches a lot  Outpatient Medications Prior to Visit  Medication Sig Dispense Refill   Cholecalciferol (VITAMIN D) 2000 UNITS CAPS Take 1 capsule by mouth daily.     Glucosamine-Chondroit-Vit C-Mn (GLUCOSAMINE 1500 COMPLEX) CAPS Take by mouth daily at 2 PM.     Omega-3 Fatty Acids (FISH OIL) 1000 MG CAPS 1,200 mg daily at 2 PM.     pravastatin (PRAVACHOL) 20 MG tablet TAKE 1 TABLET EVERY DAY 90 tablet 3   tamsulosin (FLOMAX) 0.4 MG CAPS capsule Take 1 capsule (0.4 mg total) by mouth 2 (two) times daily. 60 capsule 11   aspirin 81 MG EC tablet Take 81 mg by mouth daily. (Patient not taking: Reported on 12/09/2022)     famotidine (PEPCID) 40 MG tablet Take 1 tablet (40 mg total) by mouth daily. (Patient not taking: Reported on 12/09/2022) 30 tablet 3   loratadine (CLARITIN) 10 MG tablet Take 1 tablet (10 mg total) by mouth daily. (Patient not taking: Reported on 12/09/2022) 30 tablet 3   silodosin (RAPAFLO) 8 MG CAPS capsule Take 1 capsule (8 mg total) by mouth at bedtime. (Patient not taking: Reported on 12/09/2022) 30 capsule 11   traMADol (ULTRAM) 50 MG tablet Take 1 tablet (50 mg total) by mouth every 6 (six) hours as needed. (Patient not taking: Reported on 12/09/2022) 15 tablet 0   Vibegron (GEMTESA) 75 MG TABS Take 75 mg by mouth daily. (Patient not taking: Reported on 12/09/2022) 28 tablet 0   No facility-administered medications prior to visit.    ROS: Review of Systems  Constitutional:  Negative for appetite change, fatigue and unexpected weight change.  HENT:  Negative for congestion, nosebleeds, sneezing, sore throat and trouble swallowing.   Eyes:  Negative for itching and visual disturbance.  Respiratory:  Negative for cough.   Cardiovascular:  Negative for  chest pain, palpitations and leg swelling.  Gastrointestinal:  Negative for abdominal distention, blood in stool, diarrhea and nausea.  Genitourinary:  Negative for frequency and hematuria.  Musculoskeletal:  Positive for arthralgias. Negative for back pain, gait problem, joint swelling and neck pain.  Skin:  Negative for rash.  Neurological:  Negative for dizziness, tremors, speech difficulty and weakness.  Psychiatric/Behavioral:  Negative for agitation, dysphoric mood and sleep disturbance. The patient is not nervous/anxious.     Objective:  BP 102/68 (BP Location: Left Arm, Patient Position: Sitting, Cuff Size: Large)   Pulse 62   Temp 98.2 F (36.8 C) (Oral)   Ht '5\' 5"'$  (1.651 m)   Wt 140 lb (63.5 kg)   SpO2 99%   BMI 23.30 kg/m   BP Readings from Last 3 Encounters:  12/16/22 102/68  12/09/22 122/71  10/01/22 122/65    Wt Readings from Last 3 Encounters:  12/16/22 140 lb (63.5 kg)  12/09/22 140 lb 9.6 oz (63.8 kg)  10/01/22 137 lb 12.8 oz (62.5 kg)    Physical Exam Constitutional:      General: He is not in acute distress.    Appearance: He is well-developed.     Comments: NAD  Eyes:     Conjunctiva/sclera: Conjunctivae normal.     Pupils: Pupils are equal, round, and reactive to light.  Neck:  Thyroid: No thyromegaly.     Vascular: No JVD.  Cardiovascular:     Rate and Rhythm: Normal rate and regular rhythm.     Heart sounds: Normal heart sounds. No murmur heard.    No friction rub. No gallop.  Pulmonary:     Effort: Pulmonary effort is normal. No respiratory distress.     Breath sounds: Normal breath sounds. No wheezing or rales.  Chest:     Chest wall: No tenderness.  Abdominal:     General: Bowel sounds are normal. There is no distension.     Palpations: Abdomen is soft. There is no mass.     Tenderness: There is no abdominal tenderness. There is no guarding or rebound.  Musculoskeletal:        General: No tenderness. Normal range of motion.      Cervical back: Normal range of motion.  Lymphadenopathy:     Cervical: No cervical adenopathy.  Skin:    General: Skin is warm and dry.     Findings: No rash.  Neurological:     Mental Status: He is alert and oriented to person, place, and time.     Cranial Nerves: No cranial nerve deficit.     Motor: No abnormal muscle tone.     Coordination: Coordination normal.     Gait: Gait normal.     Deep Tendon Reflexes: Reflexes are normal and symmetric.  Psychiatric:        Behavior: Behavior normal.        Thought Content: Thought content normal.        Judgment: Judgment normal.   L nipple is sensitive to touch , mild gynecomastia B  Dry skin, SKs   Lab Results  Component Value Date   WBC 8.2 11/02/2022   HGB 11.1 (A) 11/02/2022   HCT 33 (A) 11/02/2022   PLT 242 11/02/2022   GLUCOSE 88 09/03/2022   CHOL 181 10/23/2021   TRIG 76.0 10/23/2021   HDL 82.20 10/23/2021   LDLDIRECT 144.9 11/14/2013   LDLCALC 83 10/23/2021   ALT 15 10/23/2021   AST 22 10/23/2021   NA 138 11/02/2022   K 5.0 11/02/2022   CL 101 11/02/2022   CREATININE 3.0 (A) 11/02/2022   BUN 81 (A) 11/02/2022   CO2 24 (A) 11/02/2022   TSH 0.59 10/23/2021   PSA 11.03 (H) 10/23/2021   INR 1.02 12/09/2015   HGBA1C 5.8 (H) 12/09/2015    No results found.  Assessment & Plan:   Problem List Items Addressed This Visit       Cardiovascular and Mediastinum   CAD (coronary artery disease)    ASA 81 mg/d Cont w/Pravastatin       Essential hypertension    Started Norvasc         Digestive   GERD (gastroesophageal reflux disease) - Primary    Cont on Pepcid        Musculoskeletal and Integument   Osteoarthritis    Tramadol prn        Other   Dyslipidemia    Cont with Pravastatin       Elevated PSA    Check PSA      Gynecomastia    Mild B - ?tamsulosyn related Will watch Mammo suggested         Meds ordered this encounter  Medications   triamcinolone cream (KENALOG) 0.1 %     Sig: Apply 1 Application topically 2 (two) times daily.    Dispense:  450  g    Refill:  3      Follow-up: Return in about 3 months (around 03/17/2023) for a follow-up visit.  Walker Kehr, MD

## 2022-12-17 DIAGNOSIS — N184 Chronic kidney disease, stage 4 (severe): Secondary | ICD-10-CM | POA: Diagnosis not present

## 2022-12-23 ENCOUNTER — Other Ambulatory Visit: Payer: Self-pay | Admitting: Internal Medicine

## 2022-12-24 ENCOUNTER — Ambulatory Visit (INDEPENDENT_AMBULATORY_CARE_PROVIDER_SITE_OTHER): Payer: Medicare HMO

## 2022-12-24 VITALS — Ht 65.0 in | Wt 144.0 lb

## 2022-12-24 DIAGNOSIS — Z0000 Encounter for general adult medical examination without abnormal findings: Secondary | ICD-10-CM

## 2022-12-24 NOTE — Progress Notes (Addendum)
Virtual Visit via Telephone Note  I connected with  Sergio Hughes on 01/12/23 at  3:00 PM EST by telephone and verified that I am speaking with the correct person using two identifiers.  Location: Patient: Home Provider: [place] Persons participating in the virtual visit: [place]   I discussed the limitations, risks, security and privacy concerns of performing an evaluation and management service by telephone and the availability of in person appointments. The patient expressed understanding and agreed to proceed.  Interactive audio and video telecommunications were attempted between this nurse and patient, however failed, due to patient having technical difficulties OR patient did not have access to video capability.  We continued and completed visit with audio only.  Some vital signs may be absent or patient reported.   Sheral Flow, LPN  Subjective:   Sergio Hughes is a 82 y.o. male who presents for Medicare Annual/Subsequent preventive examination.  Review of Systems     Cardiac Risk Factors include: advanced age (>56mn, >>27women);male gender;sedentary lifestyle;dyslipidemia;family history of premature cardiovascular disease     Objective:    Today's Vitals   12/24/22 1508  Weight: 144 lb (65.3 kg)  Height: '5\' 5"'$  (1.651 m)  PainSc: 0-No pain   Body mass index is 23.96 kg/m.     12/24/2022    3:11 PM 10/01/2022    2:58 PM 09/29/2022    2:21 PM 09/03/2022   11:49 AM 07/23/2022    2:03 PM 05/28/2022   12:25 PM 12/12/2021    1:17 PM  Advanced Directives  Does Patient Have a Medical Advance Directive? Yes Yes No Yes Yes Yes Yes  Type of AParamedicof [place] will Living will;Healthcare Power of [place] will Living will;Healthcare Power of [place]  Does patient want to make changes to  medical advance directive?    No - Patient declined     Copy of [place] Chart? No - copy requested   No - copy requested   No - copy requested  Would patient like information on creating a medical advance directive?   No - Patient declined        Current Medications (verified) Outpatient Encounter Medications as of 12/24/2022  Medication Sig   Cholecalciferol (VITAMIN D) 2000 UNITS CAPS Take 1 capsule by mouth daily.   Glucosamine-Chondroit-Vit C-Mn (GLUCOSAMINE 1500 COMPLEX) CAPS Take by mouth daily at 2 PM.   Omega-3 Fatty Acids (FISH OIL) 1000 MG CAPS 1,200 mg daily at 2 PM.   pravastatin (PRAVACHOL) 20 MG tablet TAKE 1 TABLET EVERY DAY   triamcinolone cream (KENALOG) 0.1 % Apply 1 Application topically 2 (two) times daily.   [DISCONTINUED] aspirin 81 MG EC tablet Take 81 mg by mouth daily. (Patient not taking: Reported on 12/09/2022)   [DISCONTINUED] famotidine (PEPCID) 40 MG tablet Take 1 tablet (40 mg total) by mouth daily. (Patient not taking: Reported on 12/09/2022)   [DISCONTINUED] loratadine (CLARITIN) 10 MG tablet Take 1 tablet (10 mg total) by mouth daily. (Patient not taking: Reported on 12/09/2022)   [DISCONTINUED] silodosin (RAPAFLO) 8 MG CAPS capsule Take 1 capsule (8 mg total) by mouth at bedtime. (Patient not taking: Reported on 12/09/2022)   [DISCONTINUED] tamsulosin (FLOMAX) 0.4 MG CAPS capsule Take 1 capsule (0.4 mg total) by mouth 2 (two) times daily.   No facility-administered encounter medications on file as of 12/24/2022.  Allergies (verified) Bupropion hcl, Lovastatin, and Uroxatral [alfuzosin]   History: Past Medical History:  Diagnosis Date   Bilateral carotid bruits    US carotid bilateral 05-14-2022 epic   Chronic kidney disease stage 4    Coronary artery calcification seen on CT scan    Hyperlipidemia    Hypertension    per pt on 08-25-2022 off amlodipine last 3 to 4 months per nephrology dr Louanna Raw   Osteoarthritis    Prostate  cancer [facility])    RBBB    Past Surgical History:  Procedure Laterality Date   COLONOSCOPY     CYSTOSCOPY N/A 09/03/2022   Procedure: CYSTOSCOPY;  Surgeon: Cleon Gustin, MD;  Location: [facility];  Service: Urology;  Laterality: N/A;   INGUINAL HERNIA REPAIR  2005   Right   LESION REMOVAL Left 09/23/2020   Procedure: MINOR EXICISION OF LESION OF LEFT EAR;  Surgeon: Izora Gala, MD;  Location: [place];  Service: ENT;  Laterality: Left;   PARTIAL COLECTOMY Right 12/16/2015   Procedure: PARTIAL CECECTOMY AND APPENDECTOMY;  Surgeon: Judeth Horn, MD;  Location: [place];  Service: General;  Laterality: Right;   POLYPECTOMY     RADIOACTIVE SEED IMPLANT N/A 09/03/2022   Procedure: RADIOACTIVE SEED IMPLANT/BRACHYTHERAPY IMPLANT;  Surgeon: Cleon Gustin, MD;  Location: [facility];  Service: Urology;  Laterality: N/A;   SPACE OAR INSTILLATION N/A 09/03/2022   Procedure: SPACE OAR INSTILLATION;  Surgeon: Cleon Gustin, MD;  Location: [facility];  Service: Urology;  Laterality: N/A;   VASECTOMY     Family History  Problem Relation Age of Onset   Hypertension Father    Cancer Father        brain ca   Prostate cancer Brother 26   Colon cancer Neg Hx    Rectal cancer Neg Hx    Stomach cancer Neg Hx    Esophageal cancer Neg Hx    Social History   Socioeconomic History   Marital status: Married    Spouse name: Shayden Vangorden   Number of children: Not on file   Years of education: college    Highest education level: Not on file  Occupational History   Occupation: retired  Tobacco Use   Smoking status: Former    Packs/day: 0.50    Years: 10.00    Total pack years: 5.00    Types: Cigarettes    Quit date: 12/14/1965    Years since quitting: 67.1   Smokeless tobacco: Never  Vaping Use   Vaping Use: Never used  Substance and Sexual Activity   Alcohol use: Not Currently   Drug use: No   Sexual activity: Not  Currently  Other Topics Concern   Not on file  Social History Narrative   Regular exercise - YES - golf   Social Determinants of Health   Financial Resource Strain: Low Risk  (12/24/2022)   Overall Financial Resource Strain (CARDIA)    Difficulty of Paying Living Expenses: Not hard at all  Food Insecurity: No Food Insecurity (12/24/2022)   Hunger Vital Sign    Worried About Running Out of Food in the Last Year: Never true    [place] in the Last Year: Never true  Transportation Needs: No Transportation Needs (12/24/2022)   PRAPARE - Hydrologist (Medical): No    Lack of Transportation (Non-Medical): No  Physical Activity: Inactive (12/24/2022)   Exercise Vital Sign  Days of Exercise per Week: 0 days    Minutes of Exercise per Session: 0 min  Stress: No Stress Concern Present (12/24/2022)   [place]    Feeling of Stress : Not at all  Social Connections: Moderately Integrated (12/24/2022)   Social Connection and Isolation Panel [NHANES]    Frequency of Communication with Friends and Family: Three times a week    Frequency of Social Gatherings with Friends and Family: Three times a week    Attends Religious Services: Never    Active Member of Clubs or Organizations: Yes    Attends Music therapist: More than 4 times per year    Marital Status: Married    Tobacco Counseling Counseling given: Not Answered   Clinical Intake:  Pre-visit preparation completed: Yes  Pain : No/denies pain Pain Score: 0-No pain     BMI - recorded: 23.96 Nutritional Status: BMI of 19-24  Normal Nutritional Risks: None Diabetes: No  How often do you need to have someone help you when you read instructions, pamphlets, or other written materials from your doctor or pharmacy?: 1 - Never What is the last grade level you completed in school?: HSG  Diabetic? No  Interpreter Needed?:  No  Information entered by :: Lisette Abu, LPN.   Activities of Daily Living    12/24/2022    3:20 PM 09/03/2022   11:57 AM  In your present state of health, do you have any difficulty performing the following activities:  Hearing? 0 0  Vision? 0 0  Difficulty concentrating or making decisions? 0 0  Walking or climbing stairs? 0 0  Dressing or bathing? 0 0  Doing errands, shopping? 0   Preparing Food and eating ? N   Using the Toilet? N   In the past six months, have you accidently leaked urine? N   Do you have problems with loss of bowel control? N   Managing your Medications? N   Managing your Finances? N   Housekeeping or managing your Housekeeping? N     Patient Care Team: Plotnikov, [name], MD as PCP - General Domenic Polite Aloha Gell, MD as PCP - Cardiology (Cardiology) Milus Banister, MD as Attending Physician (Gastroenterology) Satira Sark, MD as Consulting Physician (Cardiology) Rexene Agent, MD as Attending Physician (Nephrology) Katheren Puller, RN as Oncology Nurse Navigator  Indicate any recent Medical Services you may have received from other than [facility] providers in the past year (date may be approximate).     Assessment:   This is a routine wellness examination for Deklin.  Hearing/Vision screen Hearing Screening - Comments:: Denies hearing difficulties   Vision Screening - Comments:: No rx glasses; had len implanted - not up to date with routine eye exams with [facility].   Dietary issues and exercise activities discussed: Current Exercise Habits: The patient does not participate in regular exercise at present, Exercise limited by: orthopedic condition(s)   Goals Addressed             This Visit's Progress    My goal for 2024 is to get back to being physically active.        Depression Screen    01/12/2023    1:10 PM 12/16/2022    1:25 PM 09/29/2022    2:21 PM 12/12/2021    1:20 PM 12/12/2021    1:16 PM 10/23/2021     9:33 AM 10/21/2020    9:25 AM  PHQ  2/9 Scores  PHQ - 2 Score 0 0 0 0 0 0 0  PHQ- 9 Score      0     Fall Risk    12/24/2022    3:12 PM 12/16/2022    1:25 PM 09/29/2022    2:21 PM 12/12/2021    1:18 PM 10/23/2021    9:33 AM  Fall Risk   Falls in the past year? 0 0 0 0 0  Number falls in past yr: 0 0  0 0  Injury with Fall? 0 0  0 0  Risk for fall due to : No Fall Risks No Fall Risks   No Fall Risks  Follow up Falls prevention discussed Falls evaluation completed  Falls evaluation completed     [place]:  Any stairs in or around the home? Yes ; lives on the main level If so, are there any without handrails? No  Home free of loose throw rugs in walkways, pet beds, electrical cords, etc? Yes  Adequate lighting in your home to reduce risk of falls? Yes   ASSISTIVE DEVICES UTILIZED TO PREVENT FALLS:  Life alert? No  Use of a cane, walker or w/c? No  Grab bars in the bathroom? Yes  Shower chair or bench in shower? Yes  Elevated toilet seat or a handicapped toilet? Yes   TIMED UP AND GO:  Was the test performed? No . Phone Visit  Cognitive Function:        12/24/2022    3:21 PM  6CIT Screen  What Year? 0 points  What month? 0 points  What time? 0 points  Count back from 20 0 points  Months in reverse 0 points  Repeat phrase 0 points  Total Score 0 points    Immunizations Immunization History  Administered Date(s) Administered   Fluad Quad(high Dose 65+) 07/25/2019, 08/26/2020, 08/27/2021, 08/27/2022   Influenza Whole 08/20/2009, 08/21/2010   Influenza, High Dose Seasonal PF 09/28/2016, 09/24/2017, 08/10/2018   Influenza, Seasonal, Injecte, Preservative Fre 11/11/2012   Influenza,inj,Quad PF,6+ Mos 11/20/2014   Influenza-Unspecified 11/07/2013, 11/08/2015   Moderna Covid-19 Vaccine Bivalent Booster 29yr & up 03/07/2021   PFIZER(Purple Top)SARS-COV-2 Vaccination 12/13/2019, 12/31/2019, 08/22/2020   Pneumococcal Conjugate-13  11/14/2013   Pneumococcal Polysaccharide-23 09/04/2010   Td 08/21/2010   Tdap 02/17/2021   Zoster Recombinat (Shingrix) 09/09/2021   Zoster, Live 11/20/2014    TDAP status: Up to date  Flu Vaccine status: Up to date  Pneumococcal vaccine status: Up to date  Covid-19 vaccine status: Completed vaccines  Qualifies for Shingles Vaccine? Yes   Zostavax completed Yes   Shingrix Completed?: Yes  Screening Tests Health Maintenance  Topic Date Due   COLONOSCOPY (Pts [phone] coverage will need to be confirmed)  04/14/2018   Zoster Vaccines- Shingrix (2 of 2) 11/04/2021   COVID-19 Vaccine (5 - 2023-24 season) 07/24/2022   Medicare Annual Wellness (AWV)  12/25/2023   DTaP/Tdap/Td (3 - Td or Tdap) 02/18/2031   Pneumonia Vaccine 6526Years old  Completed   INFLUENZA VACCINE  Completed   HPV VACCINES  Aged Out    Health Maintenance  Health Maintenance Due  Topic Date Due   COLONOSCOPY (Pts [id number] coverage will need to be confirmed)  04/14/2018   Zoster Vaccines- Shingrix (2 of 2) 11/04/2021   COVID-19 Vaccine (5 - 2023-24 season) 07/24/2022    Colorectal cancer screening: No longer required.   Lung Cancer Screening: (Low Dose CT Chest recommended if  Age 87-80 years, 30 pack-year currently smoking OR have quit w/in 15years.) does not qualify.   Lung Cancer Screening Referral: no  Additional Screening:  Hepatitis C Screening: does not qualify; Completed: no  Vision Screening: Recommended annual ophthalmology exams for early detection of glaucoma and other disorders of the eye. Is the patient up to date with their annual eye exam?  No  Who is the provider or what is the name of the office in which the patient attends annual eye exams? [facility] If pt is not established with a provider, would they like to be referred to a provider to establish care? No .   Dental Screening: Recommended annual dental exams for proper oral hygiene  Community  Resource Referral / Chronic Care Management: CRR required this visit?  No   CCM required this visit?  No      Plan:     I have personally reviewed and noted the following in the patient's chart:   Medical and social history Use of alcohol, tobacco or illicit drugs  Current medications and supplements including opioid prescriptions. Patient is not currently taking opioid prescriptions. Functional ability and status Nutritional status Physical activity Advanced directives List of other physicians Hospitalizations, surgeries, and ER visits in previous 12 months Vitals Screenings to include cognitive, depression, and falls Referrals and appointments  In addition, I have reviewed and discussed with patient certain preventive protocols, quality metrics, and best practice recommendations. A written personalized care plan for preventive services as well as general preventive health recommendations were provided to patient.     Sheral Flow, LPN   [id number]   Nurse Notes: Depression screening was completed during telephone visit on 12/24/2022.  There was an issue with the data not pulling into chart.  Medical screening examination/treatment/procedure(s) were performed by non-physician practitioner and as supervising physician I was immediately available for consultation/collaboration.  I agree with above. Lew Dawes, MD   Medical screening examination/treatment/procedure(s) were performed by non-physician practitioner and as supervising physician I was immediately available for consultation/collaboration.  I agree with above. Lew Dawes, MD

## 2022-12-24 NOTE — Patient Instructions (Signed)
Sergio Hughes , Thank you for taking time to come for your Medicare Wellness Visit. I appreciate your ongoing commitment to your health goals. Please review the following plan we discussed and let me know if I can assist you in the future.   These are the goals we discussed:  Goals      My goal for 2024 is to get back to being physically active.        This is a list of the screening recommended for you and due dates:  Health Maintenance  Topic Date Due   Colon Cancer Screening  04/14/2018   Zoster (Shingles) Vaccine (2 of 2) 11/04/2021   COVID-19 Vaccine (5 - 2023-24 season) 07/24/2022   Medicare Annual Wellness Visit  12/25/2023   DTaP/Tdap/Td vaccine (3 - Td or Tdap) 02/18/2031   Pneumonia Vaccine  Completed   Flu Shot  Completed   HPV Vaccine  Aged Out    Advanced directives: Yes  Conditions/risks identified: Yes  Next appointment: Follow up in one year for your annual wellness visit.   Preventive Care 25 Years and Older, Male  Preventive care refers to lifestyle choices and visits with your health care provider that can promote health and wellness. What does preventive care include? A yearly physical exam. This is also called an annual well check. Dental exams once or twice a year. Routine eye exams. Ask your health care provider how often you should have your eyes checked. Personal lifestyle choices, including: Daily care of your teeth and gums. Regular physical activity. Eating a healthy diet. Avoiding tobacco and drug use. Limiting alcohol use. Practicing safe sex. Taking low doses of aspirin every day. Taking vitamin and mineral supplements as recommended by your health care provider. What happens during an annual well check? The services and screenings done by your health care provider during your annual well check will depend on your age, overall health, lifestyle risk factors, and family history of disease. Counseling  Your health care provider may ask you  questions about your: Alcohol use. Tobacco use. Drug use. Emotional well-being. Home and relationship well-being. Sexual activity. Eating habits. History of falls. Memory and ability to understand (cognition). Work and work Statistician. Screening  You may have the following tests or measurements: Height, weight, and BMI. Blood pressure. Lipid and cholesterol levels. These may be checked every 5 years, or more frequently if you are over 58 years old. Skin check. Lung cancer screening. You may have this screening every year starting at age 7 if you have a 30-pack-year history of smoking and currently smoke or have quit within the past 15 years. Fecal occult blood test (FOBT) of the stool. You may have this test every year starting at age 11. Flexible sigmoidoscopy or colonoscopy. You may have a sigmoidoscopy every 5 years or a colonoscopy every 10 years starting at age 49. Prostate cancer screening. Recommendations will vary depending on your family history and other risks. Hepatitis C blood test. Hepatitis B blood test. Sexually transmitted disease (STD) testing. Diabetes screening. This is done by checking your blood sugar (glucose) after you have not eaten for a while (fasting). You may have this done every 1-3 years. Abdominal aortic aneurysm (AAA) screening. You may need this if you are a current or former smoker. Osteoporosis. You may be screened starting at age 45 if you are at high risk. Talk with your health care provider about your test results, treatment options, and if necessary, the need for more tests. Vaccines  Your health care provider may recommend certain vaccines, such as: Influenza vaccine. This is recommended every year. Tetanus, diphtheria, and acellular pertussis (Tdap, Td) vaccine. You may need a Td booster every 10 years. Zoster vaccine. You may need this after age 65. Pneumococcal 13-valent conjugate (PCV13) vaccine. One dose is recommended after age  66. Pneumococcal polysaccharide (PPSV23) vaccine. One dose is recommended after age 12. Talk to your health care provider about which screenings and vaccines you need and how often you need them. This information is not intended to replace advice given to you by your health care provider. Make sure you discuss any questions you have with your health care provider. Document Released: 12/06/2015 Document Revised: 07/29/2016 Document Reviewed: 09/10/2015 Elsevier Interactive Patient Education  2017 [place] Prevention in the Home Falls can cause injuries. They can happen to people of all ages. There are many things you can do to make your home safe and to help prevent falls. What can I do on the outside of my home? Regularly fix the edges of walkways and driveways and fix any cracks. Remove anything that might make you trip as you walk through a door, such as a raised step or threshold. Trim any bushes or trees on the path to your home. Use bright outdoor lighting. Clear any walking paths of anything that might make someone trip, such as rocks or tools. Regularly check to see if handrails are loose or broken. Make sure that both sides of any steps have handrails. Any raised decks and porches should have guardrails on the edges. Have any leaves, snow, or ice cleared regularly. Use sand or salt on walking paths during winter. Clean up any spills in your garage right away. This includes oil or grease spills. What can I do in the bathroom? Use night lights. Install grab bars by the toilet and in the tub and shower. Do not use towel bars as grab bars. Use non-skid mats or decals in the tub or shower. If you need to sit down in the shower, use a plastic, non-slip stool. Keep the floor dry. Clean up any water that spills on the floor as soon as it happens. Remove soap buildup in the tub or shower regularly. Attach bath mats securely with double-sided non-slip rug tape. Do not have throw  rugs and other things on the floor that can make you trip. What can I do in the bedroom? Use night lights. Make sure that you have a light by your bed that is easy to reach. Do not use any sheets or blankets that are too big for your bed. They should not hang down onto the floor. Have a firm chair that has side arms. You can use this for support while you get dressed. Do not have throw rugs and other things on the floor that can make you trip. What can I do in the kitchen? Clean up any spills right away. Avoid walking on wet floors. Keep items that you use a lot in easy-to-reach places. If you need to reach something above you, use a strong step stool that has a grab bar. Keep electrical cords out of the way. Do not use floor polish or wax that makes floors slippery. If you must use wax, use non-skid floor wax. Do not have throw rugs and other things on the floor that can make you trip. What can I do with my stairs? Do not leave any items on the stairs. Make sure that there are  handrails on both sides of the stairs and use them. Fix handrails that are broken or loose. Make sure that handrails are as long as the stairways. Check any carpeting to make sure that it is firmly attached to the stairs. Fix any carpet that is loose or worn. Avoid having throw rugs at the top or bottom of the stairs. If you do have throw rugs, attach them to the floor with carpet tape. Make sure that you have a light switch at the top of the stairs and the bottom of the stairs. If you do not have them, ask someone to add them for you. What else can I do to help prevent falls? Wear shoes that: Do not have high heels. Have rubber bottoms. Are comfortable and fit you well. Are closed at the toe. Do not wear sandals. If you use a stepladder: Make sure that it is fully opened. Do not climb a closed stepladder. Make sure that both sides of the stepladder are locked into place. Ask someone to hold it for you, if  possible. Clearly mark and make sure that you can see: Any grab bars or handrails. First and last steps. Where the edge of each step is. Use tools that help you move around (mobility aids) if they are needed. These include: Canes. Walkers. Scooters. Crutches. Turn on the lights when you go into a dark area. Replace any light bulbs as soon as they burn out. Set up your furniture so you have a clear path. Avoid moving your furniture around. If any of your floors are uneven, fix them. If there are any pets around you, be aware of where they are. Review your medicines with your doctor. Some medicines can make you feel dizzy. This can increase your chance of falling. Ask your doctor what other things that you can do to help prevent falls. This information is not intended to replace advice given to you by your health care provider. Make sure you discuss any questions you have with your health care provider. Document Released: 09/05/2009 Document Revised: 04/16/2016 Document Reviewed: 12/14/2014 Elsevier Interactive Patient Education  2017 [employer].

## 2022-12-30 ENCOUNTER — Ambulatory Visit: Payer: Medicare HMO | Admitting: Urology

## 2022-12-30 ENCOUNTER — Encounter: Payer: Self-pay | Admitting: Internal Medicine

## 2023-01-05 ENCOUNTER — Other Ambulatory Visit: Payer: Self-pay

## 2023-01-05 ENCOUNTER — Encounter: Payer: Self-pay | Admitting: Urology

## 2023-01-05 ENCOUNTER — Ambulatory Visit: Payer: Medicare HMO | Admitting: Urology

## 2023-01-05 VITALS — BP 112/57 | HR 70

## 2023-01-05 DIAGNOSIS — C61 Malignant neoplasm of prostate: Secondary | ICD-10-CM

## 2023-01-05 DIAGNOSIS — R351 Nocturia: Secondary | ICD-10-CM

## 2023-01-05 DIAGNOSIS — R972 Elevated prostate specific antigen [PSA]: Secondary | ICD-10-CM

## 2023-01-05 DIAGNOSIS — N401 Enlarged prostate with lower urinary tract symptoms: Secondary | ICD-10-CM | POA: Diagnosis not present

## 2023-01-05 LAB — URINALYSIS, ROUTINE W REFLEX MICROSCOPIC
Bilirubin, UA: NEGATIVE
Glucose, UA: NEGATIVE
Ketones, UA: NEGATIVE
Leukocytes,UA: NEGATIVE
Nitrite, UA: NEGATIVE
RBC, UA: NEGATIVE
Specific Gravity, UA: 1.015 (ref 1.005–1.030)
Urobilinogen, Ur: 0.2 mg/dL (ref 0.2–1.0)
pH, UA: 5 (ref 5.0–7.5)

## 2023-01-05 LAB — MICROSCOPIC EXAMINATION: Bacteria, UA: NONE SEEN

## 2023-01-05 MED ORDER — TAMSULOSIN HCL 0.4 MG PO CAPS: 0.4000 mg | ORAL_CAPSULE | Freq: Two times a day (BID) | ORAL | 11 refills | Status: DC

## 2023-01-05 NOTE — Progress Notes (Unsigned)
01/05/2023 11:50 AM   Sergio Hughes 07-21-1941 DO:[id number]  Referring provider: Cassandria Anger, MD [place],  [place] [address]  Followup BPh and prostate cancer   HPI: Sergio Hughes is a 82yo here for followup for prostate cancer and BPH. IPSS 15 QOL 3 on flomnax 0.29m BID. Nocturia improved to 2x. Urinary frequency and urgency improved. No recent PSA. No dysuria or hematuria   PMH: Past Medical History:  Diagnosis Date   Bilateral carotid bruits    uKoreacarotid bilateral 05-14-2022 epic   Chronic kidney disease stage 4    Coronary artery calcification seen on CT scan    Hyperlipidemia    Hypertension    per pt on 08-25-2022 off amlodipine last 3 to 4 months per nephrology dr cLouanna Raw  Osteoarthritis    Prostate cancer ([facility]    RBBB     Surgical History: Past Surgical History:  Procedure Laterality Date   COLONOSCOPY     CYSTOSCOPY N/A 09/03/2022   Procedure: CYSTOSCOPY;  Surgeon: MCleon Gustin MD;  [facility]  Service: Urology;  Laterality: N/A;   INGUINAL HERNIA REPAIR  2005   Right   LESION REMOVAL Left 09/23/2020   Procedure: MINOR EXICISION OF LESION OF LEFT EAR;  Surgeon: RIzora Gala MD;  [place] [facility]  Service: ENT;  Laterality: Left;   PARTIAL COLECTOMY Right 12/16/2015   Procedure: PARTIAL CECECTOMY AND APPENDECTOMY;  Surgeon: JJudeth Horn MD;  [place] [facility]  Service: General;  Laterality: Right;   POLYPECTOMY     RADIOACTIVE SEED IMPLANT N/A 09/03/2022   Procedure: RADIOACTIVE SEED IMPLANT/BRACHYTHERAPY IMPLANT;  Surgeon: MCleon Gustin MD;  [facility]  Service: Urology;  Laterality: N/A;   SPACE OAR INSTILLATION N/A 09/03/2022   Procedure: SPACE OAR INSTILLATION;  Surgeon: MCleon Gustin MD;  [facility]  Service: Urology;  Laterality: N/A;   VASECTOMY      Home Medications:  Allergies as of 01/05/2023        Reactions   Bupropion Hcl    REACTION: extreme dizziness   Lovastatin    myalgias   Uroxatral [alfuzosin]    Weakness, tinnitus        Medication List        Accurate as of January 05, 2023 11:50 AM. If you have any questions, ask your nurse or doctor.          aspirin EC 81 MG tablet Take 81 mg by mouth daily.   famotidine 40 MG tablet Commonly known as: Pepcid Take 1 tablet (40 mg total) by mouth daily.   Fish Oil 1000 MG Caps 1,200 mg daily at 2 PM.   Glucosamine 1500 Complex Caps Take by mouth daily at 2 PM.   loratadine 10 MG tablet Commonly known as: CLARITIN Take 1 tablet (10 mg total) by mouth daily.   pravastatin 20 MG tablet Commonly known as: PRAVACHOL TAKE 1 TABLET EVERY DAY   silodosin 8 MG Caps capsule Commonly known as: RAPAFLO Take 1 capsule (8 mg total) by mouth at bedtime.   tamsulosin 0.4 MG Caps capsule Commonly known as: FLOMAX Take 1 capsule (0.4 mg total) by mouth 2 (two) times daily.   triamcinolone cream 0.1 % Commonly known as: KENALOG Apply 1 Application topically 2 (two) times daily.   Vitamin D 50 MCG (2000 UT) Caps Take 1 capsule by mouth daily.  Allergies:  Allergies  Allergen Reactions   Bupropion Hcl     REACTION: extreme dizziness   Lovastatin     myalgias   Uroxatral [Alfuzosin]     Weakness, tinnitus    Family History: Family History  Problem Relation Age of Onset   Hypertension Father    Cancer Father        brain ca   Prostate cancer Brother 98   Colon cancer Neg Hx    Rectal cancer Neg Hx    Stomach cancer Neg Hx    Esophageal cancer Neg Hx     Social History:  reports that he quit smoking about 57 years ago. His smoking use included cigarettes. He has a 5.00 pack-year smoking history. He has never used smokeless tobacco. He reports that he does not currently use alcohol. He reports that he does not use drugs.  ROS: All other review of systems were reviewed and are negative except  what is noted above in HPI  Physical Exam: BP (!) 112/57   Pulse 70   Constitutional:  Alert and oriented, No acute distress. HEENT: [place] AT, moist mucus membranes.  Trachea midline, no masses. Cardiovascular: No clubbing, cyanosis, or edema. Respiratory: Normal respiratory effort, no increased work of breathing. GI: Abdomen is soft, nontender, nondistended, no abdominal masses GU: No CVA tenderness.  Lymph: No cervical or inguinal lymphadenopathy. Skin: No rashes, bruises or suspicious lesions. Neurologic: Grossly intact, no focal deficits, moving all 4 extremities. Psychiatric: Normal mood and affect.  Laboratory Data: Lab Results  Component Value Date   WBC 8.2 11/02/2022   HGB 11.1 (A) 11/02/2022   HCT 33 (A) 11/02/2022   MCV 90 08/28/2022   PLT 242 11/02/2022    Lab Results  Component Value Date   CREATININE 3.09 (H) 12/16/2022    Lab Results  Component Value Date   PSA 11.03 (H) 10/23/2021   PSA 6.35 (H) 09/29/2017   PSA 6.51 (H) 05/24/2017    No results found for: "TESTOSTERONE"  Lab Results  Component Value Date   HGBA1C 5.8 (H) 12/09/2015    Urinalysis    Component Value Date/Time   COLORURINE YELLOW 09/14/2022 1410   APPEARANCEUR Clear 09/23/2022 1411   LABSPEC 1.010 09/14/2022 1410   PHURINE 5.5 09/14/2022 1410   GLUCOSEU Negative 09/23/2022 1411   GLUCOSEU NEGATIVE 09/14/2022 1410   HGBUR MODERATE (A) 09/14/2022 1410   BILIRUBINUR Negative 09/23/2022 1411   KETONESUR NEGATIVE 09/14/2022 1410   PROTEINUR 2+ (A) 09/23/2022 1411   UROBILINOGEN 0.2 09/14/2022 1410   NITRITE Negative 09/23/2022 1411   NITRITE POSITIVE (A) 09/14/2022 1410   LEUKOCYTESUR Trace (A) 09/23/2022 1411   LEUKOCYTESUR LARGE (A) 09/14/2022 1410    Lab Results  Component Value Date   LABMICR See below: 09/23/2022   WBCUA 0-5 09/23/2022   LABEPIT 0-10 09/23/2022   MUCUS Present 06/12/2022   BACTERIA Few (A) 09/23/2022    Pertinent Imaging:  No results found for  this or any previous visit.  No results found for this or any previous visit.  No results found for this or any previous visit.  No results found for this or any previous visit.  Results for orders placed during the hospital encounter of 05/14/22  US RENAL  Narrative CLINICAL DATA:  Chronic renal disease  EXAM: RENAL / URINARY TRACT ULTRASOUND COMPLETE  COMPARISON:  Renal ultrasound 05/10/2020  FINDINGS: Right Kidney:  Renal measurements: 8.5 x 4.0 x 3.8 cm = volume: 67.2 mL. Renal cortical thinning.  Increased cortical echogenicity. No hydronephrosis.  Left Kidney:  Renal measurements: 9.5 x 4.6 x 4.2 cm = volume: 94.2 mL. Renal cortical thinning. Increased renal cortical echogenicity. No hydronephrosis. Probable 3 mm stone inferior pole.  Bladder:  Appears normal for degree of bladder distention.  Other:  None.  IMPRESSION: No hydronephrosis.  Increased cortical echogenicity compatible with chronic medical renal disease.   Electronically Signed By: Lovey Newcomer M.D. On: 05/14/2022 15:05  No valid procedures specified. No results found for this or any previous visit.  No results found for this or any previous visit.   Assessment & Plan:    1. Prostate cancer ([place]) -PSA today -followup 3 months with PS - Urinalysis, Routine w reflex microscopic  2. Benign prostatic hyperplasia with nocturia -continue flomax 0.23m BID  3. Nocturia -continue flomax 0.411mBID   No follow-ups on file.  PaNicolette BangMD  [facility] [place]

## 2023-01-05 NOTE — Patient Instructions (Signed)

## 2023-01-06 LAB — PSA: Prostate Specific Ag, Serum: 0.4 ng/mL (ref 0.0–4.0)

## 2023-01-10 ENCOUNTER — Other Ambulatory Visit: Payer: Self-pay | Admitting: Internal Medicine

## 2023-01-10 DIAGNOSIS — N62 Hypertrophy of breast: Secondary | ICD-10-CM

## 2023-01-11 DIAGNOSIS — D631 Anemia in chronic kidney disease: Secondary | ICD-10-CM | POA: Diagnosis not present

## 2023-01-11 DIAGNOSIS — N2581 Secondary hyperparathyroidism of renal origin: Secondary | ICD-10-CM | POA: Diagnosis not present

## 2023-01-11 DIAGNOSIS — I129 Hypertensive chronic kidney disease with stage 1 through stage 4 chronic kidney disease, or unspecified chronic kidney disease: Secondary | ICD-10-CM | POA: Diagnosis not present

## 2023-01-11 DIAGNOSIS — N184 Chronic kidney disease, stage 4 (severe): Secondary | ICD-10-CM | POA: Diagnosis not present

## 2023-01-11 DIAGNOSIS — R809 Proteinuria, unspecified: Secondary | ICD-10-CM | POA: Diagnosis not present

## 2023-01-14 ENCOUNTER — Other Ambulatory Visit: Payer: Self-pay | Admitting: Internal Medicine

## 2023-01-14 DIAGNOSIS — N62 Hypertrophy of breast: Secondary | ICD-10-CM

## 2023-01-29 DIAGNOSIS — N184 Chronic kidney disease, stage 4 (severe): Secondary | ICD-10-CM | POA: Diagnosis not present

## 2023-01-29 DIAGNOSIS — N189 Chronic kidney disease, unspecified: Secondary | ICD-10-CM | POA: Diagnosis not present

## 2023-02-05 ENCOUNTER — Ambulatory Visit: Payer: Medicare HMO

## 2023-02-05 ENCOUNTER — Ambulatory Visit
Admission: RE | Admit: 2023-02-05 | Discharge: 2023-02-05 | Disposition: A | Discharge status: Home / Self Care | Payer: Medicare HMO | Source: Ambulatory Visit | Attending: Internal Medicine | Admitting: Internal Medicine

## 2023-02-05 DIAGNOSIS — N62 Hypertrophy of breast: Secondary | ICD-10-CM | POA: Diagnosis not present

## 2023-02-10 ENCOUNTER — Encounter: Payer: Self-pay | Admitting: Internal Medicine

## 2023-02-12 ENCOUNTER — Other Ambulatory Visit: Payer: Self-pay | Admitting: Internal Medicine

## 2023-02-12 DIAGNOSIS — Z86010 Personal history of colonic polyps: Secondary | ICD-10-CM

## 2023-02-17 ENCOUNTER — Encounter: Payer: Self-pay | Admitting: Physician Assistant

## 2023-03-01 DIAGNOSIS — N2581 Secondary hyperparathyroidism of renal origin: Secondary | ICD-10-CM | POA: Diagnosis not present

## 2023-03-01 DIAGNOSIS — N184 Chronic kidney disease, stage 4 (severe): Secondary | ICD-10-CM | POA: Diagnosis not present

## 2023-03-08 DIAGNOSIS — R809 Proteinuria, unspecified: Secondary | ICD-10-CM | POA: Diagnosis not present

## 2023-03-08 DIAGNOSIS — I129 Hypertensive chronic kidney disease with stage 1 through stage 4 chronic kidney disease, or unspecified chronic kidney disease: Secondary | ICD-10-CM | POA: Diagnosis not present

## 2023-03-08 DIAGNOSIS — D631 Anemia in chronic kidney disease: Secondary | ICD-10-CM | POA: Diagnosis not present

## 2023-03-08 DIAGNOSIS — N184 Chronic kidney disease, stage 4 (severe): Secondary | ICD-10-CM | POA: Diagnosis not present

## 2023-03-08 DIAGNOSIS — N2581 Secondary hyperparathyroidism of renal origin: Secondary | ICD-10-CM | POA: Diagnosis not present

## 2023-03-15 ENCOUNTER — Telehealth: Payer: Self-pay

## 2023-03-15 NOTE — Telephone Encounter (Signed)
Error, disregard.

## 2023-03-17 ENCOUNTER — Ambulatory Visit (INDEPENDENT_AMBULATORY_CARE_PROVIDER_SITE_OTHER): Payer: Medicare HMO | Admitting: Internal Medicine

## 2023-03-17 ENCOUNTER — Encounter: Payer: Self-pay | Admitting: Internal Medicine

## 2023-03-17 VITALS — BP 120/60 | HR 61 | Temp 98.7°F | Ht 65.0 in | Wt 140.0 lb

## 2023-03-17 DIAGNOSIS — K635 Polyp of colon: Secondary | ICD-10-CM

## 2023-03-17 DIAGNOSIS — K219 Gastro-esophageal reflux disease without esophagitis: Secondary | ICD-10-CM

## 2023-03-17 DIAGNOSIS — N184 Chronic kidney disease, stage 4 (severe): Secondary | ICD-10-CM | POA: Diagnosis not present

## 2023-03-17 DIAGNOSIS — B079 Viral wart, unspecified: Secondary | ICD-10-CM | POA: Diagnosis not present

## 2023-03-17 DIAGNOSIS — I10 Essential (primary) hypertension: Secondary | ICD-10-CM

## 2023-03-17 DIAGNOSIS — I2510 Atherosclerotic heart disease of native coronary artery without angina pectoris: Secondary | ICD-10-CM | POA: Diagnosis not present

## 2023-03-17 DIAGNOSIS — I2583 Coronary atherosclerosis due to lipid rich plaque: Secondary | ICD-10-CM | POA: Diagnosis not present

## 2023-03-17 NOTE — Assessment & Plan Note (Signed)
F/u appt w/A Monica Becton, PA

## 2023-03-17 NOTE — Progress Notes (Signed)
Subjective:  Patient ID: Sergio Hughes, male    DOB: 1941/05/12  Age: 82 y.o. MRN: [id number]  CC: No chief complaint on file.   HPI Sergio Hughes presents for CRF, HTN, colon polyps  Outpatient Medications Prior to Visit  Medication Sig Dispense Refill   Cholecalciferol (VITAMIN D) 2000 UNITS CAPS Take 1 capsule by mouth daily.     Glucosamine-Chondroit-Vit C-Mn (GLUCOSAMINE 1500 COMPLEX) CAPS Take by mouth daily at 2 PM.     Omega-3 Fatty Acids (FISH OIL) 1000 MG CAPS 1,200 mg daily at 2 PM.     pravastatin (PRAVACHOL) 20 MG tablet TAKE 1 TABLET EVERY DAY 90 tablet 3   tamsulosin (FLOMAX) 0.4 MG CAPS capsule Take 1 capsule (0.4 mg total) by mouth 2 (two) times daily. 60 capsule 11   triamcinolone cream (KENALOG) 0.1 % Apply 1 Application topically 2 (two) times daily. 450 g 3   No facility-administered medications prior to visit.    ROS: Review of Systems  Constitutional:  Negative for appetite change, fatigue and unexpected weight change.  HENT:  Negative for congestion, nosebleeds, sneezing, sore throat and trouble swallowing.   Eyes:  Negative for itching and visual disturbance.  Respiratory:  Negative for cough.   Cardiovascular:  Negative for chest pain, palpitations and leg swelling.  Gastrointestinal:  Negative for abdominal distention, blood in stool, diarrhea and nausea.  Genitourinary:  Negative for frequency and hematuria.  Musculoskeletal:  Negative for back pain, gait problem, joint swelling and neck pain.  Skin:  Negative for rash.  Neurological:  Negative for dizziness, tremors, speech difficulty and weakness.  Psychiatric/Behavioral:  Negative for agitation, dysphoric mood and sleep disturbance. The patient is not nervous/anxious.     Objective:  BP 120/60 (BP Location: Right Arm, Patient Position: Sitting, Cuff Size: Normal)   Pulse 61   Temp 98.7 F (37.1 C) (Oral)   Ht 5\' 5"  (1.651 m)   Wt 140 lb (63.5 kg)   SpO2 98%   BMI 23.30 kg/m   BP Readings  from Last 3 Encounters:  03/17/23 120/60  01/05/23 (!) 112/57  12/16/22 102/68    Wt Readings from Last 3 Encounters:  03/17/23 140 lb (63.5 kg)  12/24/22 144 lb (65.3 kg)  12/16/22 140 lb (63.5 kg)    Physical Exam Constitutional:      General: He is not in acute distress.    Appearance: Normal appearance. He is well-developed.     Comments: NAD  Eyes:     Conjunctiva/sclera: Conjunctivae normal.     Pupils: Pupils are equal, round, and reactive to light.  Neck:     Thyroid: No thyromegaly.     Vascular: No JVD.  Cardiovascular:     Rate and Rhythm: Normal rate and regular rhythm.     Heart sounds: Normal heart sounds. No murmur heard.    No friction rub. No gallop.  Pulmonary:     Effort: Pulmonary effort is normal. No respiratory distress.     Breath sounds: Normal breath sounds. No wheezing or rales.  Chest:     Chest wall: No tenderness.  Abdominal:     General: Bowel sounds are normal. There is no distension.     Palpations: Abdomen is soft. There is no mass.     Tenderness: There is no abdominal tenderness. There is no guarding or rebound.  Musculoskeletal:        General: No tenderness. Normal range of motion.     Cervical back: Normal  range of motion.  Lymphadenopathy:     Cervical: No cervical adenopathy.  Skin:    General: Skin is warm and dry.     Findings: No rash.  Neurological:     Mental Status: He is alert and oriented to person, place, and time.     Cranial Nerves: No cranial nerve deficit.     Motor: No abnormal muscle tone.     Coordination: Coordination normal.     Gait: Gait normal.     Deep Tendon Reflexes: Reflexes are normal and symmetric.  Psychiatric:        Behavior: Behavior normal.        Thought Content: Thought content normal.        Judgment: Judgment normal.   Ward R cheek   Procedure Note :     Procedure : Cryosurgery   Indication:  Wart(s)  x1   Risks including unsuccessful procedure , bleeding, infection, bruising,  scar, a need for a repeat  procedure and others were explained to the patient in detail as well as the benefits. Informed consent was obtained verbally.    1 lesion(s)  on R cheek    was/were treated with liquid nitrogen on a Q-tip in a usual fasion . Band-Aid was applied and antibiotic ointment was given for a later use.   Tolerated well. Complications none.   Postprocedure instructions :     Keep the wounds clean. You can wash them with liquid soap and water. Pat dry with gauze or a Kleenex tissue  Before applying antibiotic ointment and a Band-Aid.   You need to report immediately  if  any signs of infection develop.    Lab Results  Component Value Date   WBC 8.2 11/02/2022   HGB 11.1 (A) 11/02/2022   HCT 33 (A) 11/02/2022   PLT 242 11/02/2022   GLUCOSE 128 (H) 12/16/2022   CHOL 181 10/23/2021   TRIG 76.0 10/23/2021   HDL 82.20 10/23/2021   LDLDIRECT 144.9 11/14/2013   LDLCALC 83 10/23/2021   ALT 15 10/23/2021   AST 22 10/23/2021   NA 136 12/16/2022   K 4.5 12/16/2022   CL 102 12/16/2022   CREATININE 3.09 (H) 12/16/2022   BUN 78 (H) 12/16/2022   CO2 27 12/16/2022   TSH 0.59 10/23/2021   PSA 11.03 (H) 10/23/2021   INR 1.02 12/09/2015   HGBA1C 5.8 (H) 12/09/2015    MM DIAG BREAST TOMO BILATERAL  Result Date: 02/05/2023 CLINICAL DATA:  82 year old male with intermittent tenderness of the left nipple for 1-2 months. EXAM: DIGITAL DIAGNOSTIC BILATERAL MAMMOGRAM WITH TOMOSYNTHESIS TECHNIQUE: Bilateral digital diagnostic mammography and breast tomosynthesis was performed. COMPARISON:  Previous exam(s). ACR Breast Density Category a: The breasts are almost entirely fatty. FINDINGS: [place] Dragon shaped fibroglandular tissue is seen originating from the left nipple and extending posteriorly. This is consistent with mild left-sided gynecomastia. Otherwise, no suspicious findings in either breast. IMPRESSION: 1. Mild left-sided gynecomastia counting for the patient's clinical symptoms. 2.  No mammographic evidence of malignancy in either breast. RECOMMENDATION: I discussed with the patient the fact that gynecomastia can occur in older men as testosterone levels decrease with age or in younger men with low testosterone levels, causing a change in the serum testosterone:estrogen ratio. We also discussed other potential etiologies of gynecomastia including numerous prescription medications. We also discussed the possibility of surgical excision if symptoms continue and if an etiology of the gynecomastia cannot be determined and therefore corrected. I have discussed the findings and  recommendations with the patient. If applicable, a reminder letter will be sent to the patient regarding the next appointment. BI-RADS CATEGORY  2: Benign. Electronically Signed   By: Sande Brothers M.D.   On: 02/05/2023 13:27   Assessment & Plan:   Problem List Items Addressed This Visit       Cardiovascular and Mediastinum   CAD (coronary artery disease)    Cont on Pravastatin       Essential hypertension - Primary    On NAS        Digestive   Colon polyps    F/u appt w/A [place], PA      GERD (gastroesophageal reflux disease)    Cont on Pepcid        Musculoskeletal and Integument   Wart of face [B07.9]    New See Procedure        Genitourinary   CRF (chronic renal failure), stage 4 (severe)     Considering PD Dr Rayvon Char q 3 months         No orders of the defined types were placed in this encounter.     Follow-up: Return in about 4 months (around 07/17/2023) for a follow-up visit.  Sonda Primes, MD

## 2023-03-17 NOTE — Assessment & Plan Note (Signed)
Cont on Pepcid 

## 2023-03-17 NOTE — Assessment & Plan Note (Signed)
Cont on  Pravastatin 

## 2023-03-17 NOTE — Assessment & Plan Note (Signed)
On NAS

## 2023-03-17 NOTE — Assessment & Plan Note (Addendum)
  Considering PD Dr Rayvon Char q 3 months

## 2023-03-17 NOTE — Assessment & Plan Note (Signed)
New See Procedure

## 2023-03-30 ENCOUNTER — Ambulatory Visit: Payer: Medicare HMO | Attending: Student | Admitting: Student

## 2023-03-30 ENCOUNTER — Encounter: Payer: Self-pay | Admitting: Student

## 2023-03-30 VITALS — BP 120/66 | HR 54 | Ht 65.0 in | Wt 139.6 lb

## 2023-03-30 DIAGNOSIS — N184 Chronic kidney disease, stage 4 (severe): Secondary | ICD-10-CM

## 2023-03-30 DIAGNOSIS — I2510 Atherosclerotic heart disease of native coronary artery without angina pectoris: Secondary | ICD-10-CM

## 2023-03-30 DIAGNOSIS — E785 Hyperlipidemia, unspecified: Secondary | ICD-10-CM | POA: Diagnosis not present

## 2023-03-30 DIAGNOSIS — Z8679 Personal history of other diseases of the circulatory system: Secondary | ICD-10-CM | POA: Diagnosis not present

## 2023-03-30 DIAGNOSIS — I2584 Coronary atherosclerosis due to calcified coronary lesion: Secondary | ICD-10-CM | POA: Diagnosis not present

## 2023-03-30 NOTE — Patient Instructions (Signed)
Medication Instructions:   Continue current regimen.   *If you need a refill on your cardiac medications before your next appointment, please call your pharmacy*   Lab Work:  Lipid Panel when fasting.   If you have labs (blood work) drawn today and your tests are completely normal, you will receive your results only by: MyChart Message (if you have MyChart) OR A paper copy in the mail If you have any lab test that is abnormal or we need to change your treatment, we will call you to review the results.   Follow-Up: At [facility], you and your health needs are our priority.  As part of our continuing mission to provide you with exceptional heart care, we have created designated Provider Care Teams.  These Care Teams include your primary Cardiologist (physician) and Advanced Practice Providers (APPs -  Physician Assistants and Nurse Practitioners) who all work together to provide you with the care you need, when you need it.  We recommend signing up for the patient portal called "MyChart".  Sign up information is provided on this After Visit Summary.  MyChart is used to connect with patients for Virtual Visits (Telemedicine).  Patients are able to view lab/test results, encounter notes, upcoming appointments, etc.  Non-urgent messages can be sent to your provider as well.   To learn more about what you can do with MyChart, go to [email or web address].    Your next appointment:   1 year(s)  Provider:   You may see Nona Dell, MD or one of the following Advanced Practice Providers on your designated Care Team:   [place], PA-C  Jacolyn Reedy, [place]

## 2023-03-30 NOTE — Progress Notes (Signed)
Cardiology Office Note    Date:  03/30/2023  ID:  Sergio Hughes, Sergio Hughes Aug 31, 1941, MRN [id number] Cardiologist: Nona Dell, MD    History of Present Illness:    Sergio Hughes is a 82 y.o. male with past medical history of coronary calcification by CT (low-risk NST in 08/2019), HTN, HLD, prostate cancer, Stage IV CKD and RBBB who presents to the office today for 91-month follow-up.  He was last examined by Ronie Spies, PA in 08/2022 for preoperative cardiac clearance in regards to radioactive seed implant/brachytherapy implant. He reported overall feeling well from a cardiac perspective and denied any recent anginal symptoms. He was cleared to proceed with surgery without the indication for further cardiac testing. He was found to be anemic and it was recommended to further address this with Urology given his reports of hematuria. By review of notes, he did have surgery later that month with no complications noted. He did have a follow-up echocardiogram in the interim as well which showed a preserved EF of 55 to 60% with no regional wall motion abnormalities. He did have mild LVH, normal RV function mild MR.  In talking with the patient today, he reports things have overall been stable from a cardiac perspective since his last office visit. He denies any recent chest pain or dyspnea on exertion.  No recent orthopnea, PND or pitting edema. He is the primary caregiver for his wife. He also remains active at baseline by playing golf and also working out at [employer] a few days each week. He denies any anginal symptoms with this. He does follow a renal diet and is followed closely by [facility].   Studies Reviewed:   EKG: EKG is not ordered today. EKG from 08/28/2022 is reviewed and shows NSR, HR 65 with known RBBB.   NST: 08/2019 No diagnostic ST segment changes to indicate ischemia. Small, mild intensity, apical anteroseptal defect that is partially reversible and suggestive of a mild ischemic  territory. This is a low risk study. Nuclear stress EF: 69%.  Echocardiogram: 08/2022 IMPRESSIONS     1. Left ventricular ejection fraction, by estimation, is 55 to 60%. The  left ventricle has normal function. The left ventricle has no regional  wall motion abnormalities. There is mild concentric left ventricular  hypertrophy. Left ventricular diastolic  parameters are indeterminate.   2. Right ventricular systolic function is normal. The right ventricular  size is normal. Tricuspid regurgitation signal is inadequate for assessing  PA pressure.   3. Left atrial size was upper normal.   4. The mitral valve is grossly normal. Mild mitral valve regurgitation.   5. The aortic valve is tricuspid. Aortic valve regurgitation is not  visualized. Aortic valve sclerosis/calcification is present, without any  evidence of aortic stenosis. Aortic valve mean gradient measures 3.0 mmHg.   6. The inferior vena cava is normal in size with greater than 50%  respiratory variability, suggesting right atrial pressure of 3 mmHg.   Comparison(s): No prior Echocardiogram.    Physical Exam:   VS:  BP 120/66   Pulse (!) 54   Ht 5\' 5"  (1.651 m)   Wt 139 lb 9.6 oz (63.3 kg)   SpO2 99%   BMI 23.23 kg/m    Wt Readings from Last 3 Encounters:  03/30/23 139 lb 9.6 oz (63.3 kg)  03/17/23 140 lb (63.5 kg)  12/24/22 144 lb (65.3 kg)     GEN: Pleasant, elderly male appearing in no acute distress NECK: No  JVD; No carotid bruits CARDIAC: RRR, no murmurs, rubs, gallops RESPIRATORY:  Clear to auscultation without rales, wheezing or rhonchi  ABDOMEN: Appears non-distended. No obvious abdominal masses. EXTREMITIES: No clubbing or cyanosis. No pitting edema.  Distal pedal pulses are 2+ bilaterally.   Assessment and Plan:   1. Coronary Calcification by CT - He had a coronary calcium score of 1709 by CT in 05/2019. NST at that time was low-risk as discussed above. Would have a high threshold for cardiac  catheterization given his underlying CKD.  - Thankfully, he continues to do well and denies any recent anginal symptoms. He remains on Pravastatin 20 mg daily and Fish Oil. He is not on beta-blocker therapy due to baseline bradycardia. ASA was discontinued last year given anemia and he does have follow-up with GI later this month. If workup is reassuring, would favor restarting ASA 81 mg daily.  2. History of HTN - His blood pressure is well-controlled at 120/66 during today's visit. He is no longer on antihypertensive therapy.  3. HLD - Followed by his PCP. He remains on Pravastatin 20 mg daily and Fish Oil. He has not had a recent FLP and is not fasting today. Was provided with a lab slip and will have this drawn in the next few weeks.  4. Stage 4 CKD - Followed by Dr. Arrie Aran. His creatinine was at 3.09 in 11/2022 which is close to his known baseline.     Signed, Ellsworth Lennox, PA-C

## 2023-04-01 DIAGNOSIS — I2584 Coronary atherosclerosis due to calcified coronary lesion: Secondary | ICD-10-CM | POA: Diagnosis not present

## 2023-04-01 DIAGNOSIS — E785 Hyperlipidemia, unspecified: Secondary | ICD-10-CM | POA: Diagnosis not present

## 2023-04-01 DIAGNOSIS — I2510 Atherosclerotic heart disease of native coronary artery without angina pectoris: Secondary | ICD-10-CM | POA: Diagnosis not present

## 2023-04-02 ENCOUNTER — Ambulatory Visit: Payer: Medicare HMO | Admitting: Physician Assistant

## 2023-04-02 ENCOUNTER — Encounter: Payer: Self-pay | Admitting: Physician Assistant

## 2023-04-02 VITALS — BP 116/60 | HR 64 | Ht 62.0 in | Wt 138.5 lb

## 2023-04-02 DIAGNOSIS — D369 Benign neoplasm, unspecified site: Secondary | ICD-10-CM

## 2023-04-02 DIAGNOSIS — Z86010 Personal history of colonic polyps: Secondary | ICD-10-CM | POA: Diagnosis not present

## 2023-04-02 LAB — LIPID PANEL
Chol/HDL Ratio: 2 ratio (ref 0.0–5.0)
Cholesterol, Total: 144 mg/dL (ref 100–199)
HDL: 72 mg/dL (ref >39)
LDL Chol Calc (NIH): 62 mg/dL (ref 0–99)
Triglycerides: 44 mg/dL (ref 0–149)
VLDL Cholesterol Cal: 10 mg/dL (ref 5–40)

## 2023-04-02 NOTE — Patient Instructions (Addendum)
_______________________________________________________  If your blood pressure at your visit was 140/90 or greater, please contact your primary care physician to follow up on this. _______________________________________________________  If you are age 82 or older, your body mass index should be between 23-30. Your Body mass index is 25.33 kg/m. If this is out of the aforementioned range listed, please consider follow up with your Primary Care Provider. ________________________________________________________  The [place] providers would like to encourage you to use [facility] to communicate with providers for non-urgent requests or questions.  Due to long hold times on the telephone, sending your provider a message by [facility] may be a faster and more efficient way to get a response.  Please allow 48 business hours for a response.  Please remember that this is for non-urgent requests.  _______________________________________________________  Follow up as needed with myself or Dr Kennyth Lose.  Thank you for entrusting me with your care and choosing [facility].  Amy Esterwood, PA-C

## 2023-04-05 ENCOUNTER — Encounter: Payer: Self-pay | Admitting: Physician Assistant

## 2023-04-05 DIAGNOSIS — N184 Chronic kidney disease, stage 4 (severe): Secondary | ICD-10-CM | POA: Diagnosis not present

## 2023-04-05 NOTE — Progress Notes (Addendum)
Subjective:    Patient ID: Sergio Hughes, male    DOB: [birth year], 82 y.o.   MRN: [id number]  HPI  Sergio Hughes is a pleasant 82 year old white male, previously established with Dr. Christella Hartigan, and last seen in the office in June 2019.  Patient comes in today to discuss possible follow-up colonoscopy.  He says he saw his PCP Dr. Posey Rea recently who suggested that he may be due for another colonoscopy and to follow-up here. Patient has a history of a recurrent cecal polyp initially found in 2002 and noted on multiple colonoscopies over many years.  This was resected in 2013 per Dr. [name] Motto, then repeat colonoscopy by Dr. Christella Hartigan in May 2016 found recurrent polyp at the same site and he was referred for surgical resection. He underwent partial cecectomy/appendectomy per Dr. Lindie Spruce in 2017 with finding of a 2.8 cm tubovillous adenoma.  When he was seen in follow-up by Dr. Christella Hartigan in June 2019 decision was made along with the patient not to plan for any further surveillance procedures.  Patient is currently asymptomatic, he has no complaints of changes in bowel habits, diarrhea, constipation melena or hematochezia.  He says his bowel movements are very regular. He does have history of prostate cancer stage IIb and has had radiation seed implants placed as of October 2023.  He also has chronic kidney disease stage IV, followed by Dr. Arrie Aran, and anticipating possible need for dialysis.  Patient says he would do peritoneal dialysis if needed which would allow him to be able to better care for his wife.  Other medical problems include hypertension, coronary artery disease, osteoarthritis, right bundle branch block..  Patient is not anxious to undergo another colonoscopy unless felt absolutely necessary.   Review of Systems Pertinent positive and negative review of systems were noted in the above HPI section.  All other review of systems was otherwise negative.   Outpatient Encounter Medications as of  04/02/2023  Medication Sig   Cholecalciferol (VITAMIN D) 2000 UNITS CAPS Take 1 capsule by mouth daily.   Glucosamine-Chondroit-Vit C-Mn (GLUCOSAMINE 1500 COMPLEX) CAPS Take by mouth daily at 2 PM.   Omega-3 Fatty Acids (FISH OIL) 1000 MG CAPS 1,200 mg daily at 2 PM.   pravastatin (PRAVACHOL) 20 MG tablet TAKE 1 TABLET EVERY DAY   tamsulosin (FLOMAX) 0.4 MG CAPS capsule Take 1 capsule (0.4 mg total) by mouth 2 (two) times daily.   [DISCONTINUED] triamcinolone cream (KENALOG) 0.1 % Apply 1 Application topically 2 (two) times daily.   No facility-administered encounter medications on file as of 04/02/2023.   Allergies  Allergen Reactions   Bupropion Hcl     REACTION: extreme dizziness   Lovastatin     myalgias   Uroxatral [Alfuzosin]     Weakness, tinnitus   Patient Active Problem List   Diagnosis Date Noted   Wart of face [B07.9] 03/17/2023   Gynecomastia 12/16/2022   Dysuria 09/14/2022   Malignant neoplasm of prostate (HCC) 05/28/2022   Tinnitus 04/24/2022   Neck pain on right side 04/24/2022   Bilateral carotid bruits 04/24/2022   GERD (gastroesophageal reflux disease) 04/24/2022   Hand cramps 02/17/2021   Skin lesion of left ear 09/09/2020   Foamy urine 12/19/2019   CAD (coronary artery disease) 06/21/2019   CRF (chronic renal failure), stage 4 (severe) (HCC) 08/10/2018   Elevated PSA 10/07/2017   Chest pain 06/07/2017   Headache 06/07/2017   Cough 11/27/2016   Abdominal pain, chronic, right lower quadrant 06/05/2016   Villous adenoma  of right colon 12/16/2015   Colon polyps 05/22/2015   Actinic keratoses 12/09/2014   Well adult exam 11/11/2012   Snoring 11/05/2011   Erectile dysfunction 02/19/2011   Dizziness 02/19/2011   Neoplasm of uncertain behavior of skin 08/21/2010   Osteoarthritis 02/18/2010   TOBACCO USE, QUIT 02/18/2010   TESTICULAR PAIN, RIGHT 08/20/2009   HAND PAIN, RIGHT 02/05/2009   Dyslipidemia 08/09/2008   Essential hypertension 08/09/2008    Social History   Socioeconomic History   Marital status: Married    Spouse name: Cayman Hanthorn   Number of children: 2   Years of education: college    Highest education level: Not on file  Occupational History   Occupation: retired  Tobacco Use   Smoking status: Former    Packs/day: 0.50    Years: 10.00    Additional pack years: 0.00    Total pack years: 5.00    Types: Cigarettes    Quit date: 12/14/1965    Years since quitting: 57.3   Smokeless tobacco: Never  Vaping Use   Vaping Use: Never used  Substance and Sexual Activity   Alcohol use: Not Currently   Drug use: No   Sexual activity: Not Currently  Other Topics Concern   Not on file  Social History Narrative   Regular exercise - YES - golf   Social Determinants of Health   Financial Resource Strain: Low Risk  (12/24/2022)   Overall Financial Resource Strain (CARDIA)    Difficulty of Paying Living Expenses: Not hard at all  Food Insecurity: No Food Insecurity (12/24/2022)   Hunger Vital Sign    Worried About Running Out of Food in the Last Year: Never true    Ran Out of Food in the Last Year: Never true  Transportation Needs: No Transportation Needs (12/24/2022)   PRAPARE - Administrator, Civil Service (Medical): No    Lack of Transportation (Non-Medical): No  Physical Activity: Inactive (12/24/2022)   Exercise Vital Sign    Days of Exercise per Week: 0 days    Minutes of Exercise per Session: 0 min  Stress: No Stress Concern Present (12/24/2022)   [employer] of Occupational Health - Occupational Stress Questionnaire    Feeling of Stress : Not at all  Social Connections: Moderately Integrated (12/24/2022)   Social Connection and Isolation Panel [NHANES]    Frequency of Communication with Friends and Family: Three times a week    Frequency of Social Gatherings with Friends and Family: Three times a week    Attends Religious Services: Never    Active Member of Clubs or Organizations: Yes    Attends  Banker Meetings: More than 4 times per year    Marital Status: Married  Catering manager Violence: Not At Risk (12/24/2022)   Humiliation, Afraid, Rape, and Kick questionnaire    Fear of Current or Ex-Partner: No    Emotionally Abused: No    Physically Abused: No    Sexually Abused: No    Mr. Gary family history includes Cancer in his father; Colon cancer in his brother; Diverticulitis in his mother; Hypertension in his father; Kidney disease (age of onset: 31) in his brother.      Objective:    Vitals:   04/02/23 1425  BP: 116/60  Pulse: 64    Physical Exam Well-developed well-nourished elderly WM  in no acute distress.  Height, Weight,138  BMI 25.3  HEENT; nontraumatic normocephalic, EOMI, PE R LA, sclera anicteric.  Neuro/Psych; alert  and oriented x4, grossly nonfocal mood and affect appropriate        Assessment & Plan:   #75 82 year old white male with history of adenomatous colon polyps, and a recurrent tubovillous adenoma of the cecum for which he eventually underwent surgical resection in January 2017 with partial cecectomy and appendectomy with finding of a 2.8 cm tubovillous adenoma. Patient had seen Dr. Christella Hartigan in follow-up in 2019 and decision made at that time not to pursue any further surveillance colonoscopies due to his age.  He comes in today on the advice of his PCP to discuss potential need for follow-up colonoscopy. He is currently asymptomatic.  #2 end-stage renal disease/chronic kidney disease stage IV #3 prostate CA status post radiation seed implants #4 osteoarthritis #5.  Coronary artery disease #6.  Hypertension  Plan; Will not plan for any further surveillance Colonoscopies. Happy to see this patient for any active GI issues. Patient will be established with Dr. Barron Alvine in Dr. Larae Grooms absence.   Retina Bernardy S Yaser Harvill PA-C 04/05/2023   Cc: Plotnikov, Georgina Quint, MD

## 2023-04-07 ENCOUNTER — Other Ambulatory Visit: Payer: Medicare HMO

## 2023-04-07 DIAGNOSIS — R351 Nocturia: Secondary | ICD-10-CM | POA: Diagnosis not present

## 2023-04-07 DIAGNOSIS — N401 Enlarged prostate with lower urinary tract symptoms: Secondary | ICD-10-CM | POA: Diagnosis not present

## 2023-04-07 DIAGNOSIS — C61 Malignant neoplasm of prostate: Secondary | ICD-10-CM | POA: Diagnosis not present

## 2023-04-07 DIAGNOSIS — R972 Elevated prostate specific antigen [PSA]: Secondary | ICD-10-CM | POA: Diagnosis not present

## 2023-04-08 LAB — PSA: Prostate Specific Ag, Serum: 0.3 ng/mL (ref 0.0–4.0)

## 2023-04-14 ENCOUNTER — Encounter: Payer: Self-pay | Admitting: Urology

## 2023-04-14 ENCOUNTER — Ambulatory Visit: Payer: Medicare HMO | Admitting: Urology

## 2023-04-14 VITALS — BP 109/58 | HR 59

## 2023-04-14 DIAGNOSIS — N401 Enlarged prostate with lower urinary tract symptoms: Secondary | ICD-10-CM

## 2023-04-14 DIAGNOSIS — R351 Nocturia: Secondary | ICD-10-CM | POA: Diagnosis not present

## 2023-04-14 DIAGNOSIS — C61 Malignant neoplasm of prostate: Secondary | ICD-10-CM | POA: Diagnosis not present

## 2023-04-14 MED ORDER — TAMSULOSIN HCL 0.4 MG PO CAPS: 0.4000 mg | ORAL_CAPSULE | Freq: Two times a day (BID) | ORAL | 11 refills | Status: DC

## 2023-04-14 NOTE — Progress Notes (Unsigned)
04/14/2023 3:31 PM   [name] Lesli Hughes 1941-08-07 [id number]  Referring provider: Tresa Garter, MD [address] [place],  [place] [address]  Followup prostate cancer and BPH  HPI: [name] is a 81yo here for followup for prostate cancer and BPH. PSA decreased to 0.3. IPSS 14 QOL 4 on flomax 0.4mg  Urine stream fair. He has nocturia 4x. No straining to urinate. He has post void dribbling. No other complaints today   PMH: Past Medical History:  Diagnosis Date   Bilateral carotid bruits    US carotid bilateral 05-14-2022 epic   Chronic kidney disease stage 4    Colon polyps    Coronary artery calcification seen on CT scan    Hyperlipidemia    Hypertension    per pt on 08-25-2022 off amlodipine last 3 to 4 months per nephrology dr Cherie Ouch   Osteoarthritis    Prostate cancer [facility])    RBBB     Surgical History: Past Surgical History:  Procedure Laterality Date   COLONOSCOPY     CYSTOSCOPY N/A 09/03/2022   Procedure: CYSTOSCOPY;  Surgeon: Malen Gauze, MD;  Location: [facility];  Service: Urology;  Laterality: N/A;   INGUINAL HERNIA REPAIR  2005   Right   LESION REMOVAL Left 09/23/2020   Procedure: MINOR EXICISION OF LESION OF LEFT EAR;  Surgeon: Serena Colonel, MD;  Location: [facility];  Service: ENT;  Laterality: Left;   PARTIAL COLECTOMY Right 12/16/2015   Procedure: PARTIAL CECECTOMY AND APPENDECTOMY;  Surgeon: Jimmye Norman, MD;  Location: [facility];  Service: General;  Laterality: Right;   POLYPECTOMY     RADIOACTIVE SEED IMPLANT N/A 09/03/2022   Procedure: RADIOACTIVE SEED IMPLANT/BRACHYTHERAPY IMPLANT;  Surgeon: Malen Gauze, MD;  Location: [facility];  Service: Urology;  Laterality: N/A;   SPACE OAR INSTILLATION N/A 09/03/2022   Procedure: SPACE OAR INSTILLATION;  Surgeon: Malen Gauze, MD;  Location: [facility];  Service: Urology;  Laterality: N/A;   VASECTOMY      Home  Medications:  Allergies as of 04/14/2023       Reactions   Bupropion Hcl    REACTION: extreme dizziness   Lovastatin    myalgias   Uroxatral [alfuzosin]    Weakness, tinnitus        Medication List        Accurate as of Apr 14, 2023  3:31 PM. If you have any questions, ask your nurse or doctor.          Fish Oil 1000 MG Caps 1,200 mg daily at 2 PM.   Glucosamine 1500 Complex Caps Take by mouth daily at 2 PM.   pravastatin 20 MG tablet Commonly known as: PRAVACHOL TAKE 1 TABLET EVERY DAY   tamsulosin 0.4 MG Caps capsule Commonly known as: FLOMAX Take 1 capsule (0.4 mg total) by mouth 2 (two) times daily.   Vitamin D 50 MCG (2000 UT) Caps Take 1 capsule by mouth daily.        Allergies:  Allergies  Allergen Reactions   Bupropion Hcl     REACTION: extreme dizziness   Lovastatin     myalgias   Uroxatral [Alfuzosin]     Weakness, tinnitus    Family History: Family History  Problem Relation Age of Onset   Diverticulitis Mother    Hypertension Father    Cancer Father        brain ca   Kidney disease Brother 102  Colon cancer Brother    Rectal cancer Neg Hx    Stomach cancer Neg Hx    Esophageal cancer Neg Hx     Social History:  reports that he quit smoking about 57 years ago. His smoking use included cigarettes. He has a 5.00 pack-year smoking history. He has never used smokeless tobacco. He reports that he does not currently use alcohol. He reports that he does not use drugs.  ROS: All other review of systems were reviewed and are negative except what is noted above in HPI  Physical Exam: BP (!) 109/58   Pulse (!) 59   Constitutional:  Alert and oriented, No acute distress. HEENT: [place] AT, moist mucus membranes.  Trachea midline, no masses. Cardiovascular: No clubbing, cyanosis, or edema. Respiratory: Normal respiratory effort, no increased work of breathing. GI: Abdomen is soft, nontender, nondistended, no abdominal masses GU: No CVA  tenderness.  Lymph: No cervical or inguinal lymphadenopathy. Skin: No rashes, bruises or suspicious lesions. Neurologic: Grossly intact, no focal deficits, moving all 4 extremities. Psychiatric: Normal mood and affect.  Laboratory Data: Lab Results  Component Value Date   WBC 8.2 11/02/2022   HGB 11.1 (A) 11/02/2022   HCT 33 (A) 11/02/2022   MCV 90 08/28/2022   PLT 242 11/02/2022    Lab Results  Component Value Date   CREATININE 3.09 (H) 12/16/2022    Lab Results  Component Value Date   PSA 11.03 (H) 10/23/2021   PSA 6.35 (H) 09/29/2017   PSA 6.51 (H) 05/24/2017    No results found for: "TESTOSTERONE"  Lab Results  Component Value Date   HGBA1C 5.8 (H) 12/09/2015    Urinalysis    Component Value Date/Time   COLORURINE YELLOW 09/14/2022 1410   APPEARANCEUR Clear 01/05/2023 1121   LABSPEC 1.010 09/14/2022 1410   PHURINE 5.5 09/14/2022 1410   GLUCOSEU Negative 01/05/2023 1121   GLUCOSEU NEGATIVE 09/14/2022 1410   HGBUR MODERATE (A) 09/14/2022 1410   BILIRUBINUR Negative 01/05/2023 1121   KETONESUR NEGATIVE 09/14/2022 1410   PROTEINUR 1+ (A) 01/05/2023 1121   UROBILINOGEN 0.2 09/14/2022 1410   NITRITE Negative 01/05/2023 1121   NITRITE POSITIVE (A) 09/14/2022 1410   LEUKOCYTESUR Negative 01/05/2023 1121   LEUKOCYTESUR LARGE (A) 09/14/2022 1410    Lab Results  Component Value Date   LABMICR See below: 01/05/2023   WBCUA 0-5 01/05/2023   LABEPIT 0-10 01/05/2023   MUCUS Present 06/12/2022   BACTERIA None seen 01/05/2023    Pertinent Imaging:  No results found for this or any previous visit.  No results found for this or any previous visit.  No results found for this or any previous visit.  No results found for this or any previous visit.  Results for orders placed during the hospital encounter of 05/14/22  US RENAL  Narrative CLINICAL DATA:  Chronic renal disease  EXAM: RENAL / URINARY TRACT ULTRASOUND COMPLETE  COMPARISON:  Renal  ultrasound 05/10/2020  FINDINGS: Right Kidney:  Renal measurements: 8.5 x 4.0 x 3.8 cm = volume: 67.2 mL. Renal cortical thinning. Increased cortical echogenicity. No hydronephrosis.  Left Kidney:  Renal measurements: 9.5 x 4.6 x 4.2 cm = volume: 94.2 mL. Renal cortical thinning. Increased renal cortical echogenicity. No hydronephrosis. Probable 3 mm stone inferior pole.  Bladder:  Appears normal for degree of bladder distention.  Other:  None.  IMPRESSION: No hydronephrosis.  Increased cortical echogenicity compatible with chronic medical renal disease.   Electronically Signed By: Annia Belt M.D. On: 05/14/2022  15:05  No valid procedures specified. No results found for this or any previous visit.  No results found for this or any previous visit.   Assessment & Plan:    1. Prostate cancer (HCC) Followup 3 months with a PSA - Urinalysis, Routine w reflex microscopic  2. Benign prostatic hyperplasia with nocturia -continue flomax 0.4mg  BID  3. Nocturia Continue flomax 0.4mg  BID   No follow-ups on file.  Wilkie Aye, MD  [facility]

## 2023-04-14 NOTE — Patient Instructions (Signed)

## 2023-04-15 ENCOUNTER — Encounter: Payer: Self-pay | Admitting: Urology

## 2023-04-15 LAB — URINALYSIS, ROUTINE W REFLEX MICROSCOPIC
Bilirubin, UA: NEGATIVE
Glucose, UA: NEGATIVE
Ketones, UA: NEGATIVE
Leukocytes,UA: NEGATIVE
Nitrite, UA: NEGATIVE
Specific Gravity, UA: 1.015 (ref 1.005–1.030)
Urobilinogen, Ur: 0.2 mg/dL (ref 0.2–1.0)
pH, UA: 5.5 (ref 5.0–7.5)

## 2023-04-15 LAB — MICROSCOPIC EXAMINATION: Bacteria, UA: NONE SEEN

## 2023-04-21 NOTE — Progress Notes (Signed)
Agree with the assessment and plan as outlined by Amy Esterwood, PA-C.  Shamecka Hocutt, DO, FACG  

## 2023-04-27 DIAGNOSIS — N184 Chronic kidney disease, stage 4 (severe): Secondary | ICD-10-CM | POA: Diagnosis not present

## 2023-05-03 DIAGNOSIS — N184 Chronic kidney disease, stage 4 (severe): Secondary | ICD-10-CM | POA: Diagnosis not present

## 2023-05-03 DIAGNOSIS — N2581 Secondary hyperparathyroidism of renal origin: Secondary | ICD-10-CM | POA: Diagnosis not present

## 2023-05-03 DIAGNOSIS — I129 Hypertensive chronic kidney disease with stage 1 through stage 4 chronic kidney disease, or unspecified chronic kidney disease: Secondary | ICD-10-CM | POA: Diagnosis not present

## 2023-05-03 DIAGNOSIS — D631 Anemia in chronic kidney disease: Secondary | ICD-10-CM | POA: Diagnosis not present

## 2023-05-03 DIAGNOSIS — R809 Proteinuria, unspecified: Secondary | ICD-10-CM | POA: Diagnosis not present

## 2023-06-12 ENCOUNTER — Encounter: Payer: Self-pay | Admitting: Urology

## 2023-06-12 NOTE — Telephone Encounter (Signed)
See patient request, something other than tamsulosin that you had mentioned to him

## 2023-06-23 ENCOUNTER — Encounter (INDEPENDENT_AMBULATORY_CARE_PROVIDER_SITE_OTHER): Payer: Self-pay

## 2023-06-28 DIAGNOSIS — N184 Chronic kidney disease, stage 4 (severe): Secondary | ICD-10-CM | POA: Diagnosis not present

## 2023-07-05 DIAGNOSIS — N184 Chronic kidney disease, stage 4 (severe): Secondary | ICD-10-CM | POA: Diagnosis not present

## 2023-07-05 DIAGNOSIS — I129 Hypertensive chronic kidney disease with stage 1 through stage 4 chronic kidney disease, or unspecified chronic kidney disease: Secondary | ICD-10-CM | POA: Diagnosis not present

## 2023-07-05 DIAGNOSIS — N2581 Secondary hyperparathyroidism of renal origin: Secondary | ICD-10-CM | POA: Diagnosis not present

## 2023-07-05 DIAGNOSIS — D631 Anemia in chronic kidney disease: Secondary | ICD-10-CM | POA: Diagnosis not present

## 2023-07-05 DIAGNOSIS — R809 Proteinuria, unspecified: Secondary | ICD-10-CM | POA: Diagnosis not present

## 2023-07-14 ENCOUNTER — Ambulatory Visit (INDEPENDENT_AMBULATORY_CARE_PROVIDER_SITE_OTHER): Payer: Medicare HMO | Admitting: Internal Medicine

## 2023-07-14 ENCOUNTER — Encounter: Payer: Self-pay | Admitting: Internal Medicine

## 2023-07-14 VITALS — BP 118/70 | HR 60 | Temp 98.0°F | Ht 62.0 in | Wt 138.0 lb

## 2023-07-14 DIAGNOSIS — I10 Essential (primary) hypertension: Secondary | ICD-10-CM | POA: Diagnosis not present

## 2023-07-14 DIAGNOSIS — R351 Nocturia: Secondary | ICD-10-CM | POA: Diagnosis not present

## 2023-07-14 DIAGNOSIS — N184 Chronic kidney disease, stage 4 (severe): Secondary | ICD-10-CM | POA: Diagnosis not present

## 2023-07-14 DIAGNOSIS — I2510 Atherosclerotic heart disease of native coronary artery without angina pectoris: Secondary | ICD-10-CM

## 2023-07-14 DIAGNOSIS — M533 Sacrococcygeal disorders, not elsewhere classified: Secondary | ICD-10-CM | POA: Diagnosis not present

## 2023-07-14 DIAGNOSIS — I2583 Coronary atherosclerosis due to lipid rich plaque: Secondary | ICD-10-CM

## 2023-07-14 DIAGNOSIS — N401 Enlarged prostate with lower urinary tract symptoms: Secondary | ICD-10-CM

## 2023-07-14 DIAGNOSIS — C61 Malignant neoplasm of prostate: Secondary | ICD-10-CM | POA: Diagnosis not present

## 2023-07-14 MED ORDER — TAMSULOSIN HCL 0.4 MG PO CAPS: 0.4000 mg | ORAL_CAPSULE | Freq: Two times a day (BID) | ORAL | Status: DC

## 2023-07-14 NOTE — Assessment & Plan Note (Signed)
Cont on Pravastatin  F/u w/Dr Simona Huh

## 2023-07-14 NOTE — Progress Notes (Signed)
Subjective:  Patient ID: Sergio Hughes, male    DOB: [birth year]  Age: 82 y.o. MRN: [id number]  CC: Follow-up (4 mnth f/u)   HPI Sergio Hughes presents for CRI, HTN, CAD Sergio Hughes needs 24 h care now C/o LBP - R SI joint  Outpatient Medications Prior to Visit  Medication Sig Dispense Refill   Cholecalciferol (VITAMIN D) 2000 UNITS CAPS Take 1 capsule by mouth daily.     cyanocobalamin (VITAMIN B12) 1000 MCG tablet      Glucosamine-Chondroit-Vit C-Mn (GLUCOSAMINE 1500 COMPLEX) CAPS Take by mouth daily at 2 PM.     Omega-3 Fatty Acids (FISH OIL) 1000 MG CAPS 1,200 mg daily at 2 PM.     pravastatin (PRAVACHOL) 20 MG tablet TAKE 1 TABLET EVERY DAY 90 tablet 3   triamcinolone cream (KENALOG) 0.1 % SMARTSIG:1 Application Topical 2-3 Times Daily     tamsulosin (FLOMAX) 0.4 MG CAPS capsule Take 1 capsule (0.4 mg total) by mouth 2 (two) times daily. 60 capsule 11   No facility-administered medications prior to visit.    ROS: Review of Systems  Constitutional:  Negative for appetite change, fatigue and unexpected weight change.  HENT:  Negative for congestion, nosebleeds, sneezing, sore throat and trouble swallowing.   Eyes:  Negative for itching and visual disturbance.  Respiratory:  Negative for cough.   Cardiovascular:  Negative for chest pain, palpitations and leg swelling.  Gastrointestinal:  Negative for abdominal distention, blood in stool, diarrhea and nausea.  Genitourinary:  Negative for frequency and hematuria.  Musculoskeletal:  Negative for back pain, gait problem, joint swelling and neck pain.  Skin:  Negative for rash.  Neurological:  Negative for dizziness, tremors, speech difficulty and weakness.  Psychiatric/Behavioral:  Negative for agitation, dysphoric mood and sleep disturbance. The patient is not nervous/anxious.     Objective:  BP 118/70 (BP Location: Right Arm, Patient Position: Sitting, Cuff Size: Large)   Pulse 60   Temp 98 F (36.7 C) (Oral)   Ht 5\' 2"  (1.575 m)    Wt 138 lb (62.6 kg)   SpO2 99%   BMI 25.24 kg/m   BP Readings from Last 3 Encounters:  07/14/23 118/70  04/14/23 (!) 109/58  04/02/23 116/60    Wt Readings from Last 3 Encounters:  07/14/23 138 lb (62.6 kg)  04/02/23 138 lb 8 oz (62.8 kg)  03/30/23 139 lb 9.6 oz (63.3 kg)    Physical Exam Constitutional:      General: He is not in acute distress.    Appearance: He is well-developed.     Comments: NAD  Eyes:     Conjunctiva/sclera: Conjunctivae normal.     Pupils: Pupils are equal, round, and reactive to light.  Neck:     Thyroid: No thyromegaly.     Vascular: No JVD.  Cardiovascular:     Rate and Rhythm: Normal rate and regular rhythm.     Heart sounds: Normal heart sounds. No murmur heard.    No friction rub. No gallop.  Pulmonary:     Effort: Pulmonary effort is normal. No respiratory distress.     Breath sounds: Normal breath sounds. No wheezing or rales.  Chest:     Chest wall: No tenderness.  Abdominal:     General: Bowel sounds are normal. There is no distension.     Palpations: Abdomen is soft. There is no mass.     Tenderness: There is no abdominal tenderness. There is no guarding or rebound.  Musculoskeletal:  General: No tenderness. Normal range of motion.     Cervical back: Normal range of motion.  Lymphadenopathy:     Cervical: No cervical adenopathy.  Skin:    General: Skin is warm and dry.     Findings: No rash.  Neurological:     Mental Status: He is alert and oriented to person, place, and time.     Cranial Nerves: No cranial nerve deficit.     Motor: No abnormal muscle tone.     Coordination: Coordination normal.     Gait: Gait normal.     Deep Tendon Reflexes: Reflexes are normal and symmetric.  Psychiatric:        Behavior: Behavior normal.        Thought Content: Thought content normal.        Judgment: Judgment normal.   LS w/pain R SI w/pain  Lab Results  Component Value Date   WBC 8.2 11/02/2022   HGB 11.1 (A)  11/02/2022   HCT 33 (A) 11/02/2022   PLT 242 11/02/2022   GLUCOSE 128 (H) 12/16/2022   CHOL 144 04/01/2023   TRIG 44 04/01/2023   HDL 72 04/01/2023   LDLDIRECT 144.9 11/14/2013   LDLCALC 62 04/01/2023   ALT 15 10/23/2021   AST 22 10/23/2021   NA 136 12/16/2022   K 4.5 12/16/2022   CL 102 12/16/2022   CREATININE 3.09 (H) 12/16/2022   BUN 78 (H) 12/16/2022   CO2 27 12/16/2022   TSH 0.59 10/23/2021   PSA 11.03 (H) 10/23/2021   INR 1.02 12/09/2015   HGBA1C 5.8 (H) 12/09/2015    MM DIAG BREAST TOMO BILATERAL  Result Date: 02/05/2023 CLINICAL DATA:  82 year old male with intermittent tenderness of the left nipple for 1-2 months. EXAM: DIGITAL DIAGNOSTIC BILATERAL MAMMOGRAM WITH TOMOSYNTHESIS TECHNIQUE: Bilateral digital diagnostic mammography and breast tomosynthesis was performed. COMPARISON:  Previous exam(s). ACR Breast Density Category a: The breasts are almost entirely fatty. FINDINGS: Sergio Hughes shaped fibroglandular tissue is seen originating from the left nipple and extending posteriorly. This is consistent with mild left-sided gynecomastia. Otherwise, no suspicious findings in either breast. IMPRESSION: 1. Mild left-sided gynecomastia counting for the patient's clinical symptoms. 2. No mammographic evidence of malignancy in either breast. RECOMMENDATION: I discussed with the patient the fact that gynecomastia can occur in older men as testosterone levels decrease with age or in younger men with low testosterone levels, causing a change in the serum testosterone:estrogen ratio. We also discussed other potential etiologies of gynecomastia including numerous prescription medications. We also discussed the possibility of surgical excision if symptoms continue and if an etiology of the gynecomastia cannot be determined and therefore corrected. I have discussed the findings and recommendations with the patient. If applicable, a reminder letter will be sent to the patient regarding the next  appointment. BI-RADS CATEGORY  2: Benign. Electronically Signed   By: Sande Brothers M.D.   On: 02/05/2023 13:27   Assessment & Plan:   Problem List Items Addressed This Visit     Essential hypertension - Primary    On NAS      CRF (chronic renal failure), stage 4 (severe) (HCC)    F/u w/Dr Calodonato q 3 months Off Rapaflo On Flomax for BPH       CAD (coronary artery disease)    Cont on Pravastatin  F/u w/Dr Simona Huh      Malignant neoplasm of prostate [facility])    Seeing Dr Ronne Binning q 6 months      SI (  sacroiliac) pain    R SI pain Chiropractic ref was offered      Other Visit Diagnoses     Benign prostatic hyperplasia with nocturia             Meds ordered this encounter  Medications   tamsulosin (FLOMAX) 0.4 MG CAPS capsule    Sig: Take 1 capsule (0.4 mg total) by mouth 2 (two) times daily.      Follow-up: Return in about 4 months (around 11/13/2023) for f/u with PCP.  Sonda Primes, MD

## 2023-07-14 NOTE — Assessment & Plan Note (Signed)
Seeing Dr Ronne Binning q 6 months

## 2023-07-14 NOTE — Assessment & Plan Note (Signed)
On NAS

## 2023-07-14 NOTE — Assessment & Plan Note (Addendum)
F/u w/Dr Calodonato q 3 months Off Rapaflo On Flomax for BPH

## 2023-07-14 NOTE — Assessment & Plan Note (Signed)
R SI pain Chiropractic ref was offered

## 2023-07-15 ENCOUNTER — Other Ambulatory Visit: Payer: Medicare HMO

## 2023-07-15 ENCOUNTER — Encounter: Payer: Self-pay | Admitting: Internal Medicine

## 2023-07-15 DIAGNOSIS — C61 Malignant neoplasm of prostate: Secondary | ICD-10-CM

## 2023-07-16 LAB — PSA: Prostate Specific Ag, Serum: 0.2 ng/mL (ref 0.0–4.0)

## 2023-07-18 ENCOUNTER — Other Ambulatory Visit: Payer: Self-pay | Admitting: Internal Medicine

## 2023-07-18 DIAGNOSIS — M159 Polyosteoarthritis, unspecified: Secondary | ICD-10-CM

## 2023-07-18 DIAGNOSIS — M533 Sacrococcygeal disorders, not elsewhere classified: Secondary | ICD-10-CM

## 2023-07-21 ENCOUNTER — Ambulatory Visit: Payer: Medicare HMO | Admitting: Urology

## 2023-07-21 ENCOUNTER — Encounter: Payer: Self-pay | Admitting: Urology

## 2023-07-21 VITALS — BP 112/65 | HR 65

## 2023-07-21 DIAGNOSIS — R351 Nocturia: Secondary | ICD-10-CM | POA: Diagnosis not present

## 2023-07-21 DIAGNOSIS — N401 Enlarged prostate with lower urinary tract symptoms: Secondary | ICD-10-CM

## 2023-07-21 DIAGNOSIS — C61 Malignant neoplasm of prostate: Secondary | ICD-10-CM

## 2023-07-21 LAB — URINALYSIS, ROUTINE W REFLEX MICROSCOPIC
Bilirubin, UA: NEGATIVE
Glucose, UA: NEGATIVE
Ketones, UA: NEGATIVE
Leukocytes,UA: NEGATIVE
Nitrite, UA: NEGATIVE
Specific Gravity, UA: 1.01 (ref 1.005–1.030)
Urobilinogen, Ur: 0.2 mg/dL (ref 0.2–1.0)
pH, UA: 6 (ref 5.0–7.5)

## 2023-07-21 LAB — MICROSCOPIC EXAMINATION
Bacteria, UA: NONE SEEN
WBC, UA: NONE SEEN /hpf (ref 0–5)

## 2023-07-21 MED ORDER — TAMSULOSIN HCL 0.4 MG PO CAPS: 0.4000 mg | ORAL_CAPSULE | Freq: Two times a day (BID) | ORAL | 3 refills | Status: DC

## 2023-07-21 NOTE — Patient Instructions (Signed)

## 2023-07-21 NOTE — Progress Notes (Signed)
07/21/2023 2:45 PM   [name] Lesli Hughes 1941-03-11 [id number]  Referring provider: Tresa Garter, MD [address] [place],  [place] [address]  Followup prostate cancer and BPH   HPI: [name] is a 82yo here for followup for BPH and prostate cancer. PSA decreased to 0.2. IPSS 10 QOL 4 on flomax 0.4mg  BID. He has nocturia 3-4x but he drinks 20oz with in 2 hours of going to bed. Urine stream strong. No straining to urinate. He has intermittent post void dribbling. He has CKD4.    PMH: Past Medical History:  Diagnosis Date   Bilateral carotid bruits    US carotid bilateral 05-14-2022 epic   Chronic kidney disease stage 4    Colon polyps    Coronary artery calcification seen on CT scan    Hyperlipidemia    Hypertension    per pt on 08-25-2022 off amlodipine last 3 to 4 months per nephrology dr Cherie Ouch   Osteoarthritis    Prostate cancer [facility])    RBBB     Surgical History: Past Surgical History:  Procedure Laterality Date   COLONOSCOPY     CYSTOSCOPY N/A 09/03/2022   Procedure: CYSTOSCOPY;  Surgeon: Malen Gauze, MD;  Location: [facility];  Service: Urology;  Laterality: N/A;   INGUINAL HERNIA REPAIR  2005   Right   LESION REMOVAL Left 09/23/2020   Procedure: MINOR EXICISION OF LESION OF LEFT EAR;  Surgeon: Serena Colonel, MD;  Location: [facility];  Service: ENT;  Laterality: Left;   PARTIAL COLECTOMY Right 12/16/2015   Procedure: PARTIAL CECECTOMY AND APPENDECTOMY;  Surgeon: Jimmye Norman, MD;  Location: [facility];  Service: General;  Laterality: Right;   POLYPECTOMY     RADIOACTIVE SEED IMPLANT N/A 09/03/2022   Procedure: RADIOACTIVE SEED IMPLANT/BRACHYTHERAPY IMPLANT;  Surgeon: Malen Gauze, MD;  Location: [facility];  Service: Urology;  Laterality: N/A;   SPACE OAR INSTILLATION N/A 09/03/2022   Procedure: SPACE OAR INSTILLATION;  Surgeon: Malen Gauze, MD;  Location: [facility];  Service:  Urology;  Laterality: N/A;   VASECTOMY      Home Medications:  Allergies as of 07/21/2023       Reactions   Bupropion Hcl    REACTION: extreme dizziness   Lovastatin    myalgias   Uroxatral [alfuzosin]    Weakness, tinnitus        Medication List        Accurate as of July 21, 2023  2:45 PM. If you have any questions, ask your nurse or doctor.          cyanocobalamin 1000 MCG tablet Commonly known as: VITAMIN B12   Fish Oil 1000 MG Caps 1,200 mg daily at 2 PM.   Glucosamine 1500 Complex Caps Take by mouth daily at 2 PM.   pravastatin 20 MG tablet Commonly known as: PRAVACHOL TAKE 1 TABLET EVERY DAY   tamsulosin 0.4 MG Caps capsule Commonly known as: FLOMAX Take 1 capsule (0.4 mg total) by mouth 2 (two) times daily.   triamcinolone cream 0.1 % Commonly known as: KENALOG SMARTSIG:1 Application Topical 2-3 Times Daily   Vitamin D 50 MCG (2000 UT) Caps Take 1 capsule by mouth daily.        Allergies:  Allergies  Allergen Reactions   Bupropion Hcl     REACTION: extreme dizziness   Lovastatin     myalgias   Uroxatral [Alfuzosin]     Weakness, tinnitus  Family History: Family History  Problem Relation Age of Onset   Diverticulitis Mother    Hypertension Father    Cancer Father        brain ca   Kidney disease Brother 9   Colon cancer Brother    Rectal cancer Neg Hx    Stomach cancer Neg Hx    Esophageal cancer Neg Hx     Social History:  reports that he quit smoking about 57 years ago. His smoking use included cigarettes. He started smoking about 67 years ago. He has a 5 pack-year smoking history. He has never used smokeless tobacco. He reports that he does not currently use alcohol. He reports that he does not use drugs.  ROS: All other review of systems were reviewed and are negative except what is noted above in HPI  Physical Exam: BP 112/65   Pulse 65   Constitutional:  Alert and oriented, No acute distress. HEENT: [place] AT,  moist mucus membranes.  Trachea midline, no masses. Cardiovascular: No clubbing, cyanosis, or edema. Respiratory: Normal respiratory effort, no increased work of breathing. GI: Abdomen is soft, nontender, nondistended, no abdominal masses GU: No CVA tenderness.  Lymph: No cervical or inguinal lymphadenopathy. Skin: No rashes, bruises or suspicious lesions. Neurologic: Grossly intact, no focal deficits, moving all 4 extremities. Psychiatric: Normal mood and affect.  Laboratory Data: Lab Results  Component Value Date   WBC 8.2 11/02/2022   HGB 11.1 (A) 11/02/2022   HCT 33 (A) 11/02/2022   MCV 90 08/28/2022   PLT 242 11/02/2022    Lab Results  Component Value Date   CREATININE 3.09 (H) 12/16/2022    Lab Results  Component Value Date   PSA 11.03 (H) 10/23/2021   PSA 6.35 (H) 09/29/2017   PSA 6.51 (H) 05/24/2017    No results found for: "TESTOSTERONE"  Lab Results  Component Value Date   HGBA1C 5.8 (H) 12/09/2015    Urinalysis    Component Value Date/Time   COLORURINE YELLOW 09/14/2022 1410   APPEARANCEUR Clear 04/14/2023 1523   LABSPEC 1.010 09/14/2022 1410   PHURINE 5.5 09/14/2022 1410   GLUCOSEU Negative 04/14/2023 1523   GLUCOSEU NEGATIVE 09/14/2022 1410   HGBUR MODERATE (A) 09/14/2022 1410   BILIRUBINUR Negative 04/14/2023 1523   KETONESUR NEGATIVE 09/14/2022 1410   PROTEINUR Trace 04/14/2023 1523   UROBILINOGEN 0.2 09/14/2022 1410   NITRITE Negative 04/14/2023 1523   NITRITE POSITIVE (A) 09/14/2022 1410   LEUKOCYTESUR Negative 04/14/2023 1523   LEUKOCYTESUR LARGE (A) 09/14/2022 1410    Lab Results  Component Value Date   LABMICR See below: 04/14/2023   WBCUA 0-5 04/14/2023   LABEPIT 0-10 04/14/2023   MUCUS Present 06/12/2022   BACTERIA None seen 04/14/2023    Pertinent Imaging:  No results found for this or any previous visit.  No results found for this or any previous visit.  No results found for this or any previous visit.  No results  found for this or any previous visit.  Results for orders placed during the hospital encounter of 05/14/22  US RENAL  Narrative CLINICAL DATA:  Chronic renal disease  EXAM: RENAL / URINARY TRACT ULTRASOUND COMPLETE  COMPARISON:  Renal ultrasound 05/10/2020  FINDINGS: Right Kidney:  Renal measurements: 8.5 x 4.0 x 3.8 cm = volume: 67.2 mL. Renal cortical thinning. Increased cortical echogenicity. No hydronephrosis.  Left Kidney:  Renal measurements: 9.5 x 4.6 x 4.2 cm = volume: 94.2 mL. Renal cortical thinning. Increased renal cortical echogenicity. No hydronephrosis.  Probable 3 mm stone inferior pole.  Bladder:  Appears normal for degree of bladder distention.  Other:  None.  IMPRESSION: No hydronephrosis.  Increased cortical echogenicity compatible with chronic medical renal disease.   Electronically Signed By: Annia Belt M.D. On: 05/14/2022 15:05  No valid procedures specified. No results found for this or any previous visit.  No results found for this or any previous visit.   Assessment & Plan:    1. Prostate cancer (HCC) -followup 6 months with PSA - Urinalysis, Routine w reflex microscopic  2. Benign prostatic hyperplasia with nocturia -continue flomax 0.4mg  BID  3. Nocturia Decrease fluid intake within 2 hours of going to bed   No follow-ups on file.  Wilkie Aye, MD  [facility] [place]

## 2023-08-24 DIAGNOSIS — N184 Chronic kidney disease, stage 4 (severe): Secondary | ICD-10-CM | POA: Diagnosis not present

## 2023-09-06 DIAGNOSIS — D631 Anemia in chronic kidney disease: Secondary | ICD-10-CM | POA: Diagnosis not present

## 2023-09-06 DIAGNOSIS — N2581 Secondary hyperparathyroidism of renal origin: Secondary | ICD-10-CM | POA: Diagnosis not present

## 2023-09-06 DIAGNOSIS — I129 Hypertensive chronic kidney disease with stage 1 through stage 4 chronic kidney disease, or unspecified chronic kidney disease: Secondary | ICD-10-CM | POA: Diagnosis not present

## 2023-09-06 DIAGNOSIS — R809 Proteinuria, unspecified: Secondary | ICD-10-CM | POA: Diagnosis not present

## 2023-09-06 DIAGNOSIS — N184 Chronic kidney disease, stage 4 (severe): Secondary | ICD-10-CM | POA: Diagnosis not present

## 2023-09-29 DIAGNOSIS — N184 Chronic kidney disease, stage 4 (severe): Secondary | ICD-10-CM | POA: Diagnosis not present

## 2023-11-01 DIAGNOSIS — N184 Chronic kidney disease, stage 4 (severe): Secondary | ICD-10-CM | POA: Diagnosis not present

## 2023-11-08 DIAGNOSIS — I129 Hypertensive chronic kidney disease with stage 1 through stage 4 chronic kidney disease, or unspecified chronic kidney disease: Secondary | ICD-10-CM | POA: Diagnosis not present

## 2023-11-08 DIAGNOSIS — N189 Chronic kidney disease, unspecified: Secondary | ICD-10-CM | POA: Diagnosis not present

## 2023-11-08 DIAGNOSIS — N184 Chronic kidney disease, stage 4 (severe): Secondary | ICD-10-CM | POA: Diagnosis not present

## 2023-11-08 DIAGNOSIS — N2581 Secondary hyperparathyroidism of renal origin: Secondary | ICD-10-CM | POA: Diagnosis not present

## 2023-11-08 DIAGNOSIS — D631 Anemia in chronic kidney disease: Secondary | ICD-10-CM | POA: Diagnosis not present

## 2023-11-08 DIAGNOSIS — R809 Proteinuria, unspecified: Secondary | ICD-10-CM | POA: Diagnosis not present

## 2023-11-15 ENCOUNTER — Ambulatory Visit: Payer: Medicare HMO | Admitting: Internal Medicine

## 2023-11-18 ENCOUNTER — Ambulatory Visit: Payer: Medicare HMO | Admitting: Internal Medicine

## 2023-11-18 VITALS — BP 102/62 | HR 65 | Temp 98.3°F | Ht 62.0 in | Wt 136.0 lb

## 2023-11-18 DIAGNOSIS — E785 Hyperlipidemia, unspecified: Secondary | ICD-10-CM | POA: Diagnosis not present

## 2023-11-18 DIAGNOSIS — I2583 Coronary atherosclerosis due to lipid rich plaque: Secondary | ICD-10-CM

## 2023-11-18 DIAGNOSIS — M150 Primary generalized (osteo)arthritis: Secondary | ICD-10-CM

## 2023-11-18 DIAGNOSIS — N184 Chronic kidney disease, stage 4 (severe): Secondary | ICD-10-CM | POA: Diagnosis not present

## 2023-11-18 DIAGNOSIS — I10 Essential (primary) hypertension: Secondary | ICD-10-CM | POA: Diagnosis not present

## 2023-11-18 DIAGNOSIS — I2510 Atherosclerotic heart disease of native coronary artery without angina pectoris: Secondary | ICD-10-CM | POA: Diagnosis not present

## 2023-11-18 NOTE — Assessment & Plan Note (Signed)
Tramadol prn 

## 2023-11-18 NOTE — Assessment & Plan Note (Signed)
Cont with Pravastatin

## 2023-11-18 NOTE — Assessment & Plan Note (Signed)
Cont on Pravastatin  F/u w/Dr Simona Huh

## 2023-11-18 NOTE — Progress Notes (Signed)
Subjective:  Patient ID: Sergio Hughes, male    DOB: 03/28/1941  Age: 82 y.o. MRN: [id number]  CC: Medical Management of Chronic Issues (4 MNTH F/U)   HPI Sergio Hughes presents for HTN, CRF, CAD  Outpatient Medications Prior to Visit  Medication Sig Dispense Refill   Cholecalciferol (VITAMIN D) 2000 UNITS CAPS Take 1 capsule by mouth daily.     cyanocobalamin (VITAMIN B12) 1000 MCG tablet      Glucosamine-Chondroit-Vit C-Mn (GLUCOSAMINE 1500 COMPLEX) CAPS Take by mouth daily at 2 PM.     Omega-3 Fatty Acids (FISH OIL) 1000 MG CAPS 1,200 mg daily at 2 PM.     pravastatin (PRAVACHOL) 20 MG tablet TAKE 1 TABLET EVERY DAY 90 tablet 3   tamsulosin (FLOMAX) 0.4 MG CAPS capsule Take 1 capsule (0.4 mg total) by mouth 2 (two) times daily. 180 capsule 3   triamcinolone cream (KENALOG) 0.1 % SMARTSIG:1 Application Topical 2-3 Times Daily     No facility-administered medications prior to visit.    ROS: Review of Systems  Constitutional:  Positive for fatigue. Negative for appetite change and unexpected weight change.  HENT:  Negative for congestion, nosebleeds, sneezing, sore throat and trouble swallowing.   Eyes:  Negative for itching and visual disturbance.  Respiratory:  Negative for cough.   Cardiovascular:  Negative for chest pain, palpitations and leg swelling.  Gastrointestinal:  Negative for abdominal distention, blood in stool, diarrhea and nausea.  Genitourinary:  Negative for frequency and hematuria.  Musculoskeletal:  Negative for back pain, gait problem, joint swelling and neck pain.  Skin:  Negative for rash.  Neurological:  Negative for dizziness, tremors, speech difficulty and weakness.  Psychiatric/Behavioral:  Negative for agitation, dysphoric mood and sleep disturbance. The patient is not nervous/anxious.     Objective:  BP 102/62 (BP Location: Left Arm, Patient Position: Sitting, Cuff Size: Normal)   Pulse 65   Temp 98.3 F (36.8 C) (Oral)   Ht 5\' 2"  (1.575 m)   Wt  136 lb (61.7 kg)   SpO2 95%   BMI 24.87 kg/m   BP Readings from Last 3 Encounters:  11/18/23 102/62  07/21/23 112/65  07/14/23 118/70    Wt Readings from Last 3 Encounters:  11/18/23 136 lb (61.7 kg)  07/14/23 138 lb (62.6 kg)  04/02/23 138 lb 8 oz (62.8 kg)    Physical Exam Constitutional:      General: He is not in acute distress.    Appearance: Normal appearance. He is well-developed.     Comments: NAD  Eyes:     Conjunctiva/sclera: Conjunctivae normal.     Pupils: Pupils are equal, round, and reactive to light.  Neck:     Thyroid: No thyromegaly.     Vascular: No JVD.  Cardiovascular:     Rate and Rhythm: Normal rate and regular rhythm.     Heart sounds: Normal heart sounds. No murmur heard.    No friction rub. No gallop.  Pulmonary:     Effort: Pulmonary effort is normal. No respiratory distress.     Breath sounds: Normal breath sounds. No wheezing or rales.  Chest:     Chest wall: No tenderness.  Abdominal:     General: Bowel sounds are normal. There is no distension.     Palpations: Abdomen is soft. There is no mass.     Tenderness: There is no abdominal tenderness. There is no guarding or rebound.  Musculoskeletal:        General:  No tenderness. Normal range of motion.     Cervical back: Normal range of motion.     Right lower leg: No edema.     Left lower leg: No edema.  Lymphadenopathy:     Cervical: No cervical adenopathy.  Skin:    General: Skin is warm and dry.     Findings: No rash.  Neurological:     Mental Status: He is alert and oriented to person, place, and time.     Cranial Nerves: No cranial nerve deficit.     Motor: No abnormal muscle tone.     Coordination: Coordination normal.     Gait: Gait normal.     Deep Tendon Reflexes: Reflexes are normal and symmetric.  Psychiatric:        Behavior: Behavior normal.        Thought Content: Thought content normal.        Judgment: Judgment normal.     Lab Results  Component Value Date    WBC 8.2 11/02/2022   HGB 11.1 (A) 11/02/2022   HCT 33 (A) 11/02/2022   PLT 242 11/02/2022   GLUCOSE 128 (H) 12/16/2022   CHOL 144 04/01/2023   TRIG 44 04/01/2023   HDL 72 04/01/2023   LDLDIRECT 144.9 11/14/2013   LDLCALC 62 04/01/2023   ALT 15 10/23/2021   AST 22 10/23/2021   NA 136 12/16/2022   K 4.5 12/16/2022   CL 102 12/16/2022   CREATININE 3.09 (H) 12/16/2022   BUN 78 (H) 12/16/2022   CO2 27 12/16/2022   TSH 0.59 10/23/2021   PSA 11.03 (H) 10/23/2021   INR 1.02 12/09/2015   HGBA1C 5.8 (H) 12/09/2015    MM DIAG BREAST TOMO BILATERAL Result Date: 02/05/2023 CLINICAL DATA:  82 year old male with intermittent tenderness of the left nipple for 1-2 months. EXAM: DIGITAL DIAGNOSTIC BILATERAL MAMMOGRAM WITH TOMOSYNTHESIS TECHNIQUE: Bilateral digital diagnostic mammography and breast tomosynthesis was performed. COMPARISON:  Previous exam(s). ACR Breast Density Category a: The breasts are almost entirely fatty. FINDINGS: Sergio Hughes shaped fibroglandular tissue is seen originating from the left nipple and extending posteriorly. This is consistent with mild left-sided gynecomastia. Otherwise, no suspicious findings in either breast. IMPRESSION: 1. Mild left-sided gynecomastia counting for the patient's clinical symptoms. 2. No mammographic evidence of malignancy in either breast. RECOMMENDATION: I discussed with the patient the fact that gynecomastia can occur in older men as testosterone levels decrease with age or in younger men with low testosterone levels, causing a change in the serum testosterone:estrogen ratio. We also discussed other potential etiologies of gynecomastia including numerous prescription medications. We also discussed the possibility of surgical excision if symptoms continue and if an etiology of the gynecomastia cannot be determined and therefore corrected. I have discussed the findings and recommendations with the patient. If applicable, a reminder letter will be sent to the  patient regarding the next appointment. BI-RADS CATEGORY  2: Benign. Electronically Signed   By: Sande Brothers M.D.   On: 02/05/2023 13:27   Assessment & Plan:   Problem List Items Addressed This Visit     Dyslipidemia   Cont with Pravastatin       Essential hypertension   On NAS      Osteoarthritis   Tramadol prn      CRF (chronic renal failure), stage 4 (severe) (HCC) - Primary   F/u w/Dr Calodonato q 3 months On Flomax for BPH      CAD (coronary artery disease)   Cont on Pravastatin  F/u  w/Dr Simona Huh         No orders of the defined types were placed in this encounter.     Follow-up: Return in about 4 months (around 03/18/2024) for a follow-up visit.  Sonda Primes, MD

## 2023-11-18 NOTE — Assessment & Plan Note (Signed)
F/u w/Dr Calodonato q 3 months On Flomax for BPH

## 2023-11-18 NOTE — Assessment & Plan Note (Signed)
On NAS

## 2023-12-27 ENCOUNTER — Ambulatory Visit (INDEPENDENT_AMBULATORY_CARE_PROVIDER_SITE_OTHER): Payer: Medicare HMO

## 2023-12-27 VITALS — Ht 62.0 in | Wt 136.0 lb

## 2023-12-27 DIAGNOSIS — Z0000 Encounter for general adult medical examination without abnormal findings: Secondary | ICD-10-CM

## 2023-12-27 NOTE — Progress Notes (Cosign Needed Addendum)
Subjective:   Sergio Hughes is a 83 y.o. male who presents for Medicare Annual/Subsequent preventive examination.  Visit Complete: Virtual I connected with  Lorre Munroe on 12/27/23 by a audio enabled telemedicine application and verified that I am speaking with the correct person using two identifiers.  Patient Location: Home  Provider Location: Office/Clinic  I discussed the limitations of evaluation and management by telemedicine. The patient expressed understanding and agreed to proceed.  Vital Signs: Because this visit was a virtual/telehealth visit, some criteria may be missing or patient reported. Any vitals not documented were not able to be obtained and vitals that have been documented are patient reported.   Cardiac Risk Factors include: advanced age (>59men, >56 women);dyslipidemia;hypertension;male gender;Other (see comment), Risk factor comments: CAD     Objective:    Today's Vitals   12/27/23 1505  Weight: 136 lb (61.7 kg)  Height: 5\' 2"  (1.575 m)   Body mass index is 24.87 kg/m.     12/27/2023    3:09 PM 12/24/2022    3:11 PM 10/01/2022    2:58 PM 09/29/2022    2:21 PM 09/03/2022   11:49 AM 07/23/2022    2:03 PM 05/28/2022   12:25 PM  Advanced Directives  Does Patient Have a Medical Advance Directive? Yes Yes Yes No Yes Yes Yes  Type of Estate agent of [place];Living will Healthcare Power of [place];Living will Living will;Healthcare Power of [employer] Power of [place];Living will Living will;Healthcare Power of [employer] Power of [place];Living will  Does patient want to make changes to medical advance directive?     No - Patient declined    Copy of Healthcare Power of Attorney in Chart? No - copy requested No - copy requested   No - copy requested    Would patient like information on creating a medical advance directive?    No - Patient declined       Current Medications (verified) Outpatient Encounter Medications  as of 12/27/2023  Medication Sig   Cholecalciferol (VITAMIN D) 2000 UNITS CAPS Take 1 capsule by mouth daily.   cyanocobalamin (VITAMIN B12) 1000 MCG tablet    Glucosamine-Chondroit-Vit C-Mn (GLUCOSAMINE 1500 COMPLEX) CAPS Take by mouth daily at 2 PM.   Omega-3 Fatty Acids (FISH OIL) 1000 MG CAPS 1,200 mg daily at 2 PM.   pravastatin (PRAVACHOL) 20 MG tablet TAKE 1 TABLET EVERY DAY   tamsulosin (FLOMAX) 0.4 MG CAPS capsule Take 1 capsule (0.4 mg total) by mouth 2 (two) times daily.   triamcinolone cream (KENALOG) 0.1 % SMARTSIG:1 Application Topical 2-3 Times Daily   No facility-administered encounter medications on file as of 12/27/2023.    Allergies (verified) Bupropion hcl, Lovastatin, and Uroxatral [alfuzosin]   History: Past Medical History:  Diagnosis Date   Bilateral carotid bruits    US carotid bilateral 05-14-2022 epic   Chronic kidney disease stage 4    Colon polyps    Coronary artery calcification seen on CT scan    Hyperlipidemia    Hypertension    per pt on 08-25-2022 off amlodipine last 3 to 4 months per nephrology dr Cherie Ouch   Osteoarthritis    Prostate cancer [facility])    RBBB    Past Surgical History:  Procedure Laterality Date   COLONOSCOPY     CYSTOSCOPY N/A 09/03/2022   Procedure: CYSTOSCOPY;  Surgeon: Malen Gauze, MD;  Location: [facility];  Service: Urology;  Laterality: N/A;   INGUINAL HERNIA REPAIR  2005   Right   LESION REMOVAL Left 09/23/2020   Procedure: MINOR EXICISION OF LESION OF LEFT EAR;  Surgeon: Serena Colonel, MD;  Location: [facility];  Service: ENT;  Laterality: Left;   PARTIAL COLECTOMY Right 12/16/2015   Procedure: PARTIAL CECECTOMY AND APPENDECTOMY;  Surgeon: Jimmye Norman, MD;  [facility];  Service: General;  Laterality: Right;   POLYPECTOMY     RADIOACTIVE SEED IMPLANT N/A 09/03/2022   Procedure: RADIOACTIVE SEED IMPLANT/BRACHYTHERAPY IMPLANT;  Surgeon: Malen Gauze, MD;  Location: [facility];  Service: Urology;  Laterality: N/A;   SPACE OAR INSTILLATION N/A 09/03/2022   Procedure: SPACE OAR INSTILLATION;  Surgeon: Malen Gauze, MD;  [facility];  Service: Urology;  Laterality: N/A;   VASECTOMY     Family History  Problem Relation Age of Onset   Diverticulitis Mother    Hypertension Father    Cancer Father        brain ca   Kidney disease Brother 52   Colon cancer Brother    Rectal cancer Neg Hx    Stomach cancer Neg Hx    Esophageal cancer Neg Hx    Social History   Socioeconomic History   Marital status: Married    Spouse name: Hong Moring   Number of children: 2   Years of education: college    Highest education level: Bachelor's degree (e.g., BA, AB, BS)  Occupational History   Occupation: retired  Tobacco Use   Smoking status: Former    Current packs/day: 0.00    Average packs/day: 0.5 packs/day for 10.0 years (5.0 ttl pk-yrs)    Types: Cigarettes    Start date: 12/15/1955    Quit date: 12/14/1965    Years since quitting: 58.0   Smokeless tobacco: Never  Vaping Use   Vaping status: Never Used  Substance and Sexual Activity   Alcohol use: Not Currently   Drug use: No   Sexual activity: Not Currently  Other Topics Concern   Not on file  Social History Narrative   Regular exercise - YES - golf   Lives with wife/he is her caretaker   Social Drivers of Corporate investment banker Strain: Low Risk  (11/18/2023)   Overall Financial Resource Strain (CARDIA)    Difficulty of Paying Living Expenses: Not hard at all  Food Insecurity: No Food Insecurity (11/18/2023)   Hunger Vital Sign    Worried About Running Out of Food in the Last Year: Never true    Ran Out of Food in the Last Year: Never true  Transportation Needs: No Transportation Needs (11/18/2023)   PRAPARE - Administrator, Civil Service (Medical): No    Lack of Transportation (Non-Medical): No  Physical Activity: Inactive  (11/18/2023)   Exercise Vital Sign    Days of Exercise per Week: 0 days    Minutes of Exercise per Session: 30 min  Stress: Stress Concern Present (11/18/2023)   [employer] of Occupational Health - Occupational Stress Questionnaire    Feeling of Stress : To some extent  Social Connections: Moderately Integrated (11/18/2023)   Social Connection and Isolation Panel [NHANES]    Frequency of Communication with Friends and Family: More than three times a week    Frequency of Social Gatherings with Friends and Family: Once a week    Attends Religious Services: Never    Database administrator or Organizations: Yes    Attends Banker  Meetings: More than 4 times per year    Marital Status: Married    Tobacco Counseling Counseling given: Not Answered   Clinical Intake:  Pre-visit preparation completed: Yes  Pain : No/denies pain     BMI - recorded: 24.87 Nutritional Status: BMI of 19-24  Normal Nutritional Risks: None Diabetes: No  How often do you need to have someone help you when you read instructions, pamphlets, or other written materials from your doctor or pharmacy?: 1 - Never  Interpreter Needed?: No  Information entered by :: Makhya Arave, RMA   Activities of Daily Living    12/27/2023    3:07 PM  In your present state of health, do you have any difficulty performing the following activities:  Hearing? 0  Vision? 0  Difficulty concentrating or making decisions? 0  Walking or climbing stairs? 0  Dressing or bathing? 0  Doing errands, shopping? 0  Preparing Food and eating ? N  Using the Toilet? N  In the past six months, have you accidently leaked urine? N  Do you have problems with loss of bowel control? N  Managing your Medications? N  Managing your Finances? N  Housekeeping or managing your Housekeeping? N    Patient Care Team: Plotnikov, Georgina Quint, MD as PCP - General Diona Browner Illene Bolus, MD as PCP - Cardiology (Cardiology) Jonelle Sidle, MD as Consulting Physician (Cardiology) Cherlyn Cushing, RN as Oncology Nurse Navigator Terrial Rhodes, MD as Consulting Physician (Nephrology) Ronne Binning Mardene Celeste, MD as Consulting Physician (Urology)  Indicate any recent Medical Services you may have received from other than [facility] providers in the past year (date may be approximate).     Assessment:   This is a routine wellness examination for Elih.  Hearing/Vision screen Hearing Screening - Comments:: Denies hearing difficulties   Vision Screening - Comments:: implants/corrective lenes   Goals Addressed             This Visit's Progress    My goal for 2024 is to get back to being physically active.   Not on track    PSA was ok      Depression Screen    11/18/2023    1:53 PM 07/14/2023    1:11 PM 03/17/2023    1:25 PM 01/12/2023    1:10 PM 12/16/2022    1:25 PM 09/29/2022    2:21 PM 12/12/2021    1:20 PM  PHQ 2/9 Scores  PHQ - 2 Score 0 0 0 0 0 0 0  PHQ- 9 Score   0        Fall Risk    12/27/2023    3:10 PM 11/18/2023    1:53 PM 07/14/2023    1:11 PM 03/17/2023    1:24 PM 12/24/2022    3:12 PM  Fall Risk   Falls in the past year? 0 0 0 0 0  Number falls in past yr: 0 0 0 0 0  Injury with Fall? 0 0 0 0 0  Risk for fall due to : No Fall Risks No Fall Risks No Fall Risks  No Fall Risks  Follow up Falls prevention discussed;Falls evaluation completed Falls evaluation completed Falls evaluation completed Falls evaluation completed Falls prevention discussed    MEDICARE RISK AT HOME: Medicare Risk at Home Any stairs in or around the home?: Yes If so, are there any without handrails?: Yes Adequate lighting in your home to reduce risk of falls?: Yes Life alert?: No Use of a cane,  walker or w/c?: No Grab bars in the bathroom?: Yes Shower chair or bench in shower?: Yes Elevated toilet seat or a handicapped toilet?: Yes  TIMED UP AND GO:  Was the test performed?  No    Cognitive Function:         12/24/2022    3:21 PM  6CIT Screen  What Year? 0 points  What month? 0 points  What time? 0 points  Count back from 20 0 points  Months in reverse 0 points  Repeat phrase 0 points  Total Score 0 points    Immunizations Immunization History  Administered Date(s) Administered   Fluad Quad(high Dose 65+) 07/25/2019, 08/26/2020, 08/27/2021, 08/27/2022   Influenza Whole 08/20/2009, 08/21/2010   Influenza, High Dose Seasonal PF 09/28/2016, 09/24/2017, 08/10/2018   Influenza, Seasonal, Injecte, Preservative Fre 11/11/2012   Influenza,inj,Quad PF,6+ Mos 11/20/2014   Influenza-Unspecified 11/07/2013, 11/08/2015, 07/31/2023   Moderna Covid-19 Vaccine Bivalent Booster 57yrs & up 03/07/2021   PFIZER(Purple Top)SARS-COV-2 Vaccination 12/13/2019, 12/31/2019, 08/22/2020   Pfizer Covid-19 Vaccine Bivalent Booster 46yrs & up 07/31/2023, 07/31/2023   Pneumococcal Conjugate-13 11/14/2013   Pneumococcal Polysaccharide-23 09/04/2010   Rsv, Bivalent, Protein Subunit Rsvpref,pf Verdis Frederickson) 07/31/2023   Td 08/21/2010   Tdap 02/17/2021   Zoster Recombinant(Shingrix) 09/09/2021   Zoster, Live 11/20/2014    TDAP status: Up to date  Flu Vaccine status: Up to date  Pneumococcal vaccine status: Up to date  Covid-19 vaccine status: Completed vaccines  Qualifies for Shingles Vaccine? Yes   Zostavax completed Yes   Shingrix Completed?: Yes  Screening Tests Health Maintenance  Topic Date Due   Colonoscopy  04/14/2018   COVID-19 Vaccine (6 - 2024-25 season) 09/25/2023   Medicare Annual Wellness (AWV)  12/26/2024   DTaP/Tdap/Td (3 - Td or Tdap) 02/18/2031   Pneumonia Vaccine 21+ Years old  Completed   INFLUENZA VACCINE  Completed   HPV VACCINES  Aged Out   Zoster Vaccines- Shingrix  Discontinued    Health Maintenance  Health Maintenance Due  Topic Date Due   Colonoscopy  04/14/2018   COVID-19 Vaccine (6 - 2024-25 season) 09/25/2023    Colorectal cancer screening: No longer required.    Lung Cancer Screening: (Low Dose CT Chest recommended if Age 38-80 years, 20 pack-year currently smoking OR have quit w/in 15years.) does not qualify.   Lung Cancer Screening Referral: N/A  Additional Screening:  Hepatitis C Screening: does not qualify;   Vision Screening: Recommended annual ophthalmology exams for early detection of glaucoma and other disorders of the eye. Is the patient up to date with their annual eye exam?  No  Who is the provider or what is the name of the office in which the patient attends annual eye exams? [facility] If pt is not established with a provider, would they like to be referred to a provider to establish care? No .   Dental Screening: Recommended annual dental exams for proper oral hygiene   Community Resource Referral / Chronic Care Management: CRR required this visit?  No   CCM required this visit?  No     Plan:     I have personally reviewed and noted the following in the patient's chart:   Medical and social history Use of alcohol, tobacco or illicit drugs  Current medications and supplements including opioid prescriptions. Patient is not currently taking opioid prescriptions. Functional ability and status Nutritional status Physical activity Advanced directives List of other physicians Hospitalizations, surgeries, and ER visits in previous 12 months Vitals Screenings  to include cognitive, depression, and falls Referrals and appointments  In addition, I have reviewed and discussed with patient certain preventive protocols, quality metrics, and best practice recommendations. A written personalized care plan for preventive services as well as general preventive health recommendations were provided to patient.     Zaheer Wageman L Ali Mohl, CMA   12/27/2023   After Visit Summary: (MyChart) Due to this being a telephonic visit, the after visit summary with patients personalized plan was offered to patient via MyChart   Nurse  Notes: Patient is up to date with all health maintenance.  Patient wanted to know if he needs to get a 2nd Shingrix shot, as he has had a Zoster and 1 Shingrix shot.  He would like to discuss with Dr. Posey Rea during his up coming visit.     Medical screening examination/treatment/procedure(s) were performed by non-physician practitioner and as supervising physician I was immediately available for consultation/collaboration.  I agree with above. Jacinta Shoe, MD

## 2023-12-27 NOTE — Patient Instructions (Addendum)
Mr. Zweber , Thank you for taking time to come for your Medicare Wellness Visit. I appreciate your ongoing commitment to your health goals. Please review the following plan we discussed and let me know if I can assist you in the future.   Referrals/Orders/Follow-Ups/Clinician Recommendations: It was nice talking you today.   Keep up the good work.  This is a list of the screening recommended for you and due dates:  Health Maintenance  Topic Date Due   Colon Cancer Screening  04/14/2018   COVID-19 Vaccine (6 - 2024-25 season) 09/25/2023   Medicare Annual Wellness Visit  12/26/2024   DTaP/Tdap/Td vaccine (3 - Td or Tdap) 02/18/2031   Pneumonia Vaccine  Completed   Flu Shot  Completed   HPV Vaccine  Aged Out   Zoster (Shingles) Vaccine  Discontinued    Advanced directives: (Copy Requested) Please bring a copy of your health care power of attorney and living will to the office to be added to your chart at your convenience.  Next Medicare Annual Wellness Visit scheduled for next year: Yes

## 2024-01-03 DIAGNOSIS — N184 Chronic kidney disease, stage 4 (severe): Secondary | ICD-10-CM | POA: Diagnosis not present

## 2024-01-10 DIAGNOSIS — N2581 Secondary hyperparathyroidism of renal origin: Secondary | ICD-10-CM | POA: Diagnosis not present

## 2024-01-10 DIAGNOSIS — I129 Hypertensive chronic kidney disease with stage 1 through stage 4 chronic kidney disease, or unspecified chronic kidney disease: Secondary | ICD-10-CM | POA: Diagnosis not present

## 2024-01-10 DIAGNOSIS — N184 Chronic kidney disease, stage 4 (severe): Secondary | ICD-10-CM | POA: Diagnosis not present

## 2024-01-10 DIAGNOSIS — D631 Anemia in chronic kidney disease: Secondary | ICD-10-CM | POA: Diagnosis not present

## 2024-01-10 DIAGNOSIS — N189 Chronic kidney disease, unspecified: Secondary | ICD-10-CM | POA: Diagnosis not present

## 2024-01-10 DIAGNOSIS — R809 Proteinuria, unspecified: Secondary | ICD-10-CM | POA: Diagnosis not present

## 2024-01-24 ENCOUNTER — Other Ambulatory Visit: Payer: Medicare HMO

## 2024-01-24 DIAGNOSIS — C61 Malignant neoplasm of prostate: Secondary | ICD-10-CM | POA: Diagnosis not present

## 2024-01-25 LAB — PSA: Prostate Specific Ag, Serum: 0.1 ng/mL (ref 0.0–4.0)

## 2024-01-31 ENCOUNTER — Ambulatory Visit: Payer: Medicare HMO | Admitting: Urology

## 2024-01-31 VITALS — BP 96/58 | HR 78

## 2024-01-31 DIAGNOSIS — C61 Malignant neoplasm of prostate: Secondary | ICD-10-CM

## 2024-01-31 DIAGNOSIS — N401 Enlarged prostate with lower urinary tract symptoms: Secondary | ICD-10-CM

## 2024-01-31 DIAGNOSIS — R351 Nocturia: Secondary | ICD-10-CM

## 2024-01-31 IMAGING — US US CAROTID DUPLEX BILAT
1 series · 13 of 24 positions shown · non-contrast
Comparison: None Available.

CLINICAL DATA: 80-year-old male with a history of bilateral carotid
bruit

EXAM:
BILATERAL CAROTID DUPLEX ULTRASOUND
TECHNIQUE: Gray scale imaging, color Doppler and duplex ultrasound were
performed of bilateral carotid and vertebral arteries in the neck.

[Series 1: us carotid bilateral · 13 of 69 slices shown]
[im 1/69]
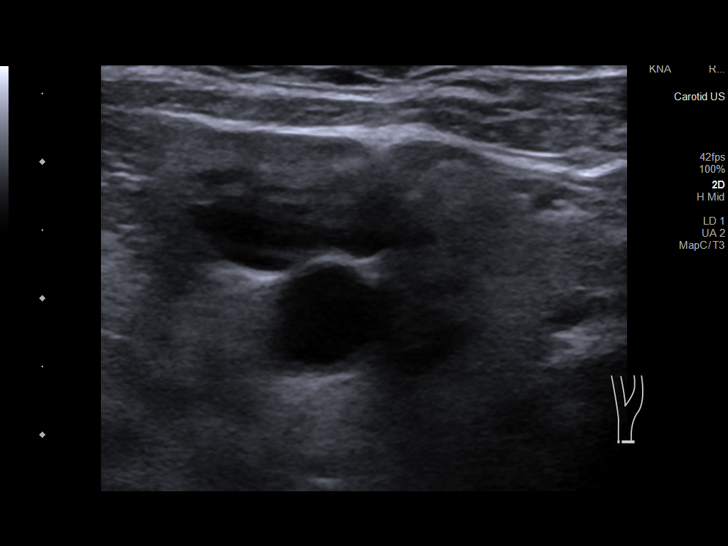
[im 6/69]
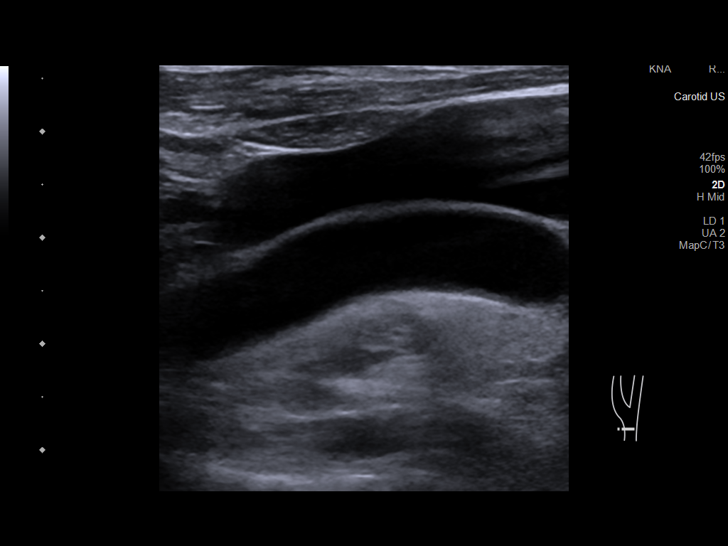
[im 12/69]
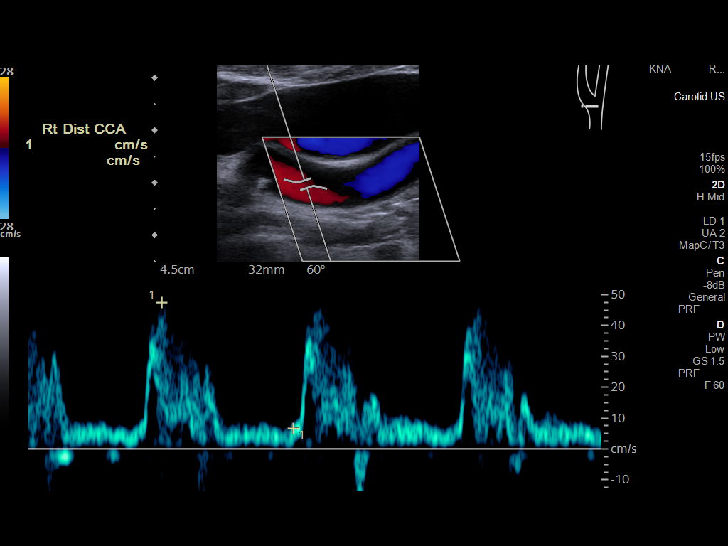
[im 18/69]
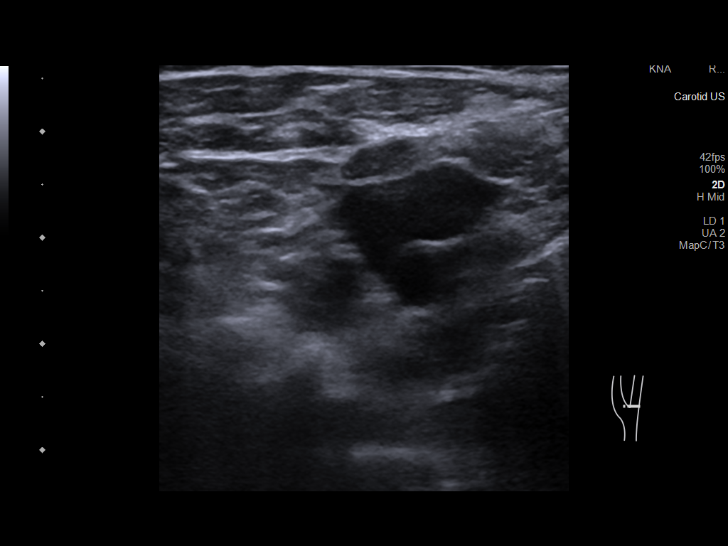
[im 24/69]
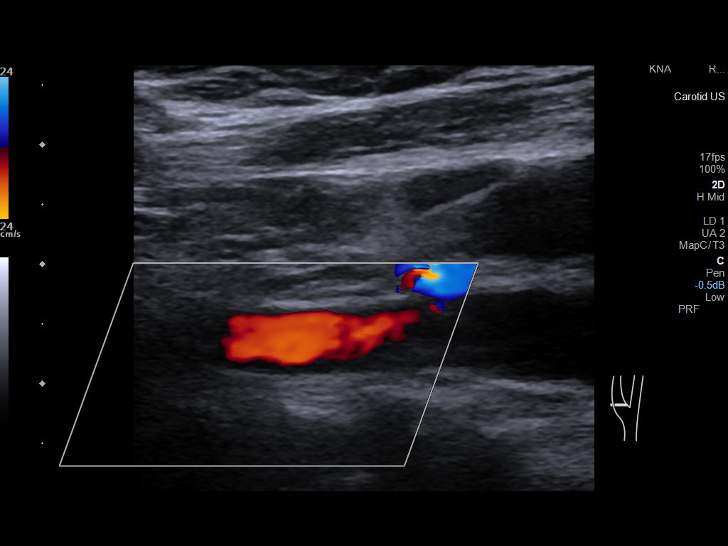
[im 30/69]
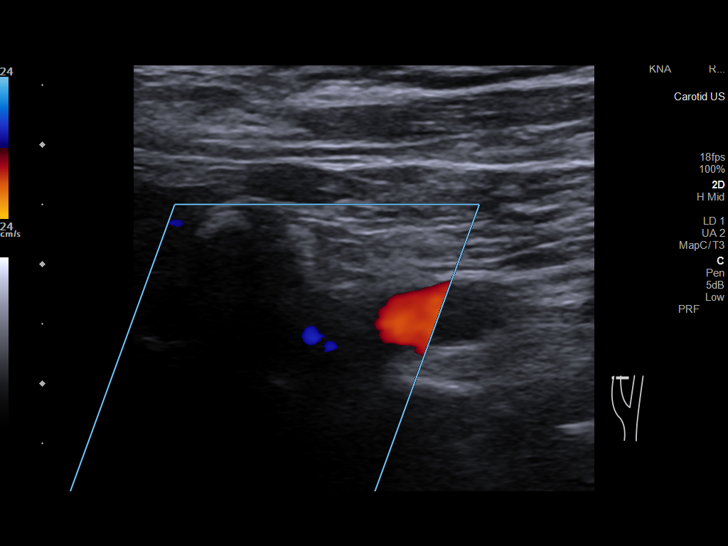
[im 36/69]
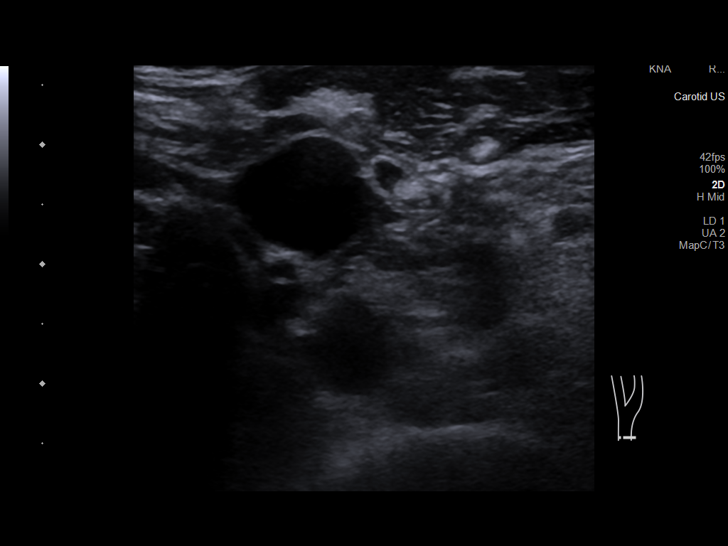
[im 39/69]
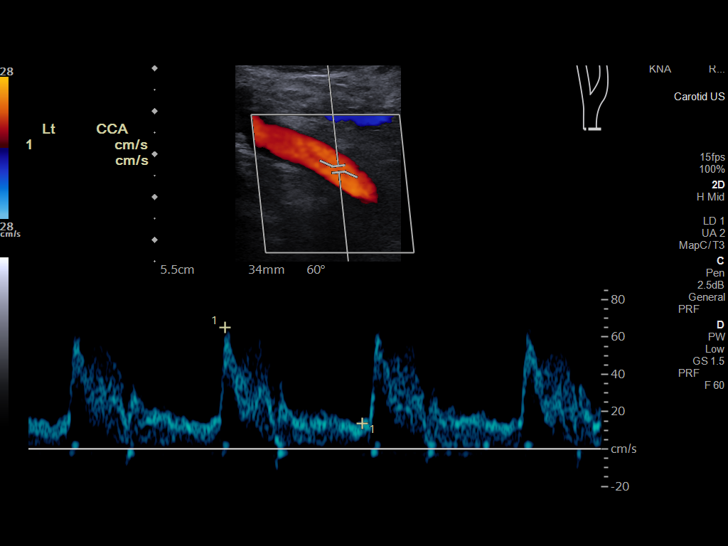
[im 45/69]
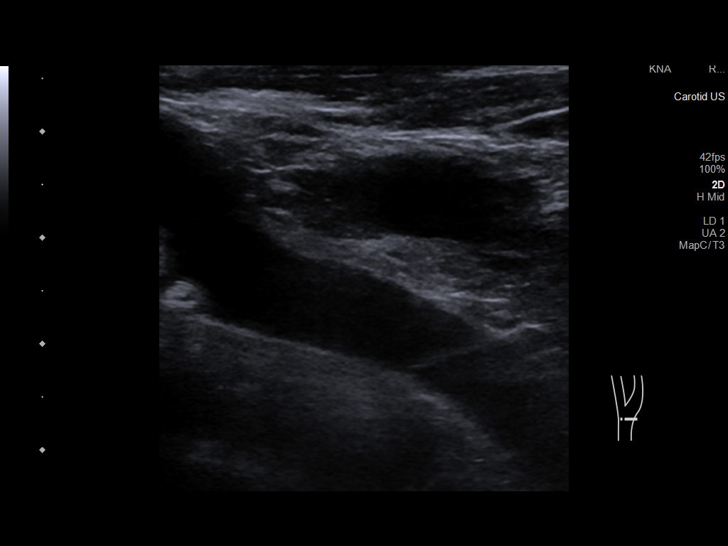
[im 51/69]
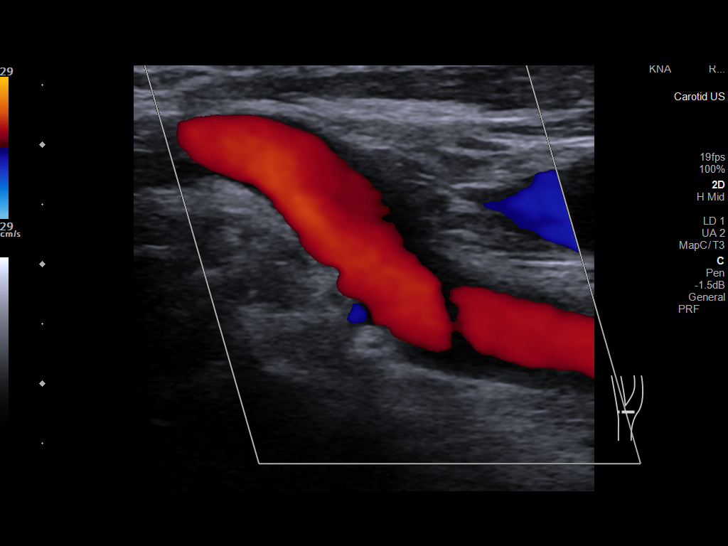
[im 57/69]
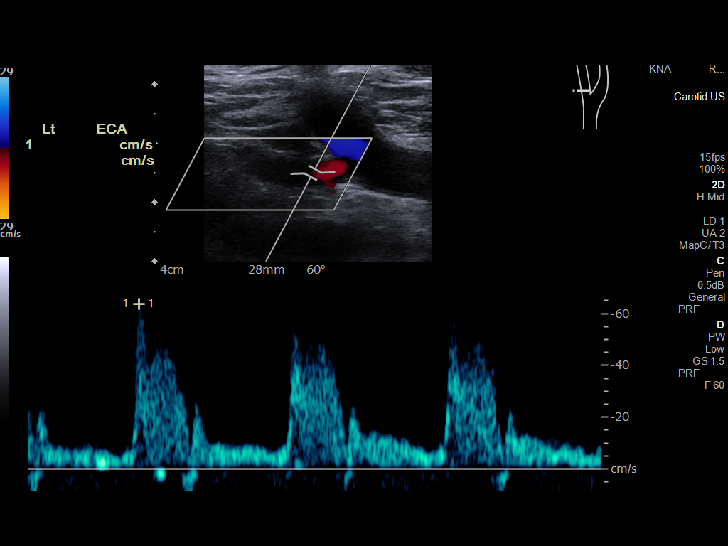
[im 63/69]
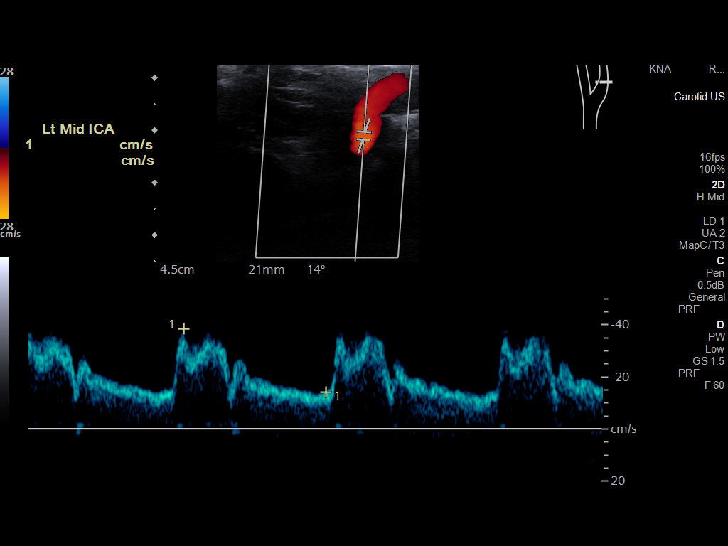
[im 69/69]
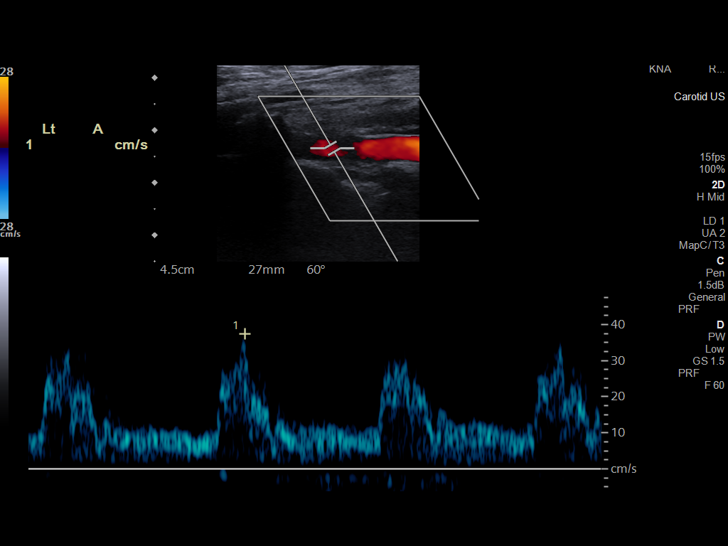

[13 of 24 positions shown; findings below may reference images not displayed]

FINDINGS: Criteria: Quantification of carotid stenosis is based on velocity
parameters that correlate the residual internal carotid diameter
with NASCET-based stenosis levels, using the diameter of the distal
internal carotid lumen as the denominator for stenosis measurement.

The following velocity measurements were obtained:

RIGHT

ICA:  Systolic 56 cm/sec, Diastolic 17 cm/sec

CCA:  54 cm/sec

SYSTOLIC ICA/CCA RATIO:

ECA:  61 cm/sec

LEFT

ICA:  Systolic 44 cm/sec, Diastolic 16 cm/sec

CCA:  69 cm/sec

SYSTOLIC ICA/CCA RATIO:

ECA:  64 cm/sec

Right Brachial SBP: Not acquired

Left Brachial SBP: Not acquired

RIGHT CAROTID ARTERY: No significant calcifications of the right
common carotid artery. Intermediate waveform maintained.
Heterogeneous and partially calcified plaque at the right carotid
bifurcation. No significant lumen shadowing. Low resistance waveform
of the right ICA. Tortuosity

RIGHT VERTEBRAL ARTERY: Antegrade flow with low resistance waveform.

LEFT CAROTID ARTERY: No significant calcifications of the left
common carotid artery. Intermediate waveform maintained.
Heterogeneous and partially calcified plaque at the left carotid
bifurcation without significant lumen shadowing. Low resistance
waveform of the left ICA. Tortuosity

LEFT VERTEBRAL ARTERY:  Antegrade flow with low resistance waveform.
IMPRESSION: Color duplex indicates minimal heterogeneous and calcified plaque,
with no hemodynamically significant stenosis by duplex criteria in
the extracranial cerebrovascular circulation.

## 2024-01-31 IMAGING — US US RENAL
1 series · 14 of 25 positions shown · non-contrast
Comparison: Renal ultrasound 05/10/2020

CLINICAL DATA: Chronic renal disease

EXAM:
RENAL / URINARY TRACT ULTRASOUND COMPLETE

[Series 1: us renal · 14 of 89 slices shown]
[im 1/89]
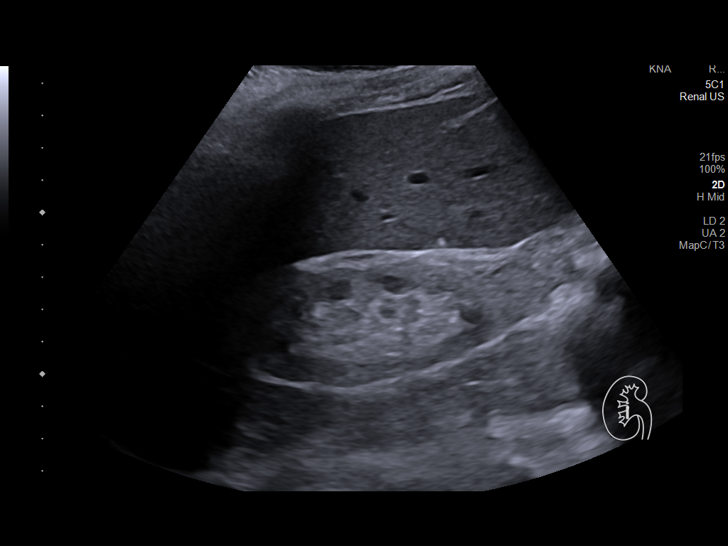
[im 8/89]
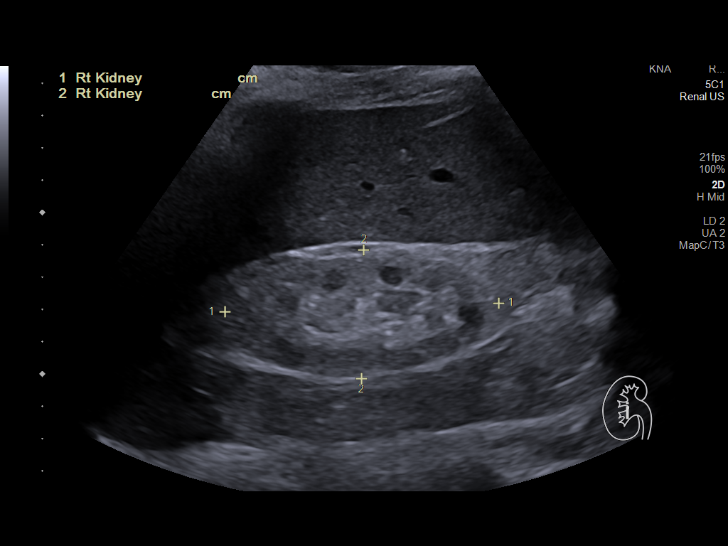
[im 15/89]
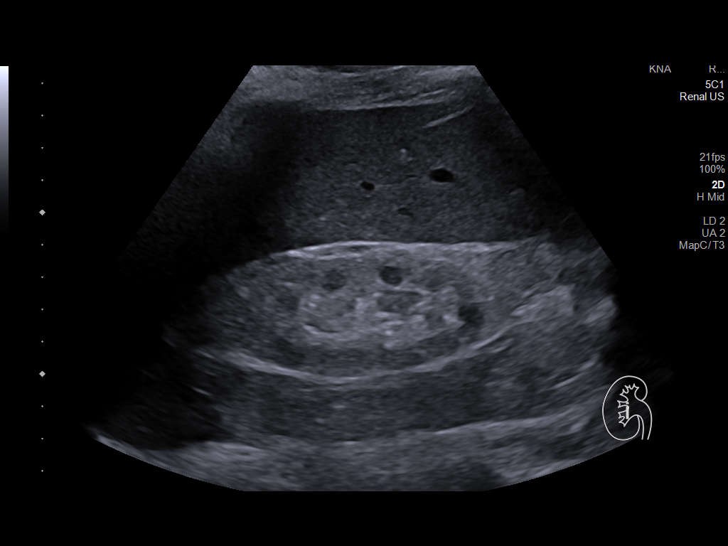
[im 23/89]
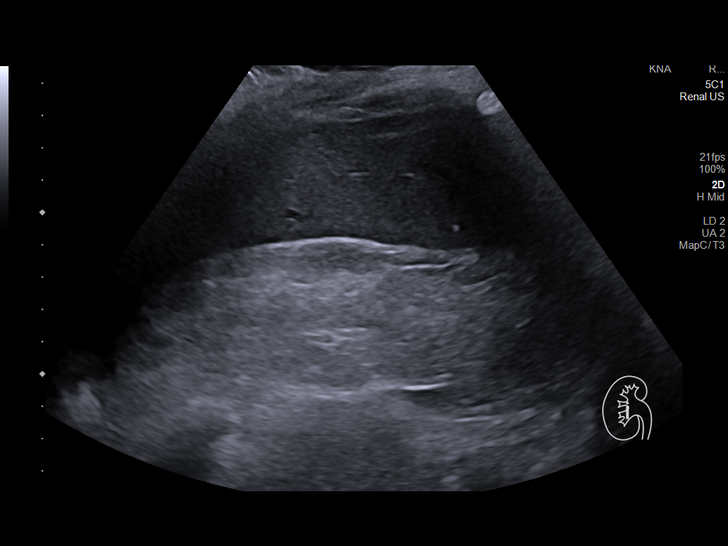
[im 30/89]
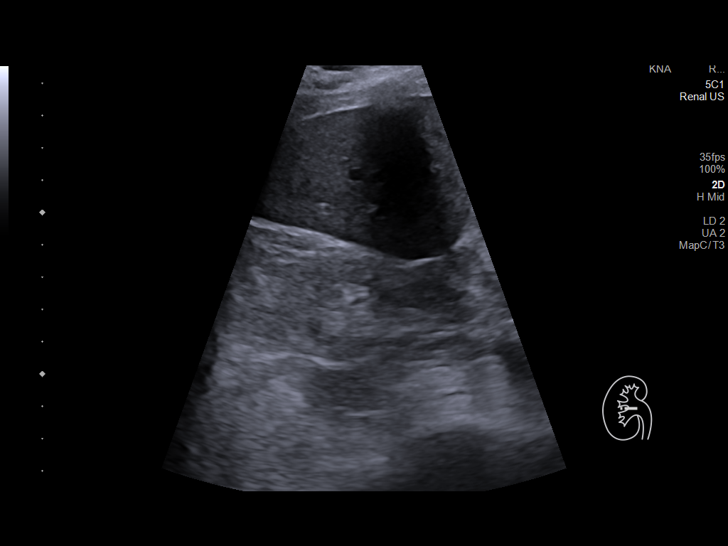
[im 34/89]
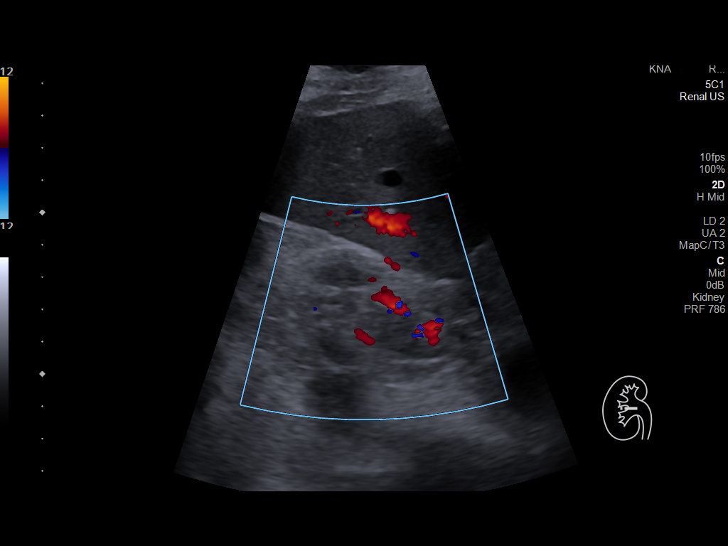
[im 41/89]
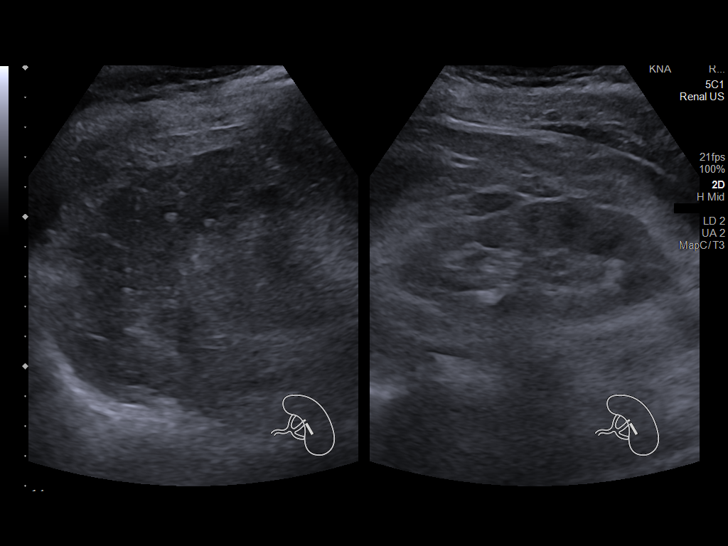
[im 48/89]
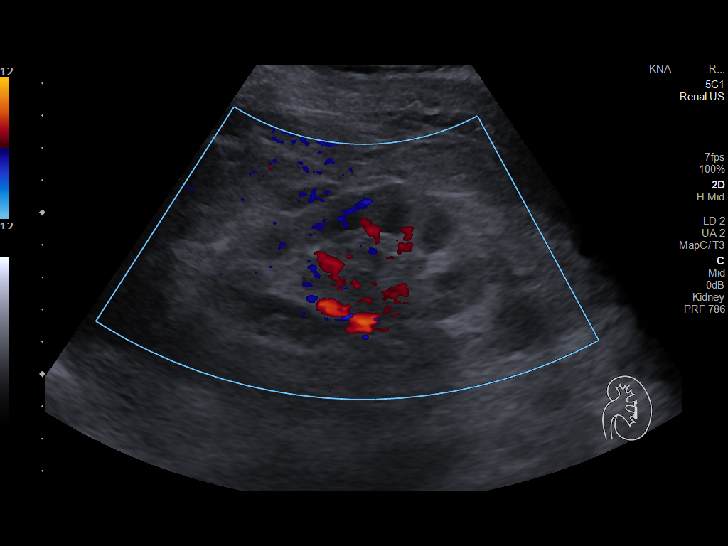
[im 56/89]
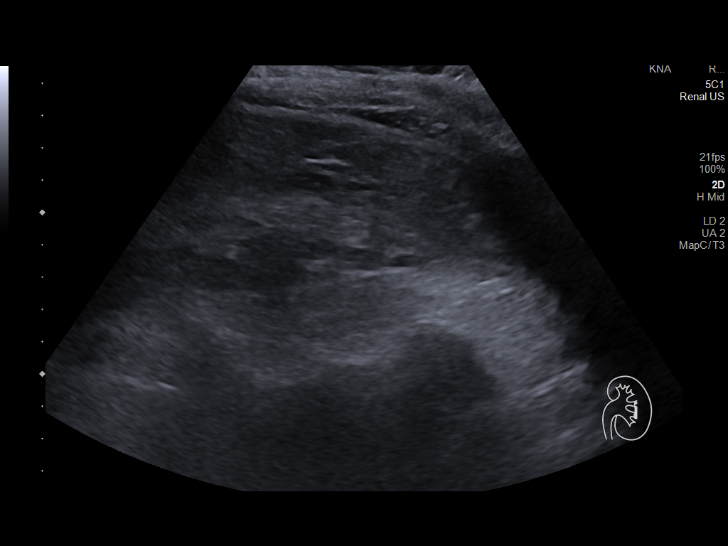
[im 59/89]
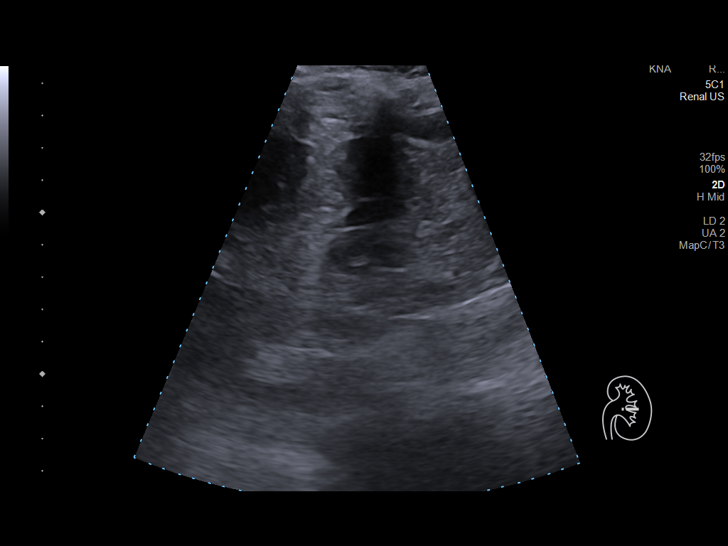
[im 67/89]
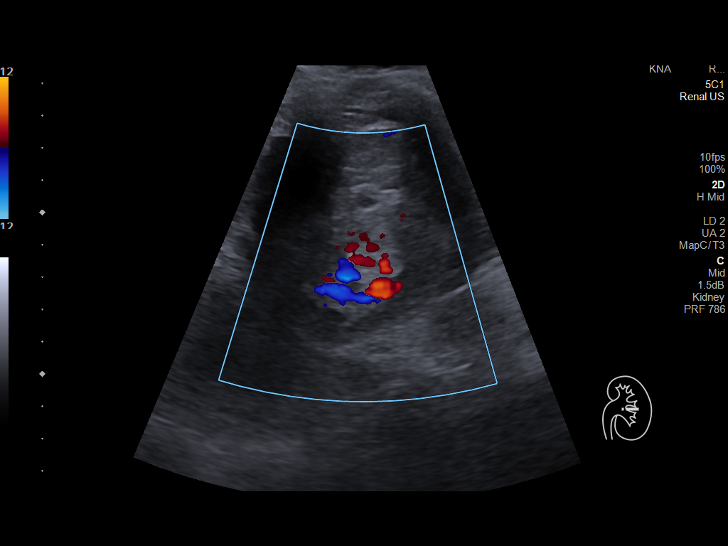
[im 74/89]
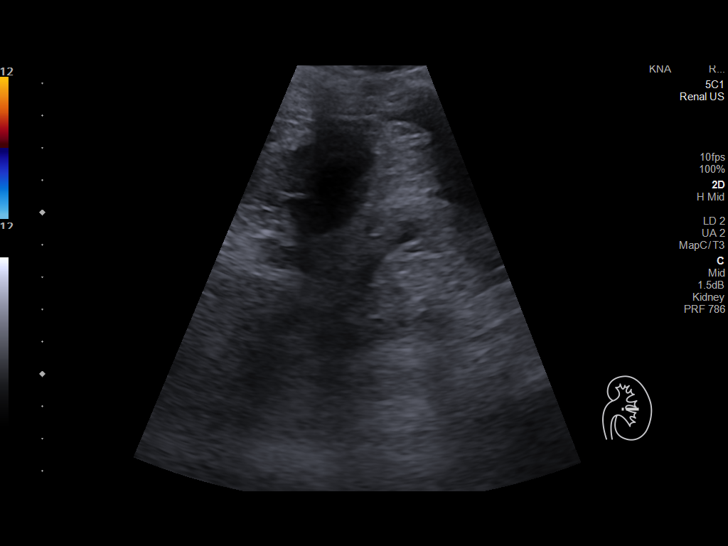
[im 81/89]
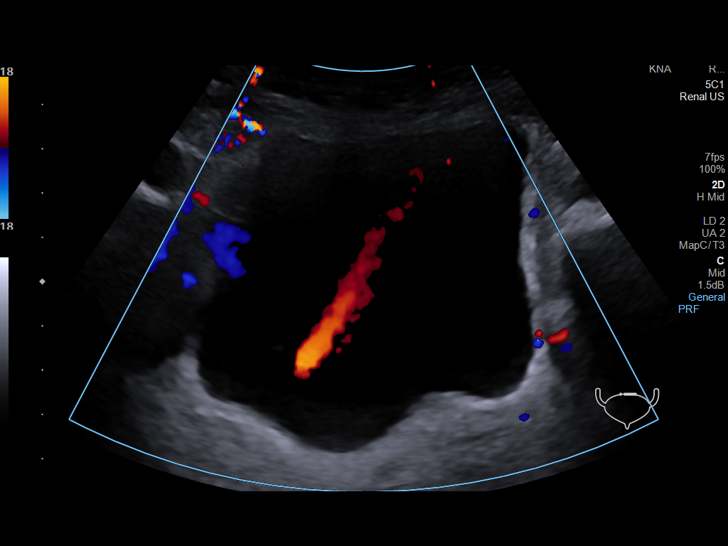
[im 89/89]
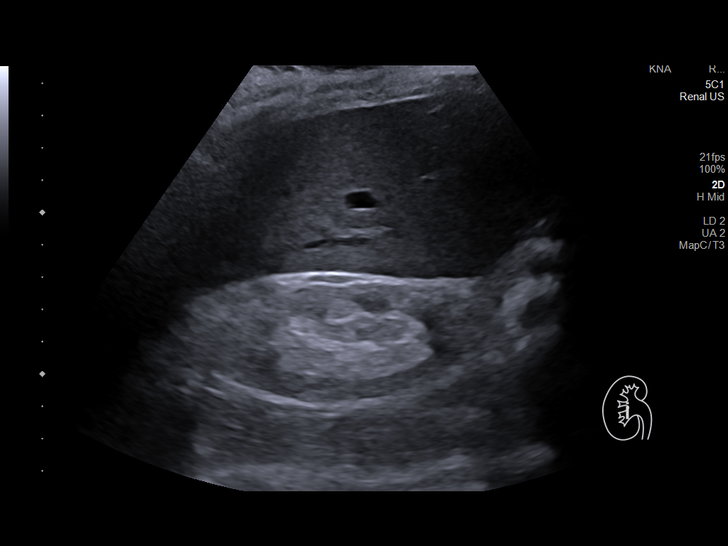

[14 of 25 positions shown; findings below may reference images not displayed]

FINDINGS: Right Kidney:

Renal measurements: 8.5 x 4.0 x 3.8 cm = volume: 67.2 mL. Renal
cortical thinning. Increased cortical echogenicity. No
hydronephrosis.

Left Kidney:

Renal measurements: 9.5 x 4.6 x 4.2 cm = volume: 94.2 mL. Renal
cortical thinning. Increased renal cortical echogenicity. No
hydronephrosis. Probable 3 mm stone inferior pole.

Bladder:

Appears normal for degree of bladder distention.

Other:

None.
IMPRESSION: No hydronephrosis.

Increased cortical echogenicity compatible with chronic medical
renal disease.

## 2024-01-31 MED ORDER — TAMSULOSIN HCL 0.4 MG PO CAPS: 0.4000 mg | ORAL_CAPSULE | Freq: Two times a day (BID) | ORAL | Status: DC

## 2024-01-31 NOTE — Progress Notes (Unsigned)
 01/31/2024 1:21 PM   GIAN YBARRA December 15, 1940 [id number]  Referring provider: Tresa Garter, MD [address] [place],  [place] [address]  Followup BPH and Prostate cancer   HPI: Mr Fandino is a 82yo here for followup prostate cancer and BPH. PSA decreased to 0.1 from 0.2. IPSS 15 QOL 3 on flomax BID. Urine stream strong. No straining to urinate. Nocturia 3-5x. He has bothersome urinary urgency.  He drinks 64oz of water daily.    PMH: Past Medical History:  Diagnosis Date   Bilateral carotid bruits    US carotid bilateral 05-14-2022 epic   Chronic kidney disease stage 4    Colon polyps    Coronary artery calcification seen on CT scan    Hyperlipidemia    Hypertension    per pt on 08-25-2022 off amlodipine last 3 to 4 months per nephrology dr Cherie Ouch   Osteoarthritis    Prostate cancer [facility])    RBBB     Surgical History: Past Surgical History:  Procedure Laterality Date   COLONOSCOPY     CYSTOSCOPY N/A 09/03/2022   Procedure: CYSTOSCOPY;  Surgeon: Malen Gauze, MD;  Location: [facility];  Service: Urology;  Laterality: N/A;   INGUINAL HERNIA REPAIR  2005   Right   LESION REMOVAL Left 09/23/2020   Procedure: MINOR EXICISION OF LESION OF LEFT EAR;  Surgeon: Serena Colonel, MD;  Location: [facility];  Service: ENT;  Laterality: Left;   PARTIAL COLECTOMY Right 12/16/2015   Procedure: PARTIAL CECECTOMY AND APPENDECTOMY;  Surgeon: Jimmye Norman, MD;  Location: [facility] OR;  Service: General;  Laterality: Right;   POLYPECTOMY     RADIOACTIVE SEED IMPLANT N/A 09/03/2022   Procedure: RADIOACTIVE SEED IMPLANT/BRACHYTHERAPY IMPLANT;  Surgeon: Malen Gauze, MD;  Location: [facility];  Service: Urology;  Laterality: N/A;   SPACE OAR INSTILLATION N/A 09/03/2022   Procedure: SPACE OAR INSTILLATION;  Surgeon: Malen Gauze, MD;  Location: [facility];  Service: Urology;  Laterality: N/A;   VASECTOMY       Home Medications:  Allergies as of 01/31/2024       Reactions   Bupropion Hcl    REACTION: extreme dizziness   Lovastatin    myalgias   Uroxatral [alfuzosin]    Weakness, tinnitus        Medication List        Accurate as of January 31, 2024  1:21 PM. If you have any questions, ask your nurse or doctor.          cyanocobalamin 1000 MCG tablet Commonly known as: VITAMIN B12   Fish Oil 1000 MG Caps 1,200 mg daily at 2 PM.   Glucosamine 1500 Complex Caps Take by mouth daily at 2 PM.   pravastatin 20 MG tablet Commonly known as: PRAVACHOL TAKE 1 TABLET EVERY DAY   tamsulosin 0.4 MG Caps capsule Commonly known as: FLOMAX Take 1 capsule (0.4 mg total) by mouth 2 (two) times daily.   triamcinolone cream 0.1 % Commonly known as: KENALOG SMARTSIG:1 Application Topical 2-3 Times Daily   Vitamin D 50 MCG (2000 UT) Caps Take 1 capsule by mouth daily.        Allergies:  Allergies  Allergen Reactions   Bupropion Hcl     REACTION: extreme dizziness   Lovastatin     myalgias   Uroxatral [Alfuzosin]     Weakness, tinnitus    Family History: Family History  Problem Relation Age of  Onset   Diverticulitis Mother    Hypertension Father    Cancer Father        brain ca   Kidney disease Brother 56   Colon cancer Brother    Rectal cancer Neg Hx    Stomach cancer Neg Hx    Esophageal cancer Neg Hx     Social History:  reports that he quit smoking about 58 years ago. His smoking use included cigarettes. He started smoking about 68 years ago. He has a 5 pack-year smoking history. He has never used smokeless tobacco. He reports that he does not currently use alcohol. He reports that he does not use drugs.  ROS: All other review of systems were reviewed and are negative except what is noted above in HPI  Physical Exam: BP (!) 96/58   Pulse 78   Constitutional:  Alert and oriented, No acute distress. HEENT: [place] AT, moist mucus membranes.  Trachea midline, no  masses. Cardiovascular: No clubbing, cyanosis, or edema. Respiratory: Normal respiratory effort, no increased work of breathing. GI: Abdomen is soft, nontender, nondistended, no abdominal masses GU: No CVA tenderness.  Lymph: No cervical or inguinal lymphadenopathy. Skin: No rashes, bruises or suspicious lesions. Neurologic: Grossly intact, no focal deficits, moving all 4 extremities. Psychiatric: Normal mood and affect.  Laboratory Data: Lab Results  Component Value Date   WBC 8.2 11/02/2022   HGB 11.1 (A) 11/02/2022   HCT 33 (A) 11/02/2022   MCV 90 08/28/2022   PLT 242 11/02/2022    Lab Results  Component Value Date   CREATININE 3.09 (H) 12/16/2022    Lab Results  Component Value Date   PSA 11.03 (H) 10/23/2021   PSA 6.35 (H) 09/29/2017   PSA 6.51 (H) 05/24/2017    No results found for: "TESTOSTERONE"  Lab Results  Component Value Date   HGBA1C 5.8 (H) 12/09/2015    Urinalysis    Component Value Date/Time   COLORURINE YELLOW 09/14/2022 1410   APPEARANCEUR Clear 07/21/2023 1428   LABSPEC 1.010 09/14/2022 1410   PHURINE 5.5 09/14/2022 1410   GLUCOSEU Negative 07/21/2023 1428   GLUCOSEU NEGATIVE 09/14/2022 1410   HGBUR MODERATE (A) 09/14/2022 1410   BILIRUBINUR Negative 07/21/2023 1428   KETONESUR NEGATIVE 09/14/2022 1410   PROTEINUR Trace 07/21/2023 1428   UROBILINOGEN 0.2 09/14/2022 1410   NITRITE Negative 07/21/2023 1428   NITRITE POSITIVE (A) 09/14/2022 1410   LEUKOCYTESUR Negative 07/21/2023 1428   LEUKOCYTESUR LARGE (A) 09/14/2022 1410    Lab Results  Component Value Date   LABMICR See below: 07/21/2023   WBCUA None seen 07/21/2023   LABEPIT 0-10 07/21/2023   MUCUS Present 06/12/2022   BACTERIA None seen 07/21/2023    Pertinent Imaging:  No results found for this or any previous visit.  No results found for this or any previous visit.  No results found for this or any previous visit.  No results found for this or any previous  visit.  Results for orders placed during the hospital encounter of 05/14/22  US RENAL  Narrative CLINICAL DATA:  Chronic renal disease  EXAM: RENAL / URINARY TRACT ULTRASOUND COMPLETE  COMPARISON:  Renal ultrasound 05/10/2020  FINDINGS: Right Kidney:  Renal measurements: 8.5 x 4.0 x 3.8 cm = volume: 67.2 mL. Renal cortical thinning. Increased cortical echogenicity. No hydronephrosis.  Left Kidney:  Renal measurements: 9.5 x 4.6 x 4.2 cm = volume: 94.2 mL. Renal cortical thinning. Increased renal cortical echogenicity. No hydronephrosis. Probable 3 mm stone inferior pole.  Bladder:  Appears normal for degree of bladder distention.  Other:  None.  IMPRESSION: No hydronephrosis.  Increased cortical echogenicity compatible with chronic medical renal disease.   Electronically Signed By: Annia Belt M.D. On: 05/14/2022 15:05  No results found for this or any previous visit.  No results found for this or any previous visit.  No results found for this or any previous visit.   Assessment & Plan:    1. Prostate cancer (HCC) (Primary) Followup 6 months with PSA - Urinalysis, Routine w reflex microscopic  2. Benign prostatic hyperplasia with nocturia COntinue flomax 0.4mg  BID  3. Nocturia Continue flomax 0.4mg  BID   No follow-ups on file.  Wilkie Aye, MD  [facility] [place]

## 2024-02-01 ENCOUNTER — Encounter: Payer: Self-pay | Admitting: Urology

## 2024-02-01 LAB — URINALYSIS, ROUTINE W REFLEX MICROSCOPIC
Bilirubin, UA: NEGATIVE
Glucose, UA: NEGATIVE
Ketones, UA: NEGATIVE
Leukocytes,UA: NEGATIVE
Nitrite, UA: NEGATIVE
RBC, UA: NEGATIVE
Specific Gravity, UA: 1.02 (ref 1.005–1.030)
Urobilinogen, Ur: 0.2 mg/dL (ref 0.2–1.0)
pH, UA: 6 (ref 5.0–7.5)

## 2024-02-01 NOTE — Patient Instructions (Signed)

## 2024-02-10 ENCOUNTER — Other Ambulatory Visit: Payer: Self-pay | Admitting: Urology

## 2024-02-16 ENCOUNTER — Telehealth: Payer: Self-pay | Admitting: Internal Medicine

## 2024-02-16 ENCOUNTER — Ambulatory Visit (INDEPENDENT_AMBULATORY_CARE_PROVIDER_SITE_OTHER): Payer: Medicare HMO | Admitting: Internal Medicine

## 2024-02-16 ENCOUNTER — Ambulatory Visit: Payer: Self-pay

## 2024-02-16 ENCOUNTER — Encounter: Payer: Self-pay | Admitting: Internal Medicine

## 2024-02-16 VITALS — BP 106/58 | HR 63 | Temp 97.7°F | Ht 62.0 in | Wt 137.8 lb

## 2024-02-16 DIAGNOSIS — Z0000 Encounter for general adult medical examination without abnormal findings: Secondary | ICD-10-CM | POA: Diagnosis not present

## 2024-02-16 DIAGNOSIS — I2510 Atherosclerotic heart disease of native coronary artery without angina pectoris: Secondary | ICD-10-CM

## 2024-02-16 DIAGNOSIS — C61 Malignant neoplasm of prostate: Secondary | ICD-10-CM

## 2024-02-16 DIAGNOSIS — I10 Essential (primary) hypertension: Secondary | ICD-10-CM | POA: Diagnosis not present

## 2024-02-16 DIAGNOSIS — I2583 Coronary atherosclerosis due to lipid rich plaque: Secondary | ICD-10-CM

## 2024-02-16 DIAGNOSIS — E785 Hyperlipidemia, unspecified: Secondary | ICD-10-CM

## 2024-02-16 DIAGNOSIS — N184 Chronic kidney disease, stage 4 (severe): Secondary | ICD-10-CM

## 2024-02-16 DIAGNOSIS — N320 Bladder-neck obstruction: Secondary | ICD-10-CM | POA: Insufficient documentation

## 2024-02-16 LAB — COMPREHENSIVE METABOLIC PANEL WITH GFR
ALT: 14 U/L (ref 0–53)
AST: 19 U/L (ref 0–37)
Albumin: 4 g/dL (ref 3.5–5.2)
Alkaline Phosphatase: 73 U/L (ref 39–117)
BUN: 91 mg/dL (ref 6–23)
CO2: 27 meq/L (ref 19–32)
Calcium: 9 mg/dL (ref 8.4–10.5)
Chloride: 102 meq/L (ref 96–112)
Creatinine, Ser: 3.85 mg/dL — ABNORMAL HIGH (ref 0.40–1.50)
GFR: 13.89 mL/min — CL (ref >60.00)
Glucose, Bld: 138 mg/dL — ABNORMAL HIGH (ref 70–99)
Potassium: 4.5 meq/L (ref 3.5–5.1)
Sodium: 137 meq/L (ref 135–145)
Total Bilirubin: 0.3 mg/dL (ref 0.2–1.2)
Total Protein: 7.1 g/dL (ref 6.0–8.3)

## 2024-02-16 LAB — CBC WITH DIFFERENTIAL/PLATELET
Basophils Absolute: 0.1 10*3/uL (ref 0.0–0.1)
Basophils Relative: 0.9 % (ref 0.0–3.0)
Eosinophils Absolute: 0.2 10*3/uL (ref 0.0–0.7)
Eosinophils Relative: 1.9 % (ref 0.0–5.0)
HCT: 36.5 % — ABNORMAL LOW (ref 39.0–52.0)
Hemoglobin: 12 g/dL — ABNORMAL LOW (ref 13.0–17.0)
Lymphocytes Relative: 14.5 % (ref 12.0–46.0)
Lymphs Abs: 1.3 10*3/uL (ref 0.7–4.0)
MCHC: 32.9 g/dL (ref 30.0–36.0)
MCV: 92.2 fl (ref 78.0–100.0)
Monocytes Absolute: 0.9 10*3/uL (ref 0.1–1.0)
Monocytes Relative: 10.5 % (ref 3.0–12.0)
Neutro Abs: 6.4 10*3/uL (ref 1.4–7.7)
Neutrophils Relative %: 72.2 % (ref 43.0–77.0)
Platelets: 180 10*3/uL (ref 150.0–400.0)
RBC: 3.96 Mil/uL — ABNORMAL LOW (ref 4.22–5.81)
RDW: 13.4 % (ref 11.5–15.5)
WBC: 8.9 10*3/uL (ref 4.0–10.5)

## 2024-02-16 LAB — LIPID PANEL
Cholesterol: 141 mg/dL (ref 0–200)
HDL: 57.6 mg/dL (ref >39.00)
LDL Cholesterol: 71 mg/dL (ref 0–99)
NonHDL: 83.73
Total CHOL/HDL Ratio: 2
Triglycerides: 62 mg/dL (ref 0.0–149.0)
VLDL: 12.4 mg/dL (ref 0.0–40.0)

## 2024-02-16 MED ORDER — TADALAFIL 5 MG PO TABS: 5.0000 mg | ORAL_TABLET | Freq: Every day | ORAL | 5 refills | Status: DC

## 2024-02-16 NOTE — Assessment & Plan Note (Signed)
 F/u w/Dr Calodonato q 3 months On Flomax for BPH

## 2024-02-16 NOTE — Assessment & Plan Note (Signed)
On NAS

## 2024-02-16 NOTE — Assessment & Plan Note (Signed)
 Check PSA. ?

## 2024-02-16 NOTE — Telephone Encounter (Signed)
 FYI   Received a call from on call nurse - abnormal labs from today  - -BUN 91, Cr 13.    CKD 4 - steady decline  - this is not new, but slightly worse.  Nothing urgent needs to be done tonight.    Following with nephrology

## 2024-02-16 NOTE — Progress Notes (Signed)
 Subjective:  Patient ID: Sergio Hughes, male    DOB: [birth year]  Age: 83 y.o. MRN: [id number]  CC: Annual Exam (Annual Exam)   HPI Sergio Hughes presents for well exam C/o hands cramp  Outpatient Medications Prior to Visit  Medication Sig Dispense Refill   Cholecalciferol (VITAMIN D) 2000 UNITS CAPS Take 1 capsule by mouth daily.     cyanocobalamin (VITAMIN B12) 1000 MCG tablet      Glucosamine-Chondroit-Vit C-Mn (GLUCOSAMINE 1500 COMPLEX) CAPS Take by mouth daily at 2 PM.     Omega-3 Fatty Acids (FISH OIL) 1000 MG CAPS 1,200 mg daily at 2 PM.     pravastatin (PRAVACHOL) 20 MG tablet TAKE 1 TABLET EVERY DAY 90 tablet 3   tamsulosin (FLOMAX) 0.4 MG CAPS capsule Take 1 capsule (0.4 mg total) by mouth 2 (two) times daily.     triamcinolone cream (KENALOG) 0.1 % SMARTSIG:1 Application Topical 2-3 Times Daily     No facility-administered medications prior to visit.    ROS: Review of Systems  Constitutional:  Negative for appetite change, fatigue and unexpected weight change.  HENT:  Negative for congestion, nosebleeds, sneezing, sore throat and trouble swallowing.   Eyes:  Negative for itching and visual disturbance.  Respiratory:  Negative for cough.   Cardiovascular:  Negative for chest pain, palpitations and leg swelling.  Gastrointestinal:  Negative for abdominal distention, blood in stool, diarrhea and nausea.  Genitourinary:  Negative for frequency and hematuria.  Musculoskeletal:  Negative for back pain, gait problem, joint swelling and neck pain.  Skin:  Negative for rash.  Neurological:  Negative for dizziness, tremors, speech difficulty and weakness.  Psychiatric/Behavioral:  Negative for agitation, dysphoric mood and sleep disturbance. The patient is not nervous/anxious.     Objective:  BP (!) 106/58   Pulse 63   Temp 97.7 F (36.5 C)   Ht 5\' 2"  (1.575 m)   Wt 137 lb 12.8 oz (62.5 kg)   SpO2 98%   BMI 25.20 kg/m   BP Readings from Last 3 Encounters:  02/16/24  (!) 106/58  01/31/24 (!) 96/58  11/18/23 102/62    Wt Readings from Last 3 Encounters:  02/16/24 137 lb 12.8 oz (62.5 kg)  12/27/23 136 lb (61.7 kg)  11/18/23 136 lb (61.7 kg)    Physical Exam Constitutional:      General: He is not in acute distress.    Appearance: Normal appearance. He is well-developed.     Comments: NAD  Eyes:     Conjunctiva/sclera: Conjunctivae normal.     Pupils: Pupils are equal, round, and reactive to light.  Neck:     Thyroid: No thyromegaly.     Vascular: No JVD.  Cardiovascular:     Rate and Rhythm: Normal rate and regular rhythm.     Heart sounds: Normal heart sounds. No murmur heard.    No friction rub. No gallop.  Pulmonary:     Effort: Pulmonary effort is normal. No respiratory distress.     Breath sounds: Normal breath sounds. No wheezing or rales.  Chest:     Chest wall: No tenderness.  Abdominal:     General: Bowel sounds are normal. There is no distension.     Palpations: Abdomen is soft. There is no mass.     Tenderness: There is no abdominal tenderness. There is no guarding or rebound.  Musculoskeletal:        General: No tenderness. Normal range of motion.     Cervical  back: Normal range of motion.  Lymphadenopathy:     Cervical: No cervical adenopathy.  Skin:    General: Skin is warm and dry.     Findings: No rash.  Neurological:     Mental Status: He is alert and oriented to person, place, and time.     Cranial Nerves: No cranial nerve deficit.     Motor: No abnormal muscle tone.     Coordination: Coordination normal.     Gait: Gait normal.     Deep Tendon Reflexes: Reflexes are normal and symmetric.  Psychiatric:        Behavior: Behavior normal.        Thought Content: Thought content normal.        Judgment: Judgment normal.    Rectal - per Urology Lab Results  Component Value Date   WBC 8.2 11/02/2022   HGB 11.1 (A) 11/02/2022   HCT 33 (A) 11/02/2022   PLT 242 11/02/2022   GLUCOSE 128 (H) 12/16/2022   CHOL  144 04/01/2023   TRIG 44 04/01/2023   HDL 72 04/01/2023   LDLDIRECT 144.9 11/14/2013   LDLCALC 62 04/01/2023   ALT 15 10/23/2021   AST 22 10/23/2021   NA 136 12/16/2022   K 4.5 12/16/2022   CL 102 12/16/2022   CREATININE 3.09 (H) 12/16/2022   BUN 78 (H) 12/16/2022   CO2 27 12/16/2022   TSH 0.59 10/23/2021   PSA 11.03 (H) 10/23/2021   INR 1.02 12/09/2015   HGBA1C 5.8 (H) 12/09/2015    MM DIAG BREAST TOMO BILATERAL Result Date: 02/05/2023 CLINICAL DATA:  83 year old male with intermittent tenderness of the left nipple for 1-2 months. EXAM: DIGITAL DIAGNOSTIC BILATERAL MAMMOGRAM WITH TOMOSYNTHESIS TECHNIQUE: Bilateral digital diagnostic mammography and breast tomosynthesis was performed. COMPARISON:  Previous exam(s). ACR Breast Density Category a: The breasts are almost entirely fatty. FINDINGS: [place] Dragon shaped fibroglandular tissue is seen originating from the left nipple and extending posteriorly. This is consistent with mild left-sided gynecomastia. Otherwise, no suspicious findings in either breast. IMPRESSION: 1. Mild left-sided gynecomastia counting for the patient's clinical symptoms. 2. No mammographic evidence of malignancy in either breast. RECOMMENDATION: I discussed with the patient the fact that gynecomastia can occur in older men as testosterone levels decrease with age or in younger men with low testosterone levels, causing a change in the serum testosterone:estrogen ratio. We also discussed other potential etiologies of gynecomastia including numerous prescription medications. We also discussed the possibility of surgical excision if symptoms continue and if an etiology of the gynecomastia cannot be determined and therefore corrected. I have discussed the findings and recommendations with the patient. If applicable, a reminder letter will be sent to the patient regarding the next appointment. BI-RADS CATEGORY  2: Benign. Electronically Signed   By: Sande Brothers M.D.   On: 02/05/2023  13:27   Assessment & Plan:   Problem List Items Addressed This Visit     Dyslipidemia   Relevant Orders   TSH   Lipid panel   Essential hypertension   On NAS      Relevant Medications   tadalafil (CIALIS) 5 MG tablet   Other Relevant Orders   TSH   Urinalysis   CBC with Differential/Platelet   Lipid panel   Comprehensive metabolic panel   Well adult exam - Primary    We discussed age appropriate health related issues, including available/recomended screening tests and vaccinations. Labs were ordered to be later reviewed . All questions were answered. We discussed one  or more of the following - seat belt use, use of sunscreen/sun exposure exercise, fall risk reduction, second hand smoke exposure, firearm use and storage, seat belt use, a need for adhering to healthy diet and exercise. Labs were ordered.  All questions were answered. Today patient counseled on age appropriate routine health concerns for screening and prevention, each reviewed and up to date or declined. Immunizations reviewed and up to date or declined. Labs ordered and reviewed. Risk factors for depression reviewed and negative. Hearing function and visual acuity are intact. ADLs screened and addressed as needed. Functional ability and level of safety reviewed and appropriate. Education, counseling and referrals performed based on assessed risks today. Patient provided with a copy of personalized plan for preventive services.  Colon - only if issues CT ca score test 1703 -- 2020      Relevant Orders   TSH   Urinalysis   CBC with Differential/Platelet   Lipid panel   Comprehensive metabolic panel   CRF (chronic renal failure), stage 4 (severe) (HCC)   F/u w/Dr Calodonato q 3 months On Flomax for BPH      Relevant Orders   Lipid panel   CAD (coronary artery disease)   Cont on Pravastatin  F/u w/Dr Simona Huh      Relevant Medications   tadalafil (CIALIS) 5 MG tablet   Other Relevant Orders    Comprehensive metabolic panel   Prostate cancer (HCC)   Seeing Dr Ronne Binning q 6 months      Bladder neck obstruction   Nocturia x4-5 Will try to add Cialis 5 mg qd         Meds ordered this encounter  Medications   tadalafil (CIALIS) 5 MG tablet    Sig: Take 1 tablet (5 mg total) by mouth daily.    Dispense:  30 tablet    Refill:  5      Follow-up: Return in about 3 months (around 05/18/2024) for a follow-up visit.  Sonda Primes, MD

## 2024-02-16 NOTE — Assessment & Plan Note (Signed)
Seeing Dr Ronne Binning q 6 months

## 2024-02-16 NOTE — Telephone Encounter (Signed)
 Saa with [facility] called to report critical results of BUN 91, GFR 13.89. Repeated and read back for confirmation.   This RN reported to Dr. Cheryll Cockayne on call.   Copied from CRM [id number]. Topic: Clinical - Lab/Test Results >> Feb 16, 2024  5:29 PM Denese Killings wrote: Reason for CRM: Janina Mayo with [facility] [address] is calling to give critical results. Reason for Disposition  Lab or radiology calling with CRITICAL test results  Answer Assessment - Initial Assessment Questions 1. REASON FOR CALL or QUESTION: "What is your reason for calling today?" or "How can I best help you?" or "What question do you have that I can help answer?"     Reporting critical lab result of BUN 91, GFR 13.89 2. CALLER: Document the source of call. (e.g., laboratory, patient).    [facility]  Protocols used: PCP Call - No Triage-A-AH

## 2024-02-16 NOTE — Assessment & Plan Note (Signed)
  We discussed age appropriate health related issues, including available/recomended screening tests and vaccinations. Labs were ordered to be later reviewed . All questions were answered. We discussed one or more of the following - seat belt use, use of sunscreen/sun exposure exercise, fall risk reduction, second hand smoke exposure, firearm use and storage, seat belt use, a need for adhering to healthy diet and exercise. Labs were ordered.  All questions were answered. Today patient counseled on age appropriate routine health concerns for screening and prevention, each reviewed and up to date or declined. Immunizations reviewed and up to date or declined. Labs ordered and reviewed. Risk factors for depression reviewed and negative. Hearing function and visual acuity are intact. ADLs screened and addressed as needed. Functional ability and level of safety reviewed and appropriate. Education, counseling and referrals performed based on assessed risks today. Patient provided with a copy of personalized plan for preventive services.  Colon - only if issues CT ca score test 1703 -- 2020

## 2024-02-16 NOTE — Assessment & Plan Note (Addendum)
 Nocturia x4-5 Will try to add Cialis 5 mg qd

## 2024-02-16 NOTE — Assessment & Plan Note (Signed)
Cont on Pravastatin  F/u w/Dr Simona Huh

## 2024-02-17 LAB — URINALYSIS, ROUTINE W REFLEX MICROSCOPIC
Bilirubin Urine: NEGATIVE
Ketones, ur: NEGATIVE
Leukocytes,Ua: NEGATIVE
Nitrite: NEGATIVE
Specific Gravity, Urine: 1.01 (ref 1.000–1.030)
Total Protein, Urine: 30 — AB
Urine Glucose: NEGATIVE
Urobilinogen, UA: 0.2 (ref 0.0–1.0)
pH: 5.5 (ref 5.0–8.0)

## 2024-02-17 LAB — TSH: TSH: 0.76 u[IU]/mL (ref 0.35–5.50)

## 2024-02-18 ENCOUNTER — Encounter: Payer: Self-pay | Admitting: Internal Medicine

## 2024-02-18 DIAGNOSIS — N184 Chronic kidney disease, stage 4 (severe): Secondary | ICD-10-CM | POA: Diagnosis not present

## 2024-02-28 DIAGNOSIS — I129 Hypertensive chronic kidney disease with stage 1 through stage 4 chronic kidney disease, or unspecified chronic kidney disease: Secondary | ICD-10-CM | POA: Diagnosis not present

## 2024-02-28 DIAGNOSIS — N184 Chronic kidney disease, stage 4 (severe): Secondary | ICD-10-CM | POA: Diagnosis not present

## 2024-02-28 DIAGNOSIS — D631 Anemia in chronic kidney disease: Secondary | ICD-10-CM | POA: Diagnosis not present

## 2024-02-28 DIAGNOSIS — N2581 Secondary hyperparathyroidism of renal origin: Secondary | ICD-10-CM | POA: Diagnosis not present

## 2024-03-26 ENCOUNTER — Encounter: Payer: Self-pay | Admitting: Internal Medicine

## 2024-03-27 ENCOUNTER — Other Ambulatory Visit: Payer: Self-pay

## 2024-03-27 MED ORDER — PRAVASTATIN SODIUM 20 MG PO TABS: 20.0000 mg | ORAL_TABLET | Freq: Every day | ORAL | 3 refills | Status: AC

## 2024-04-11 ENCOUNTER — Encounter: Admitting: Vascular Surgery

## 2024-04-13 ENCOUNTER — Ambulatory Visit: Admitting: Vascular Surgery

## 2024-04-13 ENCOUNTER — Encounter: Payer: Self-pay | Admitting: Vascular Surgery

## 2024-04-13 VITALS — BP 112/64 | HR 67 | Ht 62.0 in | Wt 133.0 lb

## 2024-04-13 DIAGNOSIS — N184 Chronic kidney disease, stage 4 (severe): Secondary | ICD-10-CM

## 2024-04-13 NOTE — Progress Notes (Signed)
 [facility] [place]  ASSESSMENT / PLAN: Sergio Hughes is a 83 y.o. right handed male in need of permanent dialysis access. I reviewed options for dialysis in detail with the patient, including hemodialysis and peritoneal dialysis. I counseled the patient to ask their nephrologist about their candidacy for renal transplant. I counseled the patient that dialysis access requires surveillance and periodic maintenance. Plan to proceed with diagnostic laparoscopy, possible PD catheter placement.   Patient counseled that PD may not be feasible given history of open surgery, but he is adamant about trying.    CHIEF COMPLAINT: worsening renal function  HISTORY OF PRESENT ILLNESS: Sergio Hughes is a 83 y.o. male referred to clinic for evaluation of peritoneal dialysis catheter placement for deteriorating renal function.  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  Patient is very strongly interested in pursuing peritoneal dialysis as a way of maintaining his independence.  His surgical history is significant for a transverse colectomy performed several years ago for colonic polyps.  We had a detailed discussion about how peritoneal dialysis catheters were placed, and the risk that prior abdominal surgery poses for catheter failure.  The patient is understanding and still interested in pursuing peritoneal dialysis.   Past Medical History:  Diagnosis Date   Bilateral carotid bruits    us  carotid bilateral 05-14-2022 epic   Chronic kidney disease stage 4    Colon polyps    Coronary artery calcification seen on CT scan    Hyperlipidemia    Hypertension    per pt on 08-25-2022 off amlodipine  last 3 to 4 months per nephrology dr Juanell Nora   Osteoarthritis    Prostate cancer [facility])    RBBB     Past Surgical History:  Procedure Laterality Date   COLONOSCOPY      CYSTOSCOPY N/A 09/03/2022   Procedure: CYSTOSCOPY;  Surgeon: Marco Severs, MD;  Location: [facility];  Service: Urology;  Laterality: N/A;   INGUINAL HERNIA REPAIR  2005   Right   LESION REMOVAL Left 09/23/2020   Procedure: MINOR EXICISION OF LESION OF LEFT EAR;  Surgeon: Janita Mellow, MD;  Location: [facility];  Service: ENT;  Laterality: Left;   PARTIAL COLECTOMY Right 12/16/2015   Procedure: PARTIAL CECECTOMY AND APPENDECTOMY;  Surgeon: Jerryl Morin, MD;  Location: [facility] OR;  Service: General;  Laterality: Right;   POLYPECTOMY     RADIOACTIVE SEED IMPLANT N/A 09/03/2022   Procedure: RADIOACTIVE SEED IMPLANT/BRACHYTHERAPY IMPLANT;  Surgeon: Marco Severs, MD;  Location: [facility];  Service: Urology;  Laterality: N/A;   SPACE OAR INSTILLATION N/A 09/03/2022   Procedure: SPACE OAR INSTILLATION;  Surgeon: Marco Severs, MD;  Location: [facility];  Service: Urology;  Laterality: N/A;   VASECTOMY      Family History  Problem Relation Age of Onset   Diverticulitis Mother    Hypertension Father    Cancer Father        brain ca   Kidney disease Brother 32   Colon cancer Brother    Rectal cancer Neg Hx    Stomach cancer Neg Hx    Esophageal cancer Neg Hx     Social History   Socioeconomic History   Marital status: Married    Spouse name: Willoughby Doell   Number of children: 2  Years of education: college    Highest education level: Bachelor's degree (e.g., BA, AB, BS)  Occupational History   Occupation: retired  Tobacco Use   Smoking status: Former    Current packs/day: 0.00    Average packs/day: 0.5 packs/day for 10.0 years (5.0 ttl pk-yrs)    Types: Cigarettes    Start date: 12/15/1955    Quit date: 12/14/1965    Years since quitting: 58.3   Smokeless tobacco: Never  Vaping Use   Vaping status: Never Used  Substance and Sexual Activity   Alcohol use: Not Currently   Drug use: No   Sexual  activity: Not Currently  Other Topics Concern   Not on file  Social History Narrative   Regular exercise - YES - golf   Lives with wife/he is her caretaker   Social Drivers of Corporate investment banker Strain: Low Risk  (12/27/2023)   Overall Financial Resource Strain (CARDIA)    Difficulty of Paying Living Expenses: Not hard at all  Food Insecurity: No Food Insecurity (12/27/2023)   Hunger Vital Sign    Worried About Running Out of Food in the Last Year: Never true    Ran Out of Food in the Last Year: Never true  Transportation Needs: No Transportation Needs (12/27/2023)   PRAPARE - Administrator, Civil Service (Medical): No    Lack of Transportation (Non-Medical): No  Physical Activity: Inactive (12/27/2023)   Exercise Vital Sign    Days of Exercise per Week: 0 days    Minutes of Exercise per Session: 0 min  Stress: No Stress Concern Present (12/27/2023)   [employer] of Occupational Health - Occupational Stress Questionnaire    Feeling of Stress : Not at all  Recent Concern: Stress - Stress Concern Present (11/18/2023)   [employer] of Occupational Health - Occupational Stress Questionnaire    Feeling of Stress : To some extent  Social Connections: Moderately Integrated (12/27/2023)   Social Connection and Isolation Panel [NHANES]    Frequency of Communication with Friends and Family: More than three times a week    Frequency of Social Gatherings with Friends and Family: Twice a week    Attends Religious Services: Never    Database administrator or Organizations: Yes    Attends Banker Meetings: Never    Marital Status: Married  Catering manager Violence: Not At Risk (12/27/2023)   Humiliation, Afraid, Rape, and Kick questionnaire    Fear of Current or Ex-Partner: No    Emotionally Abused: No    Physically Abused: No    Sexually Abused: No    Allergies  Allergen Reactions   Bupropion Hcl     REACTION: extreme dizziness   Lovastatin      myalgias   Uroxatral  [Alfuzosin ]     Weakness, tinnitus    Current Outpatient Medications  Medication Sig Dispense Refill   Cholecalciferol  (VITAMIN D ) 2000 UNITS CAPS Take 1 capsule by mouth daily.     cyanocobalamin (VITAMIN B12) 1000 MCG tablet      Glucosamine-Chondroit-Vit C-Mn (GLUCOSAMINE 1500 COMPLEX) CAPS Take by mouth daily at 2 PM.     Omega-3 Fatty Acids (FISH OIL) 1000 MG CAPS 1,200 mg daily at 2 PM.     pravastatin  (PRAVACHOL ) 20 MG tablet Take 1 tablet (20 mg total) by mouth daily. 90 tablet 3   tadalafil  (CIALIS ) 5 MG tablet Take 1 tablet (5 mg total) by mouth daily. 30 tablet 5   tamsulosin  (  FLOMAX ) 0.4 MG CAPS capsule Take 1 capsule by mouth twice daily 60 capsule 0   triamcinolone  cream (KENALOG ) 0.1 % SMARTSIG:1 Application Topical 2-3 Times Daily     No current facility-administered medications for this visit.    PHYSICAL EXAM Vitals:   04/13/24 1320  BP: 112/64  Pulse: 67  SpO2: 98%  Weight: 133 lb (60.3 kg)  Height: 5\' 2"  (1.575 m)   Elderly gentleman in no distress Regular rate and rhythm Unlabored breathing Well-healed transverse incision in the right lateral abdomen   PERTINENT LABORATORY AND RADIOLOGIC DATA  Most recent CBC    Latest Ref Rng & Units 02/16/2024    3:17 PM 11/02/2022   12:00 AM 09/03/2022   12:12 PM  CBC  WBC 4.0 - 10.5 K/uL 8.9  8.2       Hemoglobin 13.0 - 17.0 g/dL 16.1  09.6     04.5   Hematocrit 39.0 - 52.0 % 36.5  33     35.0   Platelets 150.0 - 400.0 K/uL 180.0  242          This result is from an external source.     Most recent CMP    Latest Ref Rng & Units 02/16/2024    3:17 PM 12/16/2022    2:16 PM 11/02/2022   12:00 AM  CMP  Glucose 70 - 99 mg/dL 409  811    BUN 6 - 23 mg/dL 91  78  81      Creatinine 0.40 - 1.50 mg/dL 9.14  7.82  3.0      Sodium 135 - 145 mEq/L 137  136  138      Potassium 3.5 - 5.1 mEq/L 4.5  4.5  5.0      Chloride 96 - 112 mEq/L 102  102  101      CO2 19 - 32 mEq/L 27  27  24        Calcium  8.4 - 10.5 mg/dL 9.0  9.1  9.3      Total Protein 6.0 - 8.3 g/dL 7.1     Total Bilirubin 0.2 - 1.2 mg/dL 0.3     Alkaline Phos 39 - 117 U/L 73     AST 0 - 37 U/L 19     ALT 0 - 53 U/L 14        This result is from an external source.    Renal function CrCl cannot be calculated (Patient's most recent lab result is older than the maximum 21 days allowed.).  Hgb A1c MFr Bld (%)  Date Value  12/09/2015 5.8 (H)    LDL Chol Calc (NIH)  Date Value Ref Range Status  04/01/2023 62 0 - 99 mg/dL Final   LDL Cholesterol  Date Value Ref Range Status  02/16/2024 71 0 - 99 mg/dL Final   Direct LDL  Date Value Ref Range Status  11/14/2013 144.9 mg/dL Final    Comment:    Optimal:  <100 mg/dLNear or Above Optimal:  100-129 mg/dLBorderline High:  130-159 mg/dLHigh:  160-189 mg/dLVery High:  >190 mg/dL     Heber Little. Edgardo Goodwill, MD [facility] Phone Number: [phone] 04/13/2024 1:43 PM   Total time spent on preparing this encounter including chart review, data review, collecting history, examining the patient, and coordinating care: 45 minutes  Portions of this report may have been transcribed using voice recognition software.  Every effort has been made to ensure accuracy; however, inadvertent  computerized transcription errors may still be present.

## 2024-04-14 ENCOUNTER — Telehealth: Payer: Self-pay

## 2024-04-14 NOTE — Telephone Encounter (Signed)
 Spoke to patient re: scheduling of PD catheter placement.  Patient states that he has a nephrology appt on 6/9 and will follow back up with us  after.

## 2024-04-24 DIAGNOSIS — N184 Chronic kidney disease, stage 4 (severe): Secondary | ICD-10-CM | POA: Diagnosis not present

## 2024-04-25 ENCOUNTER — Other Ambulatory Visit: Payer: Self-pay | Admitting: Urology

## 2024-05-01 DIAGNOSIS — D631 Anemia in chronic kidney disease: Secondary | ICD-10-CM | POA: Diagnosis not present

## 2024-05-01 DIAGNOSIS — N184 Chronic kidney disease, stage 4 (severe): Secondary | ICD-10-CM | POA: Diagnosis not present

## 2024-05-01 DIAGNOSIS — N2581 Secondary hyperparathyroidism of renal origin: Secondary | ICD-10-CM | POA: Diagnosis not present

## 2024-05-01 DIAGNOSIS — I129 Hypertensive chronic kidney disease with stage 1 through stage 4 chronic kidney disease, or unspecified chronic kidney disease: Secondary | ICD-10-CM | POA: Diagnosis not present

## 2024-05-02 DIAGNOSIS — N184 Chronic kidney disease, stage 4 (severe): Secondary | ICD-10-CM | POA: Diagnosis not present

## 2024-05-17 ENCOUNTER — Telehealth: Payer: Self-pay

## 2024-05-17 ENCOUNTER — Encounter: Payer: Self-pay | Admitting: Internal Medicine

## 2024-05-17 ENCOUNTER — Ambulatory Visit (INDEPENDENT_AMBULATORY_CARE_PROVIDER_SITE_OTHER): Admitting: Internal Medicine

## 2024-05-17 VITALS — BP 110/70 | HR 70 | Temp 97.9°F | Ht 62.0 in | Wt 130.0 lb

## 2024-05-17 DIAGNOSIS — I10 Essential (primary) hypertension: Secondary | ICD-10-CM | POA: Diagnosis not present

## 2024-05-17 DIAGNOSIS — K219 Gastro-esophageal reflux disease without esophagitis: Secondary | ICD-10-CM

## 2024-05-17 DIAGNOSIS — E785 Hyperlipidemia, unspecified: Secondary | ICD-10-CM | POA: Diagnosis not present

## 2024-05-17 DIAGNOSIS — I2510 Atherosclerotic heart disease of native coronary artery without angina pectoris: Secondary | ICD-10-CM | POA: Diagnosis not present

## 2024-05-17 DIAGNOSIS — N184 Chronic kidney disease, stage 4 (severe): Secondary | ICD-10-CM

## 2024-05-17 DIAGNOSIS — N528 Other male erectile dysfunction: Secondary | ICD-10-CM | POA: Diagnosis not present

## 2024-05-17 NOTE — Assessment & Plan Note (Signed)
Cialis prn 

## 2024-05-17 NOTE — Assessment & Plan Note (Signed)
Cont on Pravastatin  F/u w/Dr Simona Huh

## 2024-05-17 NOTE — Assessment & Plan Note (Addendum)
 F/u w/Dr Calodonato q 6 wks. GFR 16 On Flomax  for BPH

## 2024-05-17 NOTE — Assessment & Plan Note (Signed)
Cont on Pepcid 

## 2024-05-17 NOTE — Telephone Encounter (Signed)
 Patient has not called office to schedule peritoneal dialysis catheter placement with Dr Magda.  Letter sent.

## 2024-05-17 NOTE — Progress Notes (Signed)
 Subjective:  Patient ID: Sergio Hughes, male    DOB: October 23, 1941  Age: 83 y.o. MRN: [id number]  CC: Medical Management of Chronic Issues   HPI [name] presents for HTN, CAD, dyslipidemia  Outpatient Medications Prior to Visit  Medication Sig Dispense Refill   Cholecalciferol  (VITAMIN D ) 2000 UNITS CAPS Take 1 capsule by mouth daily.     cyanocobalamin (VITAMIN B12) 1000 MCG tablet      Glucosamine-Chondroit-Vit C-Mn (GLUCOSAMINE 1500 COMPLEX) CAPS Take by mouth daily at 2 PM.     Omega-3 Fatty Acids (FISH OIL) 1000 MG CAPS 1,200 mg daily at 2 PM.     pravastatin  (PRAVACHOL ) 20 MG tablet Take 1 tablet (20 mg total) by mouth daily. 90 tablet 3   tamsulosin  (FLOMAX ) 0.4 MG CAPS capsule Take 1 capsule by mouth twice daily 60 capsule 0   tadalafil  (CIALIS ) 5 MG tablet Take 1 tablet (5 mg total) by mouth daily. 30 tablet 5   triamcinolone  cream (KENALOG ) 0.1 % SMARTSIG:1 Application Topical 2-3 Times Daily     No facility-administered medications prior to visit.    ROS: Review of Systems  Constitutional:  Negative for appetite change, fatigue and unexpected weight change.  HENT:  Negative for congestion, nosebleeds, sneezing, sore throat and trouble swallowing.   Eyes:  Negative for itching and visual disturbance.  Respiratory:  Negative for cough.   Cardiovascular:  Negative for chest pain, palpitations and leg swelling.  Gastrointestinal:  Negative for abdominal distention, blood in stool, diarrhea and nausea.  Genitourinary:  Negative for frequency and hematuria.  Musculoskeletal:  Positive for arthralgias and back pain. Negative for gait problem, joint swelling and neck pain.  Skin:  Negative for rash.  Neurological:  Negative for dizziness, tremors, speech difficulty and weakness.  Psychiatric/Behavioral:  Negative for agitation, dysphoric mood, sleep disturbance and suicidal ideas. The patient is not nervous/anxious.     Objective:  BP 110/70   Pulse 70   Temp 97.9 F  (36.6 C) (Oral)   Ht 5' 2 (1.575 m)   Wt 130 lb (59 kg)   SpO2 96%   BMI 23.78 kg/m   BP Readings from Last 3 Encounters:  05/17/24 110/70  04/13/24 112/64  02/16/24 (!) 106/58    Wt Readings from Last 3 Encounters:  05/17/24 130 lb (59 kg)  04/13/24 133 lb (60.3 kg)  02/16/24 137 lb 12.8 oz (62.5 kg)    Physical Exam Constitutional:      General: He is not in acute distress.    Appearance: He is well-developed. He is obese.     Comments: NAD   Eyes:     Conjunctiva/sclera: Conjunctivae normal.     Pupils: Pupils are equal, round, and reactive to light.   Neck:     Thyroid : No thyromegaly.     Vascular: No JVD.   Cardiovascular:     Rate and Rhythm: Normal rate and regular rhythm.     Heart sounds: Normal heart sounds. No murmur heard.    No friction rub. No gallop.  Pulmonary:     Effort: Pulmonary effort is normal. No respiratory distress.     Breath sounds: Normal breath sounds. No wheezing or rales.  Chest:     Chest wall: No tenderness.  Abdominal:     General: Bowel sounds are normal. There is no distension.     Palpations: Abdomen is soft. There is no mass.     Tenderness: There is no abdominal tenderness. There is  no guarding or rebound.   Musculoskeletal:        General: No tenderness. Normal range of motion.     Cervical back: Normal range of motion.  Lymphadenopathy:     Cervical: No cervical adenopathy.   Skin:    General: Skin is warm and dry.     Findings: No rash.   Neurological:     Mental Status: He is alert and oriented to person, place, and time.     Cranial Nerves: No cranial nerve deficit.     Motor: No abnormal muscle tone.     Coordination: Coordination normal.     Gait: Gait normal.     Deep Tendon Reflexes: Reflexes are normal and symmetric.   Psychiatric:        Behavior: Behavior normal.        Thought Content: Thought content normal.        Judgment: Judgment normal.     Lab Results  Component Value Date   WBC 8.9  02/16/2024   HGB 12.0 (L) 02/16/2024   HCT 36.5 (L) 02/16/2024   PLT 180.0 02/16/2024   GLUCOSE 138 (H) 02/16/2024   CHOL 141 02/16/2024   TRIG 62.0 02/16/2024   HDL 57.60 02/16/2024   LDLDIRECT 144.9 11/14/2013   LDLCALC 71 02/16/2024   ALT 14 02/16/2024   AST 19 02/16/2024   NA 137 02/16/2024   K 4.5 02/16/2024   CL 102 02/16/2024   CREATININE 3.85 (H) 02/16/2024   BUN 91 (HH) 02/16/2024   CO2 27 02/16/2024   TSH 0.76 02/16/2024   PSA 11.03 (H) 10/23/2021   INR 1.02 12/09/2015   HGBA1C 5.8 (H) 12/09/2015    MM DIAG BREAST TOMO BILATERAL Result Date: 02/05/2023 CLINICAL DATA:  83 year old male with intermittent tenderness of the left nipple for 1-2 months. EXAM: DIGITAL DIAGNOSTIC BILATERAL MAMMOGRAM WITH TOMOSYNTHESIS TECHNIQUE: Bilateral digital diagnostic mammography and breast tomosynthesis was performed. COMPARISON:  Previous exam(s). ACR Breast Density Category a: The breasts are almost entirely fatty. FINDINGS: Trinna shaped fibroglandular tissue is seen originating from the left nipple and extending posteriorly. This is consistent with mild left-sided gynecomastia. Otherwise, no suspicious findings in either breast. IMPRESSION: 1. Mild left-sided gynecomastia counting for the patient's clinical symptoms. 2. No mammographic evidence of malignancy in either breast. RECOMMENDATION: I discussed with the patient the fact that gynecomastia can occur in older men as testosterone levels decrease with age or in younger men with low testosterone levels, causing a change in the serum testosterone:estrogen ratio. We also discussed other potential etiologies of gynecomastia including numerous prescription medications. We also discussed the possibility of surgical excision if symptoms continue and if an etiology of the gynecomastia cannot be determined and therefore corrected. I have discussed the findings and recommendations with the patient. If applicable, a reminder letter will be sent to the  patient regarding the next appointment. BI-RADS CATEGORY  2: Benign. Electronically Signed   By: Serena  Chacko M.D.   On: 02/05/2023 13:27   Assessment & Plan:   Problem List Items Addressed This Visit     Dyslipidemia   Cont with Pravastatin        Essential hypertension   On NAS      Erectile dysfunction   Cialis  prn      CRF (chronic renal failure), stage 4 (severe) (HCC) - Primary   F/u w/Dr Calodonato q 6 wks. GFR 16 On Flomax  for BPH      CAD (coronary artery disease)   Cont on  Pravastatin   F/u w/Dr Sheppard Sierras      GERD (gastroesophageal reflux disease)   Cont on Pepcid          No orders of the defined types were placed in this encounter.     Follow-up: Return in about 4 months (around 09/16/2024) for a follow-up visit.  Marolyn Noel, MD

## 2024-05-17 NOTE — Assessment & Plan Note (Signed)
On NAS

## 2024-05-17 NOTE — Assessment & Plan Note (Signed)
Cont with Pravastatin

## 2024-06-01 ENCOUNTER — Telehealth: Payer: Self-pay

## 2024-06-01 NOTE — Telephone Encounter (Signed)
 Patient states that he has a visit with Dr. Dennise coming up and he will decide if he'd like to proceed with PD Cath Placement after that visit.  Patient was advised to call when ready to schedule.

## 2024-06-03 ENCOUNTER — Other Ambulatory Visit: Payer: Self-pay | Admitting: Urology

## 2024-06-12 DIAGNOSIS — N184 Chronic kidney disease, stage 4 (severe): Secondary | ICD-10-CM | POA: Diagnosis not present

## 2024-06-19 DIAGNOSIS — I129 Hypertensive chronic kidney disease with stage 1 through stage 4 chronic kidney disease, or unspecified chronic kidney disease: Secondary | ICD-10-CM | POA: Diagnosis not present

## 2024-06-19 DIAGNOSIS — N2581 Secondary hyperparathyroidism of renal origin: Secondary | ICD-10-CM | POA: Diagnosis not present

## 2024-06-19 DIAGNOSIS — D631 Anemia in chronic kidney disease: Secondary | ICD-10-CM | POA: Diagnosis not present

## 2024-06-19 DIAGNOSIS — E785 Hyperlipidemia, unspecified: Secondary | ICD-10-CM | POA: Diagnosis not present

## 2024-06-19 DIAGNOSIS — N184 Chronic kidney disease, stage 4 (severe): Secondary | ICD-10-CM | POA: Diagnosis not present

## 2024-06-30 DIAGNOSIS — H5310 Unspecified subjective visual disturbances: Secondary | ICD-10-CM | POA: Diagnosis not present

## 2024-06-30 DIAGNOSIS — Z961 Presence of intraocular lens: Secondary | ICD-10-CM | POA: Diagnosis not present

## 2024-06-30 DIAGNOSIS — H524 Presbyopia: Secondary | ICD-10-CM | POA: Diagnosis not present

## 2024-07-12 ENCOUNTER — Ambulatory Visit: Admitting: Cardiology

## 2024-07-13 ENCOUNTER — Ambulatory Visit: Attending: Cardiology | Admitting: Cardiology

## 2024-07-13 ENCOUNTER — Encounter: Payer: Self-pay | Admitting: Cardiology

## 2024-07-13 VITALS — BP 110/58 | HR 64 | Ht 65.0 in | Wt 135.8 lb

## 2024-07-13 DIAGNOSIS — I2510 Atherosclerotic heart disease of native coronary artery without angina pectoris: Secondary | ICD-10-CM

## 2024-07-13 DIAGNOSIS — N184 Chronic kidney disease, stage 4 (severe): Secondary | ICD-10-CM | POA: Diagnosis not present

## 2024-07-13 DIAGNOSIS — I10 Essential (primary) hypertension: Secondary | ICD-10-CM

## 2024-07-13 NOTE — Patient Instructions (Signed)
 Medication Instructions:  Your physician recommends that you continue on your current medications as directed. Please refer to the Current Medication list given to you today.  *If you need a refill on your cardiac medications before your next appointment, please call your pharmacy*  Lab Work: NONE   If you have labs (blood work) drawn today and your tests are completely normal, you will receive your results only by: MyChart Message (if you have MyChart) OR A paper copy in the mail If you have any lab test that is abnormal or we need to change your treatment, we will call you to review the results.  Testing/Procedures: NONE  Follow-Up: At [facility], you and your health needs are our priority.  As part of our continuing mission to provide you with exceptional heart care, our providers are all part of one team.  This team includes your primary Cardiologist (physician) and Advanced Practice Providers or APPs (Physician Assistants and Nurse Practitioners) who all work together to provide you with the care you need, when you need it.  Your next appointment:   1 year(s)  Provider:   Jayson Sierras, MD    We recommend signing up for the patient portal called MyChart.  Sign up information is provided on this After Visit Summary.  MyChart is used to connect with patients for Virtual Visits (Telemedicine).  Patients are able to view lab/test results, encounter notes, upcoming appointments, etc.  Non-urgent messages can be sent to your provider as well.   To learn more about what you can do with MyChart, go to [email or web address].   Other Instructions Thank you for choosing [facility]!

## 2024-07-13 NOTE — Progress Notes (Signed)
    Cardiology Office Note  Date: 07/13/2024   ID: Tyronne, Blann [birth year], MRN [id number]  History of Present Illness: Sergio Hughes is an 83 y.o. male last seen in May 2024 by Ms. Strader PA-C, I reviewed her note.  Our last visit was in 2021.  He is here for a routine visit.  Reports no exertional chest pain or dyspnea beyond NYHA class II.  Does have limitations related to arthritis, but remains active.  He is primary caregiver for his wife with dementia.  We reviewed his medications.  He reports compliance with current regimen, no obvious intolerances.  He is not on any antihypertensive medications at this point.  Continues to follow regularly with nephrology.  I reviewed his interval lab work.  I reviewed his ECG today which shows sinus rhythm with right bundle branch block.  Physical Exam: VS:  BP (!) 110/58 (BP Location: Left Arm, Patient Position: Sitting, Cuff Size: Normal)   Pulse 64   Ht 5' 5 (1.651 m)   Wt 135 lb 12.8 oz (61.6 kg)   SpO2 98%   BMI 22.60 kg/m , BMI Body mass index is 22.6 kg/m.  Wt Readings from Last 3 Encounters:  07/13/24 135 lb 12.8 oz (61.6 kg)  05/17/24 130 lb (59 kg)  04/13/24 133 lb (60.3 kg)    General: Patient appears comfortable at rest. HEENT: Conjunctiva and lids normal. Neck: Supple, no elevated JVP or carotid bruits. Lungs: Clear to auscultation, nonlabored breathing at rest. Cardiac: Regular rate and rhythm, no S3, 2/6 apical systolic murmur, no pericardial rub. Extremities: No pitting edema.  ECG:  An ECG dated 08/28/2022 was personally reviewed today and demonstrated:  Sinus rhythm with right bundle branch block.  Labwork: 02/16/2024: ALT 14; AST 19; BUN 91; Creatinine, Ser 3.85; Hemoglobin 12.0; Platelets 180.0; Potassium 4.5; Sodium 137; TSH 0.76     Component Value Date/Time   CHOL 141 02/16/2024 1517   CHOL 144 04/01/2023 0740   TRIG 62.0 02/16/2024 1517   HDL 57.60 02/16/2024 1517   HDL 72 04/01/2023 0740   CHOLHDL 2  02/16/2024 1517   VLDL 12.4 02/16/2024 1517   LDLCALC 71 02/16/2024 1517   LDLCALC 62 04/01/2023 0740   LDLDIRECT 144.9 11/14/2013 0859  July 2025: BUN 81, creatinine 3.65, GFR 16, potassium 4.8  Other Studies Reviewed Today:  No interval cardiac testing for review today.  Assessment and Plan:  1.  Coronary artery calcification by CT imaging, calcium  score 1709 in 2020.  Myoview  at that time was low risk with small region of anteroapical ischemia and LVEF 69%.  He does not report any angina and remains functional with ADLs, NYHA class II dyspnea.  ECG reviewed and stable.  Plan to continue Pravachol  20 mg daily.  2.  Mixed hyperlipidemia.  LDL 71 in March.  Continue Pravachol  20 mg daily and omega-3 supplements 1200 mg daily.  3.  CKD stage IV followed by Dr. Rayburn.  Recent creatinine 3.65 with GFR 16.  Disposition:  Follow up 1 year.  Signed, Jayson JUDITHANN Sierras, M.D., F.A.C.C. [facility]

## 2024-07-17 DIAGNOSIS — N184 Chronic kidney disease, stage 4 (severe): Secondary | ICD-10-CM | POA: Diagnosis not present

## 2024-07-18 DIAGNOSIS — N184 Chronic kidney disease, stage 4 (severe): Secondary | ICD-10-CM | POA: Diagnosis not present

## 2024-07-31 DIAGNOSIS — N2581 Secondary hyperparathyroidism of renal origin: Secondary | ICD-10-CM | POA: Diagnosis not present

## 2024-07-31 DIAGNOSIS — N185 Chronic kidney disease, stage 5: Secondary | ICD-10-CM | POA: Diagnosis not present

## 2024-07-31 DIAGNOSIS — I120 Hypertensive chronic kidney disease with stage 5 chronic kidney disease or end stage renal disease: Secondary | ICD-10-CM | POA: Diagnosis not present

## 2024-07-31 DIAGNOSIS — E785 Hyperlipidemia, unspecified: Secondary | ICD-10-CM | POA: Diagnosis not present

## 2024-07-31 DIAGNOSIS — N184 Chronic kidney disease, stage 4 (severe): Secondary | ICD-10-CM | POA: Diagnosis not present

## 2024-07-31 DIAGNOSIS — D631 Anemia in chronic kidney disease: Secondary | ICD-10-CM | POA: Diagnosis not present

## 2024-08-01 ENCOUNTER — Other Ambulatory Visit: Payer: Self-pay | Admitting: Urology

## 2024-08-07 ENCOUNTER — Other Ambulatory Visit

## 2024-08-07 DIAGNOSIS — C61 Malignant neoplasm of prostate: Secondary | ICD-10-CM

## 2024-08-08 LAB — PSA: Prostate Specific Ag, Serum: 0.1 ng/mL (ref 0.0–4.0)

## 2024-08-10 ENCOUNTER — Ambulatory Visit: Payer: Self-pay | Admitting: Urology

## 2024-08-14 ENCOUNTER — Ambulatory Visit: Admitting: Urology

## 2024-08-14 VITALS — BP 123/69 | HR 67

## 2024-08-14 DIAGNOSIS — R351 Nocturia: Secondary | ICD-10-CM

## 2024-08-14 DIAGNOSIS — C61 Malignant neoplasm of prostate: Secondary | ICD-10-CM | POA: Diagnosis not present

## 2024-08-14 DIAGNOSIS — N401 Enlarged prostate with lower urinary tract symptoms: Secondary | ICD-10-CM | POA: Diagnosis not present

## 2024-08-14 LAB — URINALYSIS, ROUTINE W REFLEX MICROSCOPIC
Bilirubin, UA: NEGATIVE
Glucose, UA: NEGATIVE
Ketones, UA: NEGATIVE
Leukocytes,UA: NEGATIVE
Nitrite, UA: NEGATIVE
Specific Gravity, UA: 1.01 (ref 1.005–1.030)
Urobilinogen, Ur: 0.2 mg/dL (ref 0.2–1.0)
pH, UA: 6 (ref 5.0–7.5)

## 2024-08-14 LAB — MICROSCOPIC EXAMINATION
Bacteria, UA: NONE SEEN
Epithelial Cells (non renal): NONE SEEN /HPF (ref 0–10)
WBC, UA: NONE SEEN /HPF (ref 0–5)

## 2024-08-14 MED ORDER — TAMSULOSIN HCL 0.4 MG PO CAPS: 0.4000 mg | ORAL_CAPSULE | Freq: Two times a day (BID) | ORAL | 3 refills | Status: AC

## 2024-08-14 NOTE — Progress Notes (Unsigned)
 08/14/2024 2:02 PM   Sergio Hughes 1941/09/13 [id number]  Referring provider: Garald Karlynn GAILS, MD [address] [place],  [place] [address]  No chief complaint on file.   HPI: [name] is a 83yo here for followup for prostate cancer and BPH with nocturia. PSA 0.1. He drinks over 100oz of water  daily. He has nocturia 4-5x which is a large amount. Urine stream strong. No straining to urinate. IPSS 10 QOl 2 on flomax  0.4mg  BID.    PMH: Past Medical History:  Diagnosis Date   Bilateral carotid bruits    us  carotid bilateral 05-14-2022 epic   Chronic kidney disease stage 4    Colon polyps    Coronary artery calcification seen on CT scan    Hyperlipidemia    Hypertension    per pt on 08-25-2022 off amlodipine  last 3 to 4 months per nephrology dr leeanne   Osteoarthritis    Prostate cancer [facility])    RBBB     Surgical History: Past Surgical History:  Procedure Laterality Date   COLONOSCOPY     CYSTOSCOPY N/A 09/03/2022   Procedure: CYSTOSCOPY;  Surgeon: Sherrilee Belvie CROME, MD;  Location: [facility];  Service: Urology;  Laterality: N/A;   INGUINAL HERNIA REPAIR  2005   Right   LESION REMOVAL Left 09/23/2020   Procedure: MINOR EXICISION OF LESION OF LEFT EAR;  Surgeon: Jesus Oliphant, MD;  Location: [facility];  Service: ENT;  Laterality: Left;   PARTIAL COLECTOMY Right 12/16/2015   Procedure: PARTIAL CECECTOMY AND APPENDECTOMY;  Surgeon: Sergio Pina, MD;  Location: [facility] OR;  Service: General;  Laterality: Right;   POLYPECTOMY     RADIOACTIVE SEED IMPLANT N/A 09/03/2022   Procedure: RADIOACTIVE SEED IMPLANT/BRACHYTHERAPY IMPLANT;  Surgeon: Sherrilee Belvie CROME, MD;  Location: [facility];  Service: Urology;  Laterality: N/A;   SPACE OAR INSTILLATION N/A 09/03/2022   Procedure: SPACE OAR INSTILLATION;  Surgeon: Sherrilee Belvie CROME, MD;  Location: [facility];  Service: Urology;  Laterality: N/A;   VASECTOMY       Home Medications:  Allergies as of 08/14/2024       Reactions   Bupropion Hcl    REACTION: extreme dizziness   Lovastatin    myalgias   Uroxatral  [alfuzosin ]    Weakness, tinnitus        Medication List        Accurate as of August 14, 2024  2:02 PM. If you have any questions, ask your nurse or doctor.          cyanocobalamin 1000 MCG tablet Commonly known as: VITAMIN B12   Fish Oil 1000 MG Caps 1,200 mg daily at 2 PM.   Glucosamine 1500 Complex Caps Take by mouth daily at 2 PM.   pravastatin  20 MG tablet Commonly known as: PRAVACHOL  Take 1 tablet (20 mg total) by mouth daily.   tamsulosin  0.4 MG Caps capsule Commonly known as: FLOMAX  Take 1 capsule by mouth twice daily   Vitamin D  50 MCG (2000 UT) Caps Take 1 capsule by mouth daily.        Allergies:  Allergies  Allergen Reactions   Bupropion Hcl     REACTION: extreme dizziness   Lovastatin     myalgias   Uroxatral  [Alfuzosin ]     Weakness, tinnitus    Family History: Family History  Problem Relation Age of Onset   Diverticulitis Mother    Hypertension Father    Cancer Father  brain ca   Kidney disease Brother 71   Colon cancer Brother    Rectal cancer Neg Hx    Stomach cancer Neg Hx    Esophageal cancer Neg Hx     Social History:  reports that he quit smoking about 58 years ago. His smoking use included cigarettes. He started smoking about 68 years ago. He has a 5 pack-year smoking history. He has never used smokeless tobacco. He reports that he does not currently use alcohol. He reports that he does not use drugs.  ROS: All other review of systems were reviewed and are negative except what is noted above in HPI  Physical Exam: BP 123/69   Pulse 67   Constitutional:  Alert and oriented, No acute distress. HEENT: [place] AT, moist mucus membranes.  Trachea midline, no masses. Cardiovascular: No clubbing, cyanosis, or edema. Respiratory: Normal respiratory effort, no  increased work of breathing. GI: Abdomen is soft, nontender, nondistended, no abdominal masses GU: No CVA tenderness.  Lymph: No cervical or inguinal lymphadenopathy. Skin: No rashes, bruises or suspicious lesions. Neurologic: Grossly intact, no focal deficits, moving all 4 extremities. Psychiatric: Normal mood and affect.  Laboratory Data: Lab Results  Component Value Date   WBC 8.9 02/16/2024   HGB 12.0 (L) 02/16/2024   HCT 36.5 (L) 02/16/2024   MCV 92.2 02/16/2024   PLT 180.0 02/16/2024    Lab Results  Component Value Date   CREATININE 3.85 (H) 02/16/2024    Lab Results  Component Value Date   PSA 11.03 (H) 10/23/2021   PSA 6.35 (H) 09/29/2017   PSA 6.51 (H) 05/24/2017    No results found for: TESTOSTERONE  Lab Results  Component Value Date   HGBA1C 5.8 (H) 12/09/2015    Urinalysis    Component Value Date/Time   COLORURINE YELLOW 02/16/2024 1517   APPEARANCEUR CLEAR 02/16/2024 1517   APPEARANCEUR Clear 01/31/2024 1406   LABSPEC 1.010 02/16/2024 1517   PHURINE 5.5 02/16/2024 1517   GLUCOSEU NEGATIVE 02/16/2024 1517   HGBUR TRACE-INTACT (A) 02/16/2024 1517   BILIRUBINUR NEGATIVE 02/16/2024 1517   BILIRUBINUR Negative 01/31/2024 1406   KETONESUR NEGATIVE 02/16/2024 1517   PROTEINUR Trace 01/31/2024 1406   UROBILINOGEN 0.2 02/16/2024 1517   NITRITE NEGATIVE 02/16/2024 1517   LEUKOCYTESUR NEGATIVE 02/16/2024 1517    Lab Results  Component Value Date   LABMICR Comment 01/31/2024   WBCUA None seen 07/21/2023   LABEPIT 0-10 07/21/2023   MUCUS Present 06/12/2022   BACTERIA None seen 07/21/2023    Pertinent Imaging: *** No results found for this or any previous visit.  No results found for this or any previous visit.  No results found for this or any previous visit.  No results found for this or any previous visit.  Results for orders placed during the hospital encounter of 05/14/22  US  RENAL  Narrative CLINICAL DATA:  Chronic renal  disease  EXAM: RENAL / URINARY TRACT ULTRASOUND COMPLETE  COMPARISON:  Renal ultrasound 05/10/2020  FINDINGS: Right Kidney:  Renal measurements: 8.5 x 4.0 x 3.8 cm = volume: 67.2 mL. Renal cortical thinning. Increased cortical echogenicity. No hydronephrosis.  Left Kidney:  Renal measurements: 9.5 x 4.6 x 4.2 cm = volume: 94.2 mL. Renal cortical thinning. Increased renal cortical echogenicity. No hydronephrosis. Probable 3 mm stone inferior pole.  Bladder:  Appears normal for degree of bladder distention.  Other:  None.  IMPRESSION: No hydronephrosis.  Increased cortical echogenicity compatible with chronic medical renal disease.   Electronically Signed By:  Bard Moats M.D. On: 05/14/2022 15:05  No results found for this or any previous visit.  No results found for this or any previous visit.  No results found for this or any previous visit.   Assessment & Plan:    1. Prostate cancer (HCC) (Primary) *** - Urinalysis, Routine w reflex microscopic  2. Benign prostatic hyperplasia with nocturia ***  3. Nocturia ***   No follow-ups on file.  Belvie Clara, MD  [facility] [place]

## 2024-08-15 ENCOUNTER — Encounter: Payer: Self-pay | Admitting: Urology

## 2024-08-15 NOTE — Patient Instructions (Signed)

## 2024-08-21 ENCOUNTER — Ambulatory Visit: Admitting: Internal Medicine

## 2024-08-24 ENCOUNTER — Other Ambulatory Visit: Payer: Self-pay

## 2024-08-24 ENCOUNTER — Ambulatory Visit (INDEPENDENT_AMBULATORY_CARE_PROVIDER_SITE_OTHER)

## 2024-08-24 DIAGNOSIS — Z23 Encounter for immunization: Secondary | ICD-10-CM

## 2024-08-29 DIAGNOSIS — N390 Urinary tract infection, site not specified: Secondary | ICD-10-CM | POA: Diagnosis not present

## 2024-08-29 DIAGNOSIS — N185 Chronic kidney disease, stage 5: Secondary | ICD-10-CM | POA: Diagnosis not present

## 2024-09-04 DIAGNOSIS — I120 Hypertensive chronic kidney disease with stage 5 chronic kidney disease or end stage renal disease: Secondary | ICD-10-CM | POA: Diagnosis not present

## 2024-09-04 DIAGNOSIS — N185 Chronic kidney disease, stage 5: Secondary | ICD-10-CM | POA: Diagnosis not present

## 2024-09-04 DIAGNOSIS — D631 Anemia in chronic kidney disease: Secondary | ICD-10-CM | POA: Diagnosis not present

## 2024-09-04 DIAGNOSIS — N2581 Secondary hyperparathyroidism of renal origin: Secondary | ICD-10-CM | POA: Diagnosis not present

## 2024-09-18 ENCOUNTER — Ambulatory Visit: Admitting: Internal Medicine

## 2024-09-18 ENCOUNTER — Telehealth: Payer: Self-pay

## 2024-09-18 ENCOUNTER — Encounter: Payer: Self-pay | Admitting: Internal Medicine

## 2024-09-18 VITALS — BP 136/74 | HR 69 | Temp 97.8°F | Ht 65.0 in

## 2024-09-18 DIAGNOSIS — I2583 Coronary atherosclerosis due to lipid rich plaque: Secondary | ICD-10-CM | POA: Diagnosis not present

## 2024-09-18 DIAGNOSIS — I2510 Atherosclerotic heart disease of native coronary artery without angina pectoris: Secondary | ICD-10-CM

## 2024-09-18 DIAGNOSIS — K219 Gastro-esophageal reflux disease without esophagitis: Secondary | ICD-10-CM | POA: Diagnosis not present

## 2024-09-18 DIAGNOSIS — R0982 Postnasal drip: Secondary | ICD-10-CM

## 2024-09-18 DIAGNOSIS — N184 Chronic kidney disease, stage 4 (severe): Secondary | ICD-10-CM | POA: Diagnosis not present

## 2024-09-18 DIAGNOSIS — I10 Essential (primary) hypertension: Secondary | ICD-10-CM

## 2024-09-18 DIAGNOSIS — E785 Hyperlipidemia, unspecified: Secondary | ICD-10-CM

## 2024-09-18 LAB — COMPREHENSIVE METABOLIC PANEL WITH GFR
ALT: 17 U/L (ref 0–53)
AST: 22 U/L (ref 0–37)
Albumin: 4.2 g/dL (ref 3.5–5.2)
Alkaline Phosphatase: 68 U/L (ref 39–117)
BUN: 93 mg/dL (ref 6–23)
CO2: 26 meq/L (ref 19–32)
Calcium: 9.1 mg/dL (ref 8.4–10.5)
Chloride: 101 meq/L (ref 96–112)
Creatinine, Ser: 3.67 mg/dL — ABNORMAL HIGH (ref 0.40–1.50)
GFR: 14.65 mL/min — CL (ref >60.00)
Glucose, Bld: 157 mg/dL — ABNORMAL HIGH (ref 70–99)
Potassium: 4.1 meq/L (ref 3.5–5.1)
Sodium: 136 meq/L (ref 135–145)
Total Bilirubin: 0.4 mg/dL (ref 0.2–1.2)
Total Protein: 7.2 g/dL (ref 6.0–8.3)

## 2024-09-18 LAB — CBC WITH DIFFERENTIAL/PLATELET
Basophils Absolute: 0 K/uL (ref 0.0–0.1)
Basophils Relative: 0.4 % (ref 0.0–3.0)
Eosinophils Absolute: 0.1 K/uL (ref 0.0–0.7)
Eosinophils Relative: 1.5 % (ref 0.0–5.0)
HCT: 36.3 % — ABNORMAL LOW (ref 39.0–52.0)
Hemoglobin: 12 g/dL — ABNORMAL LOW (ref 13.0–17.0)
Lymphocytes Relative: 18.7 % (ref 12.0–46.0)
Lymphs Abs: 1.6 K/uL (ref 0.7–4.0)
MCHC: 33.1 g/dL (ref 30.0–36.0)
MCV: 91.6 fl (ref 78.0–100.0)
Monocytes Absolute: 0.7 K/uL (ref 0.1–1.0)
Monocytes Relative: 8.3 % (ref 3.0–12.0)
Neutro Abs: 6.1 K/uL (ref 1.4–7.7)
Neutrophils Relative %: 71.1 % (ref 43.0–77.0)
Platelets: 163 K/uL (ref 150.0–400.0)
RBC: 3.97 Mil/uL — ABNORMAL LOW (ref 4.22–5.81)
RDW: 13.1 % (ref 11.5–15.5)
WBC: 8.6 K/uL (ref 4.0–10.5)

## 2024-09-18 MED ORDER — FLUTICASONE PROPIONATE 50 MCG/ACT NA SUSP: 2.0000 | Freq: Every day | NASAL | 6 refills | Status: AC

## 2024-09-18 NOTE — Assessment & Plan Note (Signed)
Remote No sx's

## 2024-09-18 NOTE — Progress Notes (Signed)
 Subjective:  Patient ID: MARCKUS HANOVER, male    DOB: 1941-07-02  Age: 83 y.o. MRN: [id number]  CC: Medical Management of Chronic Issues (4 Month follow up)   HPI DEMARIUS ARCHILA presents for HTN, CAD, dyslipidemia  C/o phlegm in the throat x 1 mo, cough - ?post-nasal drip. No GERD  Outpatient Medications Prior to Visit  Medication Sig Dispense Refill   Cholecalciferol  (VITAMIN D ) 2000 UNITS CAPS Take 1 capsule by mouth daily.     cyanocobalamin (VITAMIN B12) 1000 MCG tablet      Glucosamine-Chondroit-Vit C-Mn (GLUCOSAMINE 1500 COMPLEX) CAPS Take by mouth daily at 2 PM.     Omega-3 Fatty Acids (FISH OIL) 1000 MG CAPS 1,200 mg daily at 2 PM.     pravastatin  (PRAVACHOL ) 20 MG tablet Take 1 tablet (20 mg total) by mouth daily. 90 tablet 3   tamsulosin  (FLOMAX ) 0.4 MG CAPS capsule Take 1 capsule (0.4 mg total) by mouth 2 (two) times daily. 180 capsule 3   No facility-administered medications prior to visit.    ROS: Review of Systems  Constitutional:  Negative for appetite change, fatigue and unexpected weight change.  HENT:  Positive for postnasal drip. Negative for congestion, nosebleeds, sneezing, sore throat and trouble swallowing.   Eyes:  Negative for itching and visual disturbance.  Respiratory:  Negative for cough.   Cardiovascular:  Negative for chest pain, palpitations and leg swelling.  Gastrointestinal:  Negative for abdominal distention, blood in stool, diarrhea and nausea.  Genitourinary:  Negative for frequency and hematuria.  Musculoskeletal:  Negative for back pain, gait problem, joint swelling and neck pain.  Skin:  Negative for rash.  Neurological:  Negative for dizziness, tremors, speech difficulty and weakness.  Psychiatric/Behavioral:  Negative for agitation, dysphoric mood and sleep disturbance. The patient is not nervous/anxious.     Objective:  BP 136/74   Pulse 69   Temp 97.8 F (36.6 C)   Ht 5' 5 (1.651 m)   SpO2 99%   BMI 22.60 kg/m   BP Readings  from Last 3 Encounters:  09/18/24 136/74  08/14/24 123/69  07/13/24 (!) 110/58    Wt Readings from Last 3 Encounters:  07/13/24 135 lb 12.8 oz (61.6 kg)  05/17/24 130 lb (59 kg)  04/13/24 133 lb (60.3 kg)    Physical Exam Constitutional:      General: He is not in acute distress.    Appearance: He is well-developed.     Comments: NAD  Eyes:     Conjunctiva/sclera: Conjunctivae normal.     Pupils: Pupils are equal, round, and reactive to light.  Neck:     Thyroid : No thyromegaly.     Vascular: No JVD.  Cardiovascular:     Rate and Rhythm: Normal rate and regular rhythm.     Heart sounds: Normal heart sounds. No murmur heard.    No friction rub. No gallop.  Pulmonary:     Effort: Pulmonary effort is normal. No respiratory distress.     Breath sounds: Normal breath sounds. No wheezing or rales.  Chest:     Chest wall: No tenderness.  Abdominal:     General: Bowel sounds are normal. There is no distension.     Palpations: Abdomen is soft. There is no mass.     Tenderness: There is no abdominal tenderness. There is no guarding or rebound.  Musculoskeletal:        General: No tenderness. Normal range of motion.     Cervical back:  Normal range of motion.  Lymphadenopathy:     Cervical: No cervical adenopathy.  Skin:    General: Skin is warm and dry.     Findings: No rash.  Neurological:     Mental Status: He is alert and oriented to person, place, and time.     Cranial Nerves: No cranial nerve deficit.     Motor: No abnormal muscle tone.     Coordination: Coordination normal.     Gait: Gait normal.     Deep Tendon Reflexes: Reflexes are normal and symmetric.  Psychiatric:        Behavior: Behavior normal.        Thought Content: Thought content normal.        Judgment: Judgment normal.     Lab Results  Component Value Date   WBC 8.9 02/16/2024   HGB 12.0 (L) 02/16/2024   HCT 36.5 (L) 02/16/2024   PLT 180.0 02/16/2024   GLUCOSE 138 (H) 02/16/2024   CHOL 141  02/16/2024   TRIG 62.0 02/16/2024   HDL 57.60 02/16/2024   LDLDIRECT 144.9 11/14/2013   LDLCALC 71 02/16/2024   ALT 14 02/16/2024   AST 19 02/16/2024   NA 137 02/16/2024   K 4.5 02/16/2024   CL 102 02/16/2024   CREATININE 3.85 (H) 02/16/2024   BUN 91 (HH) 02/16/2024   CO2 27 02/16/2024   TSH 0.76 02/16/2024   PSA 11.03 (H) 10/23/2021   INR 1.02 12/09/2015   HGBA1C 5.8 (H) 12/09/2015    MM DIAG BREAST TOMO BILATERAL Result Date: 02/05/2023 CLINICAL DATA:  83 year old male with intermittent tenderness of the left nipple for 1-2 months. EXAM: DIGITAL DIAGNOSTIC BILATERAL MAMMOGRAM WITH TOMOSYNTHESIS TECHNIQUE: Bilateral digital diagnostic mammography and breast tomosynthesis was performed. COMPARISON:  Previous exam(s). ACR Breast Density Category a: The breasts are almost entirely fatty. FINDINGS: [place] shaped fibroglandular tissue is seen originating from the left nipple and extending posteriorly. This is consistent with mild left-sided gynecomastia. Otherwise, no suspicious findings in either breast. IMPRESSION: 1. Mild left-sided gynecomastia counting for the patient's clinical symptoms. 2. No mammographic evidence of malignancy in either breast. RECOMMENDATION: I discussed with the patient the fact that gynecomastia can occur in older men as testosterone levels decrease with age or in younger men with low testosterone levels, causing a change in the serum testosterone:estrogen ratio. We also discussed other potential etiologies of gynecomastia including numerous prescription medications. We also discussed the possibility of surgical excision if symptoms continue and if an etiology of the gynecomastia cannot be determined and therefore corrected. I have discussed the findings and recommendations with the patient. If applicable, a reminder letter will be sent to the patient regarding the next appointment. BI-RADS CATEGORY  2: Benign. Electronically Signed   By: Serena  Chacko M.D.   On:  02/05/2023 13:27   Assessment & Plan:   Problem List Items Addressed This Visit     CAD (coronary artery disease)   Cont on Pravastatin   F/u w/Dr Sheppard Sierras      Relevant Orders   CBC with Differential/Platelet   Comprehensive metabolic panel with GFR   Chronic kidney disease   F/u w/Dr Calodonato q 6 wks. GFR 16 On Flomax  for BPH      Dyslipidemia   Cont with Pravastatin        Essential hypertension   On NAS      GERD (gastroesophageal reflux disease)   Remote  No sx's      Postnasal drip - Primary  C/o phlegm in the throat x 1 mo, cough - ?post-nasal drip. No GERD Will try  Flonase           Meds ordered this encounter  Medications   fluticasone  (FLONASE ) 50 MCG/ACT nasal spray    Sig: Place 2 sprays into both nostrils daily.    Dispense:  16 g    Refill:  6      Follow-up: No follow-ups on file.  Marolyn Noel, MD

## 2024-09-18 NOTE — Assessment & Plan Note (Signed)
Cont on Pravastatin  F/u w/Dr Simona Huh

## 2024-09-18 NOTE — Assessment & Plan Note (Addendum)
 C/o phlegm in the throat x 1 mo, cough - ?post-nasal drip. No GERD Will try  Flonase 

## 2024-09-18 NOTE — Assessment & Plan Note (Signed)
Cont with Pravastatin

## 2024-09-18 NOTE — Assessment & Plan Note (Signed)
On NAS

## 2024-09-18 NOTE — Telephone Encounter (Signed)
 CRITICAL VALUE STICKER  CRITICAL VALUE: BUN 93, GFR 14.65  RECEIVER (on-site recipient of call): Rebeca Valdivia  DATE & TIME NOTIFIED: 09/18/2024 @ 4:14 PM  MESSENGER (representative from lab): HOPE  MD NOTIFIED: Yes  TIME OF NOTIFICATION: 4:14 PM  Pt is currently being Tx'd for Kidney disease.

## 2024-09-18 NOTE — Assessment & Plan Note (Signed)
 F/u w/Dr Calodonato q 6 wks. GFR 16 On Flomax  for BPH

## 2024-09-20 ENCOUNTER — Ambulatory Visit: Payer: Self-pay | Admitting: Internal Medicine

## 2024-10-16 DIAGNOSIS — N2581 Secondary hyperparathyroidism of renal origin: Secondary | ICD-10-CM | POA: Diagnosis not present

## 2024-10-16 DIAGNOSIS — D631 Anemia in chronic kidney disease: Secondary | ICD-10-CM | POA: Diagnosis not present

## 2024-10-16 DIAGNOSIS — N185 Chronic kidney disease, stage 5: Secondary | ICD-10-CM | POA: Diagnosis not present

## 2024-10-16 DIAGNOSIS — E785 Hyperlipidemia, unspecified: Secondary | ICD-10-CM | POA: Diagnosis not present

## 2024-10-16 DIAGNOSIS — I120 Hypertensive chronic kidney disease with stage 5 chronic kidney disease or end stage renal disease: Secondary | ICD-10-CM | POA: Diagnosis not present

## 2024-11-20 ENCOUNTER — Ambulatory Visit
Admission: EM | Admit: 2024-11-20 | Discharge: 2024-11-20 | Disposition: A | Discharge status: Home / Self Care | Attending: Nurse Practitioner | Admitting: Nurse Practitioner

## 2024-11-20 DIAGNOSIS — R490 Dysphonia: Secondary | ICD-10-CM

## 2024-11-20 DIAGNOSIS — J069 Acute upper respiratory infection, unspecified: Secondary | ICD-10-CM | POA: Diagnosis not present

## 2024-11-20 MED ORDER — CETIRIZINE HCL 10 MG PO TABS: 10.0000 mg | ORAL_TABLET | Freq: Every day | ORAL | 0 refills | Status: AC

## 2024-11-20 MED ORDER — GUAIFENESIN 100 MG/5ML PO LIQD: 10.0000 mL | Freq: Four times a day (QID) | ORAL | 0 refills | Status: AC | PRN

## 2024-11-20 NOTE — ED Triage Notes (Signed)
 Pt reports cough, congestion, hoarseness x 5 days. Has tried OTC meds has found no relief.  Pt has declined viral testing during triage.

## 2024-11-20 NOTE — ED Provider Notes (Signed)
 " [facility]    CSN: [id number] Arrival date & time: 11/20/24  1208      History   Chief Complaint No chief complaint on file.   HPI Sergio Hughes is a 83 y.o. male.   The history is provided by the patient.   Patient presents with a 5-day history of cough, nasal congestion, and hoarseness.  He denies fever, chills, headache, ear pain, ear drainage, wheezing, difficulty breathing, abdominal pain, nausea, vomiting, diarrhea, or rash.  Patient states he has tried over-the-counter Coricidin with minimal relief of his symptoms.  States that he was around his granddaughter who was sick prior to his symptoms starting.  Past Medical History:  Diagnosis Date   Bilateral carotid bruits    us  carotid bilateral 05-14-2022 epic   Cataract 2020   Removed   Chronic kidney disease stage 4    Colon polyps    Coronary artery calcification seen on CT scan    Hyperlipidemia    Hypertension    per pt on 08-25-2022 off amlodipine  last 3 to 4 months per nephrology dr leeanne   Osteoarthritis    Prostate cancer [facility])    RBBB     Patient Active Problem List   Diagnosis Date Noted   Postnasal drip 09/18/2024   Bladder neck obstruction 02/16/2024   SI (sacroiliac) pain 07/14/2023   Wart of face [B07.9] 03/17/2023   Gynecomastia 12/16/2022   Dysuria 09/14/2022   Prostate cancer (HCC) 05/28/2022   Tinnitus 04/24/2022   Neck pain on right side 04/24/2022   Bilateral carotid bruits 04/24/2022   GERD (gastroesophageal reflux disease) 04/24/2022   Hand cramps 02/17/2021   Skin lesion of left ear 09/09/2020   Foamy urine 12/19/2019   CAD (coronary artery disease) 06/21/2019   Chronic kidney disease 08/10/2018   Chest pain 06/07/2017   Headache 06/07/2017   Abdominal pain, chronic, right lower quadrant 06/05/2016   Villous adenoma of right colon 12/16/2015   Colon polyps 05/22/2015   Actinic keratoses 12/09/2014   Well adult exam 11/11/2012   Erectile dysfunction 02/19/2011    Dizziness 02/19/2011   Neoplasm of uncertain behavior of skin 08/21/2010   Osteoarthritis 02/18/2010   TOBACCO USE, QUIT 02/18/2010   HAND PAIN, RIGHT 02/05/2009   Dyslipidemia 08/09/2008   Essential hypertension 08/09/2008    Past Surgical History:  Procedure Laterality Date   APPENDECTOMY  2017   In conjunction with partial colectomy   COLON SURGERY     COLONOSCOPY     CYSTOSCOPY N/A 09/03/2022   Procedure: CYSTOSCOPY;  Surgeon: Sherrilee Belvie CROME, MD;  Location: [facility];  Service: Urology;  Laterality: N/A;   EYE SURGERY  2020   Cataracts removed and corneal implant   INGUINAL HERNIA REPAIR  2005   Right   LESION REMOVAL Left 09/23/2020   Procedure: MINOR EXICISION OF LESION OF LEFT EAR;  Surgeon: Jesus Oliphant, MD;  Location: [facility];  Service: ENT;  Laterality: Left;   PARTIAL COLECTOMY Right 12/16/2015   Procedure: PARTIAL CECECTOMY AND APPENDECTOMY;  Surgeon: Lynwood Pina, MD;  Location: [facility];  Service: General;  Laterality: Right;   POLYPECTOMY     RADIOACTIVE SEED IMPLANT N/A 09/03/2022   Procedure: RADIOACTIVE SEED IMPLANT/BRACHYTHERAPY IMPLANT;  Surgeon: Sherrilee Belvie CROME, MD;  Location: [facility];  Service: Urology;  Laterality: N/A;   SPACE OAR INSTILLATION N/A 09/03/2022   Procedure: SPACE OAR INSTILLATION;  Surgeon: Sherrilee Belvie CROME, MD;  Location: [facility] [place];  Service: Urology;  Laterality: N/A;   VASECTOMY         Home Medications    Prior to Admission medications  Medication Sig Start Date End Date Taking? Authorizing Provider  cetirizine  (ZYRTEC ) 10 MG tablet Take 1 tablet (10 mg total) by mouth daily. 11/20/24  Yes Leath-Warren, Etta PARAS, NP  guaiFENesin (ROBITUSSIN) 100 MG/5ML liquid Take 10 mLs by mouth every 6 (six) hours as needed for cough or to loosen phlegm. 11/20/24  Yes Leath-Warren, Etta PARAS, NP  Cholecalciferol  (VITAMIN D ) 2000 UNITS CAPS Take 1 capsule by mouth  daily.    [provider]  cyanocobalamin (VITAMIN B12) 1000 MCG tablet     [provider]  fluticasone  (FLONASE ) 50 MCG/ACT nasal spray Place 2 sprays into both nostrils daily. 09/18/24   Plotnikov, Aleksei V, MD  Glucosamine-Chondroit-Vit C-Mn (GLUCOSAMINE 1500 COMPLEX) CAPS Take by mouth daily at 2 PM.    [provider]  Omega-3 Fatty Acids (FISH OIL) 1000 MG CAPS 1,200 mg daily at 2 PM.    [provider]  pravastatin  (PRAVACHOL ) 20 MG tablet Take 1 tablet (20 mg total) by mouth daily. 03/27/24   Plotnikov, Karlynn GAILS, MD  tamsulosin  (FLOMAX ) 0.4 MG CAPS capsule Take 1 capsule (0.4 mg total) by mouth 2 (two) times daily. 08/14/24   McKenzie, Belvie CROME, MD    Family History Family History  Problem Relation Age of Onset   Diverticulitis Mother    Hypertension Father    Cancer Father        brain ca   Kidney disease Brother 54   Colon cancer Brother    Rectal cancer Neg Hx    Stomach cancer Neg Hx    Esophageal cancer Neg Hx     Social History Social History[1]   Allergies   Bupropion hcl, Lovastatin, and Uroxatral  [alfuzosin ]   Review of Systems Review of Systems Per HPI  Physical Exam Triage Vital Signs ED Triage Vitals [11/20/24 1258]  Encounter Vitals Group     BP 106/63     Girls Systolic BP Percentile      Girls Diastolic BP Percentile      Boys Systolic BP Percentile      Boys Diastolic BP Percentile      Pulse Rate 82     Resp 20     Temp 98.7 F (37.1 C)     Temp Source Oral     SpO2 94 %     Weight      Height      Head Circumference      Peak Flow      Pain Score 0     Pain Loc      Pain Education      Exclude from Growth Chart    No data found.  Updated Vital Signs BP 106/63 (BP Location: Right Arm)   Pulse 82   Temp 98.7 F (37.1 C) (Oral)   Resp 20   SpO2 94%   Visual Acuity Right Eye Distance:   Left Eye Distance:   Bilateral Distance:    Right Eye Near:   Left Eye Near:    Bilateral Near:      Physical Exam Vitals and nursing note reviewed.  Constitutional:      General: He is not in acute distress.    Appearance: Normal appearance. He is well-developed.  HENT:     Head: Normocephalic and atraumatic.     Right Ear: Tympanic membrane,  ear canal and external ear normal.     Left Ear: Tympanic membrane, ear canal and external ear normal.     Nose: Congestion present.     Right Turbinates: Enlarged and swollen.     Left Turbinates: Enlarged and swollen.     Right Sinus: No maxillary sinus tenderness or frontal sinus tenderness.     Left Sinus: No maxillary sinus tenderness or frontal sinus tenderness.     Mouth/Throat:     Lips: Pink.     Mouth: Mucous membranes are moist.     Pharynx: Uvula midline. Posterior oropharyngeal erythema and postnasal drip present. No pharyngeal swelling, oropharyngeal exudate or uvula swelling.     Comments: Cobblestoning present to posterior oropharynx  Eyes:     Extraocular Movements: Extraocular movements intact.     Conjunctiva/sclera: Conjunctivae normal.     Pupils: Pupils are equal, round, and reactive to light.  Neck:     Thyroid : No thyromegaly.     Trachea: No tracheal deviation.  Cardiovascular:     Rate and Rhythm: Normal rate and regular rhythm.     Pulses: Normal pulses.     Heart sounds: Normal heart sounds.  Pulmonary:     Effort: Pulmonary effort is normal. No respiratory distress.     Breath sounds: Normal breath sounds. No stridor. No wheezing, rhonchi or rales.  Abdominal:     General: Bowel sounds are normal.     Palpations: Abdomen is soft.     Tenderness: There is no abdominal tenderness.  Musculoskeletal:     Cervical back: Normal range of motion and neck supple.  Skin:    General: Skin is warm and dry.  Neurological:     General: No focal deficit present.     Mental Status: He is alert and oriented to person, place, and time.  Psychiatric:        Mood and Affect: Mood normal.        Behavior: Behavior  normal.        Thought Content: Thought content normal.        Judgment: Judgment normal.      UC Treatments / Results  Labs (all labs ordered are listed, but only abnormal results are displayed) Labs Reviewed - No data to display  EKG   Radiology No results found.  Procedures Procedures (including critical care time)  Medications Ordered in UC Medications - No data to display  Initial Impression / Assessment and Plan / UC Course  I have reviewed the triage vital signs and the nursing notes.  Pertinent labs & imaging results that were available during my care of the patient were reviewed by me and considered in my medical decision making (see chart for details).  Patient presents for complaints of cough, hoarseness, and nasal congestion has been present for the past 5 days.  On exam, patient's lung sounds are clear throughout, room air sats are at 94%.  There is no wheezing, rales, or rhonchi to suggest pneumonia, symptoms consistent with viral etiology.  Will provide symptomatic treatment with guaifenesin 100 mg and cetirizine  10 mg for postnasal drainage.  Patient advised to continue use of Flonase  daily.  Supportive care recommendations were provided and discussed with the patient to include fluids, rest, over-the-counter analgesics, and use of a humidifier during sleep.  Discussed indications with the patient regarding follow-up.  Patient was in agreement with this plan of care and verbalizes understanding.  All questions were answered.  Patient stable for discharge.  Final Clinical Impressions(s) / UC Diagnoses   Final diagnoses:  Viral URI with cough  Hoarseness     Discharge Instructions      Take medications as prescribed.  Continue use of Flonase  daily as prescribed. Increase fluids and allow for plenty of rest. You may take over-the-counter Tylenol  as needed for pain, fever, or general discomfort. Recommend use of a humidifier in your bedroom at nighttime during  sleep and sleeping elevated on pillows while symptoms persist. Your symptoms should begin to improve over the next 5 to 7 days.  If your symptoms fail to improve, or begin to worsen, you may follow-up in this clinic or with your primary care physician for further evaluation. Follow-up as needed.     ED Prescriptions     Medication Sig Dispense Auth. Provider   guaiFENesin (ROBITUSSIN) 100 MG/5ML liquid Take 10 mLs by mouth every 6 (six) hours as needed for cough or to loosen phlegm. 300 mL Leath-Warren, Etta PARAS, NP   cetirizine  (ZYRTEC ) 10 MG tablet Take 1 tablet (10 mg total) by mouth daily. 30 tablet Leath-Warren, Etta PARAS, NP      PDMP not reviewed this encounter.     [1]  Social History Tobacco Use   Smoking status: Former    Current packs/day: 0.00    Average packs/day: 0.5 packs/day for 10.0 years (5.0 ttl pk-yrs)    Types: Cigarettes    Start date: 12/15/1955    Quit date: 12/14/1965    Years since quitting: 58.9   Smokeless tobacco: Never  Vaping Use   Vaping status: Never Used  Substance Use Topics   Alcohol use: Not Currently   Drug use: No     Gilmer Etta PARAS, NP 11/20/24 1340  "

## 2024-11-20 NOTE — Discharge Instructions (Addendum)
 Take medications as prescribed.  Continue use of Flonase  daily as prescribed. Increase fluids and allow for plenty of rest. You may take over-the-counter Tylenol  as needed for pain, fever, or general discomfort. Recommend use of a humidifier in your bedroom at nighttime during sleep and sleeping elevated on pillows while symptoms persist. Your symptoms should begin to improve over the next 5 to 7 days.  If your symptoms fail to improve, or begin to worsen, you may follow-up in this clinic or with your primary care physician for further evaluation. Follow-up as needed.

## 2024-12-10 ENCOUNTER — Ambulatory Visit: Payer: Self-pay

## 2024-12-10 ENCOUNTER — Encounter (HOSPITAL_COMMUNITY): Payer: Self-pay | Admitting: Emergency Medicine

## 2024-12-10 ENCOUNTER — Emergency Department (HOSPITAL_COMMUNITY)

## 2024-12-10 ENCOUNTER — Other Ambulatory Visit: Payer: Self-pay

## 2024-12-10 ENCOUNTER — Emergency Department (HOSPITAL_COMMUNITY)
Admission: EM | Admit: 2024-12-10 | Discharge: 2024-12-10 | Disposition: A | Discharge status: Home / Self Care | Source: Ambulatory Visit

## 2024-12-10 DIAGNOSIS — Y92000 Kitchen of unspecified non-institutional (private) residence as  the place of occurrence of the external cause: Secondary | ICD-10-CM | POA: Diagnosis not present

## 2024-12-10 DIAGNOSIS — S0181XA Laceration without foreign body of other part of head, initial encounter: Secondary | ICD-10-CM

## 2024-12-10 DIAGNOSIS — W01198A Fall on same level from slipping, tripping and stumbling with subsequent striking against other object, initial encounter: Secondary | ICD-10-CM | POA: Insufficient documentation

## 2024-12-10 DIAGNOSIS — S01111A Laceration without foreign body of right eyelid and periocular area, initial encounter: Secondary | ICD-10-CM | POA: Insufficient documentation

## 2024-12-10 DIAGNOSIS — W19XXXA Unspecified fall, initial encounter: Secondary | ICD-10-CM

## 2024-12-10 DIAGNOSIS — S0990XA Unspecified injury of head, initial encounter: Secondary | ICD-10-CM | POA: Diagnosis present

## 2024-12-10 DIAGNOSIS — Z79899 Other long term (current) drug therapy: Secondary | ICD-10-CM | POA: Insufficient documentation

## 2024-12-10 LAB — URINALYSIS, ROUTINE W REFLEX MICROSCOPIC
Bacteria, UA: NONE SEEN
Bilirubin Urine: NEGATIVE
Glucose, UA: NEGATIVE mg/dL
Ketones, ur: NEGATIVE mg/dL
Leukocytes,Ua: NEGATIVE
Nitrite: NEGATIVE
Protein, ur: NEGATIVE mg/dL
Specific Gravity, Urine: 1.014 (ref 1.005–1.030)
pH: 5 (ref 5.0–8.0)

## 2024-12-10 LAB — CBC
HCT: 34.8 % — ABNORMAL LOW (ref 39.0–52.0)
Hemoglobin: 11.5 g/dL — ABNORMAL LOW (ref 13.0–17.0)
MCH: 31 pg (ref 26.0–34.0)
MCHC: 33 g/dL (ref 30.0–36.0)
MCV: 93.8 fL (ref 80.0–100.0)
Platelets: 187 K/uL (ref 150–400)
RBC: 3.71 MIL/uL — ABNORMAL LOW (ref 4.22–5.81)
RDW: 12.8 % (ref 11.5–15.5)
WBC: 9.3 K/uL (ref 4.0–10.5)
nRBC: 0 % (ref 0.0–0.2)

## 2024-12-10 LAB — COMPREHENSIVE METABOLIC PANEL WITH GFR
ALT: 15 U/L (ref 0–44)
AST: 24 U/L (ref 15–41)
Albumin: 3.8 g/dL (ref 3.5–5.0)
Alkaline Phosphatase: 74 U/L (ref 38–126)
Anion gap: 13 (ref 5–15)
BUN: 81 mg/dL — ABNORMAL HIGH (ref 8–23)
CO2: 24 mmol/L (ref 22–32)
Calcium: 8.9 mg/dL (ref 8.9–10.3)
Chloride: 102 mmol/L (ref 98–111)
Creatinine, Ser: 3.2 mg/dL — ABNORMAL HIGH (ref 0.61–1.24)
GFR, Estimated: 18 mL/min — ABNORMAL LOW
Glucose, Bld: 129 mg/dL — ABNORMAL HIGH (ref 70–99)
Potassium: 3.8 mmol/L (ref 3.5–5.1)
Sodium: 139 mmol/L (ref 135–145)
Total Bilirubin: 0.3 mg/dL (ref 0.0–1.2)
Total Protein: 6.8 g/dL (ref 6.5–8.1)

## 2024-12-10 LAB — CBG MONITORING, ED: Glucose-Capillary: 133 mg/dL — ABNORMAL HIGH (ref 70–99)

## 2024-12-10 NOTE — ED Triage Notes (Signed)
 Pov c/o fall in their kitchen this morning. Pt states they collapsed and unsure about LOC. Pt not on blood thinners. Pt states they hit head on marble countertop. Pt endores a 1 lac at top of nose bridge/in between eyebrows. Bleeding controlled. Pt ambulatory

## 2024-12-10 NOTE — ED Provider Notes (Signed)
 " [place] EMERGENCY DEPARTMENT AT [facility] Provider Note   CSN: [id number] Arrival date & time: 12/10/24  1102     Patient presents with: Sergio Hughes is a 84 y.o. male.   84 year old male presents for evaluation of fall.  States he went to the bathroom, turned around and lost his balance and fell hitting his head on marble.  He states he did not lose consciousness.  He states he is otherwise feeling well.  He denies any other symptoms or concerns at this time.   Fall Associated symptoms include headaches. Pertinent negatives include no chest pain, no abdominal pain and no shortness of breath.       Prior to Admission medications  Medication Sig Start Date End Date Taking? Authorizing Provider  cetirizine  (ZYRTEC ) 10 MG tablet Take 1 tablet (10 mg total) by mouth daily. 11/20/24   Leath-Warren, Etta PARAS, NP  Cholecalciferol  (VITAMIN D ) 2000 UNITS CAPS Take 1 capsule by mouth daily.    [provider]  cyanocobalamin (VITAMIN B12) 1000 MCG tablet     [provider]  fluticasone  (FLONASE ) 50 MCG/ACT nasal spray Place 2 sprays into both nostrils daily. 09/18/24   Plotnikov, Aleksei V, MD  Glucosamine-Chondroit-Vit C-Mn (GLUCOSAMINE 1500 COMPLEX) CAPS Take by mouth daily at 2 PM.    [provider]  guaiFENesin  (ROBITUSSIN) 100 MG/5ML liquid Take 10 mLs by mouth every 6 (six) hours as needed for cough or to loosen phlegm. 11/20/24   Leath-Warren, Etta PARAS, NP  Omega-3 Fatty Acids (FISH OIL) 1000 MG CAPS 1,200 mg daily at 2 PM.    [provider]  pravastatin  (PRAVACHOL ) 20 MG tablet Take 1 tablet (20 mg total) by mouth daily. 03/27/24   Plotnikov, Karlynn GAILS, MD  tamsulosin  (FLOMAX ) 0.4 MG CAPS capsule Take 1 capsule (0.4 mg total) by mouth 2 (two) times daily. 08/14/24   McKenzie, Belvie CROME, MD    Allergies: Bupropion hcl, Lovastatin, and Uroxatral  [alfuzosin ]    Review of Systems  Constitutional:  Negative for chills and  fever.  HENT:  Negative for ear pain and sore throat.   Eyes:  Negative for pain and visual disturbance.  Respiratory:  Negative for cough and shortness of breath.   Cardiovascular:  Negative for chest pain and palpitations.  Gastrointestinal:  Negative for abdominal pain and vomiting.  Genitourinary:  Negative for dysuria and hematuria.  Musculoskeletal:  Negative for arthralgias and back pain.  Skin:  Negative for color change and rash.  Neurological:  Positive for headaches. Negative for seizures and syncope.  All other systems reviewed and are negative.   Updated Vital Signs BP 116/63   Pulse 68   Temp 97.8 F (36.6 C) (Oral)   Resp 15   Ht 5' 5 (1.651 m)   Wt 60.3 kg   SpO2 99%   BMI 22.13 kg/m   Physical Exam Vitals and nursing note reviewed.  Constitutional:      General: He is not in acute distress.    Appearance: Normal appearance. He is well-developed. He is not ill-appearing.  HENT:     Head: Normocephalic.     Comments: 3 cm laceration to right eyebrow, muscles intact, bleeding controlled Eyes:     Conjunctiva/sclera: Conjunctivae normal.  Cardiovascular:     Rate and Rhythm: Normal rate and regular rhythm.     Heart sounds: No murmur heard. Pulmonary:     Effort: Pulmonary effort is normal. No respiratory distress.  Breath sounds: Normal breath sounds.  Abdominal:     Palpations: Abdomen is soft.     Tenderness: There is no abdominal tenderness.  Musculoskeletal:        General: No swelling.     Cervical back: Neck supple.  Skin:    General: Skin is warm and dry.     Capillary Refill: Capillary refill takes less than 2 seconds.  Neurological:     General: No focal deficit present.     Mental Status: He is alert.  Psychiatric:        Mood and Affect: Mood normal.     (all labs ordered are listed, but only abnormal results are displayed) Labs Reviewed  COMPREHENSIVE METABOLIC PANEL WITH GFR - Abnormal; Notable for the following components:       Result Value   Glucose, Bld 129 (*)    BUN 81 (*)    Creatinine, Ser 3.20 (*)    GFR, Estimated 18 (*)    All other components within normal limits  CBC - Abnormal; Notable for the following components:   RBC 3.71 (*)    Hemoglobin 11.5 (*)    HCT 34.8 (*)    All other components within normal limits  URINALYSIS, ROUTINE W REFLEX MICROSCOPIC - Abnormal; Notable for the following components:   Hgb urine dipstick SMALL (*)    All other components within normal limits  CBG MONITORING, ED - Abnormal; Notable for the following components:   Glucose-Capillary 133 (*)    All other components within normal limits    EKG: EKG Interpretation Date/Time:  Sunday December 10 2024 11:14:54 EST Ventricular Rate:  73 PR Interval:  172 QRS Duration:  154 QT Interval:  414 QTC Calculation: 457 R Axis:   181  Text Interpretation: Sinus rhythm Right bundle branch block Compared with prior EKG from 07/13/2024 Confirmed by Gennaro Bouchard ([id number]) on 12/10/2024 11:49:07 AM  Radiology: CT Cervical Spine Wo Contrast Result Date: 12/10/2024 EXAM: CT CERVICAL SPINE WITHOUT CONTRAST 12/10/2024 12:00:43 PM TECHNIQUE: CT of the cervical spine was performed without the administration of intravenous contrast. Multiplanar reformatted images are provided for review. Automated exposure control, iterative reconstruction, and/or weight based adjustment of the mA/kV was utilized to reduce the radiation dose to as low as reasonably achievable. COMPARISON: None available. CLINICAL HISTORY: fall, hit head neck pain FINDINGS: BONES AND ALIGNMENT: The cervical vertebral bodies are fused C2-C7. This appears congenital. Incomplete fusion of the anterior arch of C1 is noted. Significant levoconvex curvature is present in the upper thoracic spine. A butterfly vertebral body is present at T2-T3. No acute fracture or traumatic malalignment. DEGENERATIVE CHANGES: No significant degenerative changes. SOFT TISSUES: No prevertebral  soft tissue swelling. IMPRESSION: 1. No acute findings. 2. 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 3. Incomplete fusion of the anterior arch of C1. 4. 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 5. Significant levoconvex curvature in the upper thoracic spine. Electronically signed by: Lonni Necessary MD 12/10/2024 12:08 PM EST RP Workstation: [id number]   CT Head Wo Contrast Result Date: 12/10/2024 EXAM: CT HEAD WITHOUT CONTRAST 12/10/2024 12:00:43 PM TECHNIQUE: CT of the head was performed without the administration of intravenous contrast. Automated exposure control, iterative reconstruction, and/or weight based adjustment of the mA/kV was utilized to reduce the radiation dose to as low as reasonably achievable. COMPARISON: None available. CLINICAL HISTORY: Fall, hit head. Syncopal episode today. FINDINGS: BRAIN AND VENTRICLES: No acute hemorrhage. No evidence of acute infarct. No hydrocephalus. No extra-axial collection. No  mass effect or midline shift. Moderate generalized atrophy and white matter disease is present. ORBITS: Bilateral lens replacements are noted. The globes and orbits are otherwise within normal limits. SINUSES: No acute abnormality. SOFT TISSUES AND SKULL: Laceration over the nose. Soft tissue swelling is present at the glabella. No underlying fracture or foreign body is present. No skull fracture. IMPRESSION: 1. No acute intracranial abnormality. 2. Soft tissue swelling at the glabella without underlying fracture or foreign body. Electronically signed by: Lonni Necessary MD 12/10/2024 12:06 PM EST RP Workstation: [id number]     .Laceration Repair  Date/Time: 12/10/2024 1:30 PM  Performed by: Gennaro Duwaine CROME, DO Authorized by: Gennaro Duwaine CROME, DO   Consent:    Consent obtained:  Verbal   Consent given by:  Patient   Risks, benefits, and alternatives were discussed: yes     Risks discussed:  Infection and pain   Alternatives discussed:  No  treatment Laceration details:    Location:  Face   Face location:  Forehead   Length (cm):  3   Depth (mm):  3 Pre-procedure details:    Preparation:  Patient was prepped and draped in usual sterile fashion and imaging obtained to evaluate for foreign bodies Treatment:    Area cleansed with:  Saline and soap and water    Amount of cleaning:  Standard   Irrigation method:  Syringe and tap Skin repair:    Repair method:  Tissue adhesive Approximation:    Approximation:  Close Repair type:    Repair type:  Simple Post-procedure details:    Dressing:  Antibiotic ointment and non-adherent dressing   Procedure completion:  Tolerated well, no immediate complications    Medications Ordered in the ED - No data to display                                  Medical Decision Making Cardiac monitor interpretation: Sinus rhythm, no ectopy  Patient here for fall.  Imaging negative.  She did not lose conscious.  Lab work and urine are unremarkable.  Laceration repaired as above.  He Toller procedure well.  Advised Tylenol  as needed for pain and close follow-up with primary care doctor.  He feels comfortable with the plan to be discharged home.  Was given wound care instructions.  Problems Addressed: Fall, initial encounter: acute illness or injury Laceration of forehead, initial encounter: acute illness or injury  Amount and/or Complexity of Data Reviewed External Data Reviewed: notes.    Details: Prior urgent care records reviewed and patient was seen 11-20-2024 for cough/viral URI Labs: ordered. Decision-making details documented in ED Course.    Details: Ordered and reviewed by me and unremarkable Radiology: ordered and independent interpretation performed. Decision-making details documented in ED Course.    Details: Ordered and interpreted by me independently of radiology CT head: Shows no acute abnormality CT C-spine: Shows no acute abnormality ECG/medicine tests: ordered and  independent interpretation performed. Decision-making details documented in ED Course.    Details: Ordered and interpreted by me in the absence of cardiology and shows sinus rhythm, no STEMI, or significant change when compared to prior EKG  Risk OTC drugs. Prescription drug management.     Final diagnoses:  Laceration of forehead, initial encounter  Fall, initial encounter    ED Discharge Orders     None          Gennaro Duwaine CROME, DO 12/10/24 1525  "

## 2024-12-10 NOTE — Discharge Instructions (Addendum)
 You can use Tylenol  as needed for pain.  The glue will come off by itself.  Do not scrub it.  You can shower like normal.  Keep the wound covered.  Follow-up with your primary care doctor as needed.

## 2024-12-19 ENCOUNTER — Ambulatory Visit

## 2024-12-19 VITALS — Ht 65.0 in | Wt 133.0 lb

## 2024-12-19 DIAGNOSIS — Z0000 Encounter for general adult medical examination without abnormal findings: Secondary | ICD-10-CM

## 2024-12-19 NOTE — Patient Instructions (Addendum)
 Sergio Hughes,  Thank you for taking the time for your Medicare Wellness Visit. I appreciate your continued commitment to your health goals. Please review the care plan we discussed, and feel free to reach out if I can assist you further.  Please note that Annual Wellness Visits do not include a physical exam. Some assessments may be limited, especially if the visit was conducted virtually. If needed, we may recommend an in-person follow-up with your provider.  Ongoing Care Seeing your primary care provider every 3 to 6 months helps us  monitor your health and provide consistent, personalized care. Next office visit on 12/20/2024.  Keep up the good work.  Referrals If a referral was made during today's visit and you haven't received any updates within two weeks, please contact the referred provider directly to check on the status.  Recommended Screenings:  Health Maintenance  Topic Date Due   COVID-19 Vaccine (6 - 2025-26 season) 07/24/2024   Medicare Annual Wellness Visit  12/26/2024   DTaP/Tdap/Td vaccine (3 - Td or Tdap) 02/18/2031   Pneumococcal Vaccine for age over 47  Completed   Flu Shot  Completed   Meningitis B Vaccine  Aged Out   Breast Cancer Screening  Discontinued   Colon Cancer Screening  Discontinued   Zoster (Shingles) Vaccine  Discontinued       12/19/2024    3:03 PM  Advanced Directives  Does Patient Have a Medical Advance Directive? Yes  Type of Estate Agent of [place];Living will  Copy of Healthcare Power of Attorney in Chart? No - copy requested    Vision: Annual vision screenings are recommended for early detection of glaucoma, cataracts, and diabetic retinopathy. These exams can also reveal signs of chronic conditions such as diabetes and high blood pressure.  Dental: Annual dental screenings help detect early signs of oral cancer, gum disease, and other conditions linked to overall health, including heart disease and diabetes.  Please see  the attached documents for additional preventive care recommendations.

## 2024-12-19 NOTE — Progress Notes (Unsigned)
 "  Chief Complaint  Patient presents with   Medicare Wellness     Subjective:   Sergio Hughes is a 84 y.o. male who presents for a Medicare Annual Wellness Visit.  Visit info / Clinical Intake: Medicare Wellness Visit Type:: Subsequent Annual Wellness Visit Persons participating in visit and providing information:: patient Medicare Wellness Visit Mode:: Telephone If telephone:: video declined Since this visit was completed virtually, some vitals may be partially provided or unavailable. Missing vitals are due to the limitations of the virtual format.: Unable to obtain vitals - no equipment If Telephone or Video please confirm:: I connected with patient using audio/video enable telemedicine. I verified patient identity with two identifiers, discussed telehealth limitations, and patient agreed to proceed. Patient Location:: Home Provider Location:: Home Interpreter Needed?: No Pre-visit prep was completed: yes AWV questionnaire completed by patient prior to visit?: no Living arrangements:: lives with spouse/significant other Patient's Overall Health Status Rating: good Typical amount of pain: none Does pain affect daily life?: no Are you currently prescribed opioids?: no  Dietary Habits and Nutritional Risks How many meals a day?: 2 Eats fruit and vegetables daily?: yes Most meals are obtained by: preparing own meals In the last 2 weeks, have you had any of the following?: none Diabetic:: no  Functional Status Activities of Daily Living (to include ambulation/medication): Independent Ambulation: Independent with device- listed below Home Assistive Devices/Equipment: Other (Comment) (has implants) Medication Administration: Independent Home Management (perform basic housework or laundry): Independent Manage your own finances?: yes Primary transportation is: driving Concerns about vision?: no *vision screening is required for WTM* Concerns about hearing?: no  Fall  Screening Falls in the past year?: 1 Number of falls in past year: 0 (hit his head on bathroom sink) Was there an injury with Fall?: 1 Fall Risk Category Calculator: 2 Patient Fall Risk Level: Moderate Fall Risk  Fall Risk Patient at Risk for Falls Due to: Impaired balance/gait Fall risk Follow up: Falls evaluation completed; Falls prevention discussed  Home and Transportation Safety: All rugs have non-skid backing?: yes All stairs or steps have railings?: yes Grab bars in the bathtub or shower?: yes Have non-skid surface in bathtub or shower?: yes Good home lighting?: yes Regular seat belt use?: yes Hospital stays in the last year:: no  Cognitive Assessment Difficulty concentrating, remembering, or making decisions? : no Will 6CIT or Mini Cog be Completed: no 6CIT or Mini Cog Declined: patient alert, oriented, able to answer questions appropriately and recall recent events What year is it?: 0 points What month is it?: 0 points Give patient an address phrase to remember (5 components): [address], [place] what time is it?: 0 points Count backwards from 20 to 1: 0 points Say the months of the year in reverse: 0 points Repeat the address phrase from earlier: 0 points 6 CIT Score: 0 points  Advance Directives (For Healthcare) Does Patient Have a Medical Advance Directive?: Yes Type of Advance Directive: Healthcare Power of [place]; Living will Copy of Healthcare Power of Attorney in Chart?: No - copy requested Copy of Living Will in Chart?: No - copy requested Would patient like information on creating a medical advance directive?: No - Patient declined  Reviewed/Updated  Reviewed/Updated: Reviewed All (Medical, Surgical, Family, Medications, Allergies, Care Teams, Patient Goals)    Allergies (verified) Bupropion hcl, Lovastatin, and Uroxatral  [alfuzosin ]   Current Medications (verified) Outpatient Encounter Medications as of 12/19/2024  Medication Sig    cetirizine  (ZYRTEC ) 10 MG tablet Take  1 tablet (10 mg total) by mouth daily.   Cholecalciferol  (VITAMIN D ) 2000 UNITS CAPS Take 1 capsule by mouth daily.   cyanocobalamin (VITAMIN B12) 1000 MCG tablet    fluticasone  (FLONASE ) 50 MCG/ACT nasal spray Place 2 sprays into both nostrils daily.   Glucosamine-Chondroit-Vit C-Mn (GLUCOSAMINE 1500 COMPLEX) CAPS Take by mouth daily at 2 PM.   guaiFENesin  (ROBITUSSIN) 100 MG/5ML liquid Take 10 mLs by mouth every 6 (six) hours as needed for cough or to loosen phlegm.   Omega-3 Fatty Acids (FISH OIL) 1000 MG CAPS 1,200 mg daily at 2 PM.   pravastatin  (PRAVACHOL ) 20 MG tablet Take 1 tablet (20 mg total) by mouth daily.   tamsulosin  (FLOMAX ) 0.4 MG CAPS capsule Take 1 capsule (0.4 mg total) by mouth 2 (two) times daily.   No facility-administered encounter medications on file as of 12/19/2024.    History: Past Medical History:  Diagnosis Date   Bilateral carotid bruits    us  carotid bilateral 05-14-2022 epic   Cataract 2020   Removed   Chronic kidney disease stage 4    Colon polyps    Coronary artery calcification seen on CT scan    Hyperlipidemia    Hypertension    per pt on 08-25-2022 off amlodipine  last 3 to 4 months per nephrology dr leeanne   Osteoarthritis    Prostate cancer [facility])    RBBB    Past Surgical History:  Procedure Laterality Date   APPENDECTOMY  2017   In conjunction with partial colectomy   COLON SURGERY     COLONOSCOPY     CYSTOSCOPY N/A 09/03/2022   Procedure: CYSTOSCOPY;  Surgeon: Sherrilee Belvie CROME, MD;  Location: [facility];  Service: Urology;  Laterality: N/A;   EYE SURGERY  2020   Cataracts removed and corneal implant   INGUINAL HERNIA REPAIR  2005   Right   LESION REMOVAL Left 09/23/2020   Procedure: MINOR EXICISION OF LESION OF LEFT EAR;  Surgeon: Jesus Oliphant, MD;  Location: [facility];  Service: ENT;  Laterality: Left;   PARTIAL COLECTOMY Right 12/16/2015   Procedure: PARTIAL  CECECTOMY AND APPENDECTOMY;  Surgeon: Lynwood Pina, MD;  Location: [facility] OR;  Service: General;  Laterality: Right;   POLYPECTOMY     RADIOACTIVE SEED IMPLANT N/A 09/03/2022   Procedure: RADIOACTIVE SEED IMPLANT/BRACHYTHERAPY IMPLANT;  Surgeon: Sherrilee Belvie CROME, MD;  Location: [facility];  Service: Urology;  Laterality: N/A;   SPACE OAR INSTILLATION N/A 09/03/2022   Procedure: SPACE OAR INSTILLATION;  Surgeon: Sherrilee Belvie CROME, MD;  Location: [facility];  Service: Urology;  Laterality: N/A;   VASECTOMY     Family History  Problem Relation Age of Onset   Diverticulitis Mother    Hypertension Father    Cancer Father        brain ca   Kidney disease Brother 55   Colon cancer Brother    Rectal cancer Neg Hx    Stomach cancer Neg Hx    Esophageal cancer Neg Hx    Social History   Occupational History   Occupation: retired  Tobacco Use   Smoking status: Former    Current packs/day: 0.00    Average packs/day: 0.5 packs/day for 10.0 years (5.0 ttl pk-yrs)    Types: Cigarettes    Start date: 12/15/1955    Quit date: 12/14/1965    Years since quitting: 59.0   Smokeless tobacco: Never  Vaping Use   Vaping status: Never Used  Substance and Sexual Activity   Alcohol use: Not Currently   Drug use: No   Sexual activity: Not Currently   Tobacco Counseling Counseling given: Not Answered  SDOH Screenings   Food Insecurity: No Food Insecurity (12/19/2024)  Housing: Unknown (12/19/2024)  Transportation Needs: No Transportation Needs (12/19/2024)  Utilities: Not At Risk (12/19/2024)  Alcohol Screen: Low Risk (12/27/2023)  Depression (PHQ2-9): Low Risk (12/19/2024)  Financial Resource Strain: Low Risk (09/15/2024)  Physical Activity: Insufficiently Active (12/19/2024)  Social Connections: Moderately Isolated (12/19/2024)  Stress: Stress Concern Present (12/19/2024)  Tobacco Use: Medium Risk (12/19/2024)  Health Literacy: Adequate Health Literacy (12/19/2024)    See flowsheets for full screening details  Depression Screen PHQ 2 & 9 Depression Scale- Over the past 2 weeks, how often have you been bothered by any of the following problems? Little interest or pleasure in doing things: 0 Feeling down, depressed, or hopeless (PHQ Adolescent also includes...irritable): 0 PHQ-2 Total Score: 0 Trouble falling or staying asleep, or sleeping too much: 0 (staying asleep-due to using he bathroom) Feeling tired or having little energy: 0 Poor appetite or overeating (PHQ Adolescent also includes...weight loss): 0 Feeling bad about yourself - or that you are a failure or have let yourself or your family down: 0 Trouble concentrating on things, such as reading the newspaper or watching television (PHQ Adolescent also includes...like school work): 0 Moving or speaking so slowly that other people could have noticed. Or the opposite - being so fidgety or restless that you have been moving around a lot more than usual: 0 Thoughts that you would be better off dead, or of hurting yourself in some way: 0 PHQ-9 Total Score: 0 If you checked off any problems, how difficult have these problems made it for you to do your work, take care of things at home, or get along with other people?: Not difficult at all  Depression Treatment Depression Interventions/Treatment : EYV7-0 Score <4 Follow-up Not Indicated     Goals Addressed   None          Objective:    Today's Vitals   12/19/24 1440  Weight: 133 lb (60.3 kg)  Height: 5' 5 (1.651 m)   Body mass index is 22.13 kg/m.  Hearing/Vision screen Hearing Screening - Comments:: Denies hearing difficulties   Vision Screening - Comments:: Has implants/Denies vision issues.[facility] ophthalmology/not UTD  Immunizations and Health Maintenance Health Maintenance  Topic Date Due   COVID-19 Vaccine (6 - 2025-26 season) 07/24/2024   Medicare Annual Wellness (AWV)  12/19/2025   DTaP/Tdap/Td (3 - Td or Tdap)  02/18/2031   Pneumococcal Vaccine: 50+ Years  Completed   Influenza Vaccine  Completed   Meningococcal B Vaccine  Aged Out   Mammogram  Discontinued   Colonoscopy  Discontinued   Zoster Vaccines- Shingrix  Discontinued        Assessment/Plan:  This is a routine wellness examination for Keddrick.  Patient Care Team: Plotnikov, Karlynn GAILS, MD as PCP - General Debera Jayson MATSU, MD as PCP - Cardiology (Cardiology) Debera Jayson MATSU, MD as Consulting Physician (Cardiology) Vertell Pont, RN as Oncology Nurse Navigator Rayburn Pac, MD (Inactive) as Consulting Physician (Nephrology) Sherrilee Belvie CROME, MD as Consulting Physician (Urology)  I have personally reviewed and noted the following in the patients chart:   Medical and social history Use of alcohol, tobacco or illicit drugs  Current medications and supplements including opioid prescriptions. Functional ability and status Nutritional status Physical activity Advanced directives List of other physicians Hospitalizations,  surgeries, and ER visits in previous 12 months Vitals Screenings to include cognitive, depression, and falls Referrals and appointments  No orders of the defined types were placed in this encounter.  In addition, I have reviewed and discussed with patient certain preventive protocols, quality metrics, and best practice recommendations. A written personalized care plan for preventive services as well as general preventive health recommendations were provided to patient.   Cincere Deprey L Oluwadamilola Rosamond, CMA   12/19/2024   Return in 1 year (on 12/19/2025).  After Visit Summary: (MyChart) Due to this being a telephonic visit, the after visit summary with patients personalized plan was offered to patient via MyChart   Nurse Notes: No voiced or noted concerns at this time. "

## 2024-12-20 ENCOUNTER — Encounter: Payer: Self-pay | Admitting: Internal Medicine

## 2024-12-20 ENCOUNTER — Ambulatory Visit: Admitting: Internal Medicine

## 2024-12-20 VITALS — BP 110/64 | HR 69 | Temp 98.7°F | Ht 65.0 in | Wt 136.0 lb

## 2024-12-20 DIAGNOSIS — N320 Bladder-neck obstruction: Secondary | ICD-10-CM | POA: Diagnosis not present

## 2024-12-20 DIAGNOSIS — I10 Essential (primary) hypertension: Secondary | ICD-10-CM

## 2024-12-20 DIAGNOSIS — G4700 Insomnia, unspecified: Secondary | ICD-10-CM | POA: Diagnosis not present

## 2024-12-20 DIAGNOSIS — R42 Dizziness and giddiness: Secondary | ICD-10-CM | POA: Diagnosis not present

## 2024-12-20 DIAGNOSIS — R55 Syncope and collapse: Secondary | ICD-10-CM | POA: Diagnosis not present

## 2024-12-20 MED ORDER — HYDROXYZINE HCL 25 MG PO TABS: 25.0000 mg | ORAL_TABLET | Freq: Every evening | ORAL | 5 refills | Status: AC | PRN

## 2024-12-20 NOTE — Assessment & Plan Note (Signed)
" °  Recent ER visit on 12/10/24: s/p fall. States he went to the bathroom, turned around and lost his balance and fell hitting his head on marble. He states he did not lose consciousness. He states he is otherwise feeling well. He denies any other symptoms or concerns at this time. Imaging negative. She did not lose conscious. Lab work and urine are unremarkable. Laceration repaired as above. He Toller procedure well. Advised Tylenol  as needed for pain and close follow-up with primary care doctor.  On Tamsulosyn bid Try at bedtime (every day) due to recent syncope. No orthostatic sx's Hydrate well "

## 2024-12-20 NOTE — Assessment & Plan Note (Signed)
On NAS

## 2024-12-20 NOTE — Patient Instructions (Addendum)
" ° °  EZPIK Lotion Applicator for Back, Extendable 23, Curved Handle for Seniors and Limited Mobility, Barrister's Clerk for Creams, Oils, and Skin Care, 0 of 9   "

## 2024-12-20 NOTE — Progress Notes (Signed)
 "  Subjective:  Patient ID: Sergio Hughes, male    DOB: 08-Aug-1941  Age: 84 y.o. MRN: [id number]  CC: Follow-up (Having trouble sleeping and having back aches )   HPI Sergio Hughes presents for HTN, CAD, HTN  Recent ER visit on 12/10/24: s/p fall. States he went to the bathroom, turned around and lost his balance and fell hitting his head on marble. He states he did not lose consciousness. He states he is otherwise feeling well. He denies any other symptoms or concerns at this time. Imaging negative. She did not lose conscious. Lab work and urine are unremarkable. Laceration repaired as above. He Toller procedure well. Advised Tylenol  as needed for pain and close follow-up with primary care doctor.   C/o insomnia: BPH, back is itching...getting up a lot  Outpatient Medications Prior to Visit  Medication Sig Dispense Refill   cetirizine  (ZYRTEC ) 10 MG tablet Take 1 tablet (10 mg total) by mouth daily. 30 tablet 0   Cholecalciferol  (VITAMIN D ) 2000 UNITS CAPS Take 1 capsule by mouth daily.     cyanocobalamin (VITAMIN B12) 1000 MCG tablet      fluticasone  (FLONASE ) 50 MCG/ACT nasal spray Place 2 sprays into both nostrils daily. 16 g 6   Glucosamine-Chondroit-Vit C-Mn (GLUCOSAMINE 1500 COMPLEX) CAPS Take by mouth daily at 2 PM.     guaiFENesin  (ROBITUSSIN) 100 MG/5ML liquid Take 10 mLs by mouth every 6 (six) hours as needed for cough or to loosen phlegm. 300 mL 0   Omega-3 Fatty Acids (FISH OIL) 1000 MG CAPS 1,200 mg daily at 2 PM.     pravastatin  (PRAVACHOL ) 20 MG tablet Take 1 tablet (20 mg total) by mouth daily. 90 tablet 3   tamsulosin  (FLOMAX ) 0.4 MG CAPS capsule Take 1 capsule (0.4 mg total) by mouth 2 (two) times daily. 180 capsule 3   No facility-administered medications prior to visit.    ROS: Review of Systems  Constitutional:  Negative for appetite change, fatigue and unexpected weight change.  HENT:  Negative for congestion, nosebleeds, sneezing, sore throat and trouble  swallowing.   Eyes:  Negative for itching and visual disturbance.  Respiratory:  Negative for cough.   Cardiovascular:  Negative for chest pain, palpitations and leg swelling.  Gastrointestinal:  Negative for abdominal distention, blood in stool, diarrhea and nausea.  Genitourinary:  Negative for frequency and hematuria.  Musculoskeletal:  Positive for arthralgias. Negative for back pain, gait problem, joint swelling and neck pain.  Skin:  Negative for rash.  Neurological:  Negative for dizziness, tremors, speech difficulty and weakness.  Psychiatric/Behavioral:  Negative for agitation, dysphoric mood, sleep disturbance and suicidal ideas. The patient is not nervous/anxious.     Objective:  BP 110/64   Pulse 69   Temp 98.7 F (37.1 C) (Oral)   Ht 5' 5 (1.651 m)   Wt 136 lb (61.7 kg)   SpO2 99%   BMI 22.63 kg/m   BP Readings from Last 3 Encounters:  12/20/24 110/64  12/10/24 116/63  11/20/24 106/63    Wt Readings from Last 3 Encounters:  12/20/24 136 lb (61.7 kg)  12/19/24 133 lb (60.3 kg)  12/10/24 133 lb (60.3 kg)    Physical Exam Constitutional:      General: He is not in acute distress.    Appearance: Normal appearance. He is well-developed.     Comments: NAD  Eyes:     Conjunctiva/sclera: Conjunctivae normal.     Pupils: Pupils are equal, round, and reactive  to light.  Neck:     Thyroid : No thyromegaly.     Vascular: No JVD.  Cardiovascular:     Rate and Rhythm: Normal rate and regular rhythm.     Heart sounds: Normal heart sounds. No murmur heard.    No friction rub. No gallop.  Pulmonary:     Effort: Pulmonary effort is normal. No respiratory distress.     Breath sounds: Normal breath sounds. No wheezing or rales.  Chest:     Chest wall: No tenderness.  Abdominal:     General: Bowel sounds are normal. There is no distension.     Palpations: Abdomen is soft. There is no mass.     Tenderness: There is no abdominal tenderness. There is no guarding or  rebound.  Musculoskeletal:        General: No tenderness. Normal range of motion.     Cervical back: Normal range of motion.  Lymphadenopathy:     Cervical: No cervical adenopathy.  Skin:    General: Skin is warm and dry.     Findings: No rash.  Neurological:     Mental Status: He is alert and oriented to person, place, and time.     Cranial Nerves: No cranial nerve deficit.     Motor: No abnormal muscle tone.     Coordination: Coordination normal.     Gait: Gait normal.     Deep Tendon Reflexes: Reflexes are normal and symmetric.  Psychiatric:        Behavior: Behavior normal.        Thought Content: Thought content normal.        Judgment: Judgment normal.     Lab Results  Component Value Date   WBC 9.3 12/10/2024   HGB 11.5 (L) 12/10/2024   HCT 34.8 (L) 12/10/2024   PLT 187 12/10/2024   GLUCOSE 129 (H) 12/10/2024   CHOL 141 02/16/2024   TRIG 62.0 02/16/2024   HDL 57.60 02/16/2024   LDLDIRECT 144.9 11/14/2013   LDLCALC 71 02/16/2024   ALT 15 12/10/2024   AST 24 12/10/2024   NA 139 12/10/2024   K 3.8 12/10/2024   CL 102 12/10/2024   CREATININE 3.20 (H) 12/10/2024   BUN 81 (H) 12/10/2024   CO2 24 12/10/2024   TSH 0.76 02/16/2024   PSA 11.03 (H) 10/23/2021   INR 1.02 12/09/2015   HGBA1C 5.8 (H) 12/09/2015    CT Cervical Spine Wo Contrast Result Date: 12/10/2024 EXAM: CT CERVICAL SPINE WITHOUT CONTRAST 12/10/2024 12:00:43 PM TECHNIQUE: CT of the cervical spine was performed without the administration of intravenous contrast. Multiplanar reformatted images are provided for review. Automated exposure control, iterative reconstruction, and/or weight based adjustment of the mA/kV was utilized to reduce the radiation dose to as low as reasonably achievable. COMPARISON: None available. CLINICAL HISTORY: fall, hit head neck pain FINDINGS: BONES AND ALIGNMENT: The cervical vertebral bodies are fused C2-C7. This appears congenital. Incomplete fusion of the anterior arch of C1  is noted. Significant levoconvex curvature is present in the upper thoracic spine. A butterfly vertebral body is present at T2-T3. No acute fracture or traumatic malalignment. DEGENERATIVE CHANGES: No significant degenerative changes. SOFT TISSUES: No prevertebral soft tissue swelling. IMPRESSION: 1. No acute findings. 2. 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 3. Incomplete fusion of the anterior arch of C1. 4. 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 5. Significant levoconvex curvature in the upper thoracic spine. Electronically signed by: Lonni Necessary MD 12/10/2024 12:08 PM EST RP Workstation:  [id number]   CT Head Wo Contrast Result Date: 12/10/2024 EXAM: CT HEAD WITHOUT CONTRAST 12/10/2024 12:00:43 PM TECHNIQUE: CT of the head was performed without the administration of intravenous contrast. Automated exposure control, iterative reconstruction, and/or weight based adjustment of the mA/kV was utilized to reduce the radiation dose to as low as reasonably achievable. COMPARISON: None available. CLINICAL HISTORY: Fall, hit head. Syncopal episode today. FINDINGS: BRAIN AND VENTRICLES: No acute hemorrhage. No evidence of acute infarct. No hydrocephalus. No extra-axial collection. No mass effect or midline shift. Moderate generalized atrophy and white matter disease is present. ORBITS: Bilateral lens replacements are noted. The globes and orbits are otherwise within normal limits. SINUSES: No acute abnormality. SOFT TISSUES AND SKULL: Laceration over the nose. Soft tissue swelling is present at the glabella. No underlying fracture or foreign body is present. No skull fracture. IMPRESSION: 1. No acute intracranial abnormality. 2. Soft tissue swelling at the glabella without underlying fracture or foreign body. Electronically signed by: Lonni Necessary MD 12/10/2024 12:06 PM EST RP Workstation: [id number]    Assessment & Plan:   Problem List Items Addressed This Visit     Essential  hypertension   On NAS      Dizziness   On Tamsulosyn bid Try at bedtime (every day) due to recent syncope. No orthostatic sx's      Bladder neck obstruction - Primary   On Tamsulosyn bid Try at bedtime (every day) due to recent syncope. No orthostatic sx's      Near syncope    Recent ER visit on 12/10/24: s/p fall. States he went to the bathroom, turned around and lost his balance and fell hitting his head on marble. He states he did not lose consciousness. He states he is otherwise feeling well. He denies any other symptoms or concerns at this time. Imaging negative. She did not lose conscious. Lab work and urine are unremarkable. Laceration repaired as above. He Toller procedure well. Advised Tylenol  as needed for pain and close follow-up with primary care doctor.  On Tamsulosyn bid Try at bedtime (every day) due to recent syncope. No orthostatic sx's Hydrate well      Insomnia disorder   Insomnia: BPH, back is itching...getting up a lot Will try hydroxyzine  at HS         Meds ordered this encounter  Medications   hydrOXYzine  (ATARAX ) 25 MG tablet    Sig: Take 1-2 tablets (25-50 mg total) by mouth at bedtime and may repeat dose one time if needed.    Dispense:  60 tablet    Refill:  5      Follow-up: No follow-ups on file.  Marolyn Noel, MD "

## 2024-12-20 NOTE — Assessment & Plan Note (Signed)
 Insomnia: BPH, back is itching...getting up a lot Will try hydroxyzine  at HS

## 2024-12-20 NOTE — Assessment & Plan Note (Signed)
 On Tamsulosyn bid Try at bedtime (every day) due to recent syncope. No orthostatic sx's

## 2025-01-29 ENCOUNTER — Other Ambulatory Visit

## 2025-02-12 ENCOUNTER — Ambulatory Visit: Admitting: Urology

## 2025-03-22 ENCOUNTER — Ambulatory Visit: Admitting: Internal Medicine
